# Patient Record
Sex: Female | Born: 1980 | Race: White | Hispanic: No | Marital: Married | Smoking: Current every day smoker
Health system: Southern US, Community
[De-identification: ages and names within clinical notes are randomized; demographics above are authoritative.]

## PROBLEM LIST (undated history)

## (undated) DIAGNOSIS — F329 Major depressive disorder, single episode, unspecified: Secondary | ICD-10-CM

## (undated) DIAGNOSIS — F32A Depression, unspecified: Secondary | ICD-10-CM

## (undated) DIAGNOSIS — E785 Hyperlipidemia, unspecified: Secondary | ICD-10-CM

## (undated) DIAGNOSIS — F4310 Post-traumatic stress disorder, unspecified: Secondary | ICD-10-CM

## (undated) DIAGNOSIS — F419 Anxiety disorder, unspecified: Secondary | ICD-10-CM

## (undated) DIAGNOSIS — Z9889 Other specified postprocedural states: Secondary | ICD-10-CM

## (undated) DIAGNOSIS — I420 Dilated cardiomyopathy: Secondary | ICD-10-CM

## (undated) DIAGNOSIS — E119 Type 2 diabetes mellitus without complications: Secondary | ICD-10-CM

## (undated) DIAGNOSIS — D071 Carcinoma in situ of vulva: Secondary | ICD-10-CM

## (undated) DIAGNOSIS — U071 COVID-19: Secondary | ICD-10-CM

## (undated) DIAGNOSIS — D649 Anemia, unspecified: Secondary | ICD-10-CM

## (undated) DIAGNOSIS — N809 Endometriosis, unspecified: Secondary | ICD-10-CM

## (undated) DIAGNOSIS — K219 Gastro-esophageal reflux disease without esophagitis: Secondary | ICD-10-CM

## (undated) DIAGNOSIS — Z8614 Personal history of Methicillin resistant Staphylococcus aureus infection: Secondary | ICD-10-CM

## (undated) DIAGNOSIS — I493 Ventricular premature depolarization: Secondary | ICD-10-CM

## (undated) DIAGNOSIS — T8859XA Other complications of anesthesia, initial encounter: Secondary | ICD-10-CM

## (undated) DIAGNOSIS — Q249 Congenital malformation of heart, unspecified: Secondary | ICD-10-CM

## (undated) DIAGNOSIS — Z8489 Family history of other specified conditions: Secondary | ICD-10-CM

## (undated) DIAGNOSIS — I10 Essential (primary) hypertension: Secondary | ICD-10-CM

## (undated) DIAGNOSIS — R112 Nausea with vomiting, unspecified: Secondary | ICD-10-CM

## (undated) DIAGNOSIS — J45909 Unspecified asthma, uncomplicated: Secondary | ICD-10-CM

## (undated) DIAGNOSIS — F319 Bipolar disorder, unspecified: Secondary | ICD-10-CM

## (undated) HISTORY — PX: TUBAL LIGATION: SHX77

## (undated) HISTORY — PX: APPENDECTOMY: SHX54

## (undated) HISTORY — DX: Congenital malformation of heart, unspecified: Q24.9

## (undated) HISTORY — PX: ABDOMINAL HYSTERECTOMY: SHX81

---

## 2004-04-25 ENCOUNTER — Observation Stay: Payer: Self-pay

## 2004-06-22 ENCOUNTER — Inpatient Hospital Stay: Payer: Self-pay | Admitting: Certified Nurse Midwife

## 2005-01-07 ENCOUNTER — Emergency Department: Payer: Self-pay | Admitting: Emergency Medicine

## 2005-04-14 ENCOUNTER — Emergency Department: Payer: Self-pay | Admitting: Emergency Medicine

## 2007-09-21 ENCOUNTER — Emergency Department: Payer: Self-pay | Admitting: Emergency Medicine

## 2007-09-21 ENCOUNTER — Other Ambulatory Visit: Payer: Self-pay

## 2008-02-10 ENCOUNTER — Emergency Department: Payer: Self-pay | Admitting: Emergency Medicine

## 2008-10-01 ENCOUNTER — Emergency Department: Payer: Self-pay | Admitting: Emergency Medicine

## 2008-12-01 ENCOUNTER — Emergency Department: Payer: Self-pay | Admitting: Emergency Medicine

## 2008-12-06 ENCOUNTER — Emergency Department: Payer: Self-pay | Admitting: Emergency Medicine

## 2008-12-08 ENCOUNTER — Inpatient Hospital Stay: Payer: Self-pay | Admitting: Unknown Physician Specialty

## 2009-01-04 ENCOUNTER — Ambulatory Visit: Payer: Self-pay | Admitting: Gastroenterology

## 2009-01-30 ENCOUNTER — Observation Stay: Payer: Self-pay | Admitting: Surgery

## 2009-03-06 ENCOUNTER — Ambulatory Visit: Payer: Self-pay | Admitting: Nurse Practitioner

## 2009-06-05 ENCOUNTER — Emergency Department: Payer: Self-pay | Admitting: Emergency Medicine

## 2009-07-11 ENCOUNTER — Emergency Department: Payer: Self-pay | Admitting: Emergency Medicine

## 2009-07-21 ENCOUNTER — Ambulatory Visit: Payer: Self-pay | Admitting: Nurse Practitioner

## 2009-08-28 ENCOUNTER — Emergency Department: Payer: Self-pay | Admitting: Emergency Medicine

## 2009-08-29 ENCOUNTER — Ambulatory Visit: Payer: Self-pay | Admitting: Obstetrics and Gynecology

## 2009-10-25 ENCOUNTER — Ambulatory Visit: Payer: Self-pay | Admitting: Unknown Physician Specialty

## 2009-10-26 ENCOUNTER — Ambulatory Visit: Payer: Self-pay | Admitting: Unknown Physician Specialty

## 2009-10-31 LAB — PATHOLOGY REPORT

## 2009-12-14 ENCOUNTER — Emergency Department: Payer: Self-pay | Admitting: Emergency Medicine

## 2010-01-17 ENCOUNTER — Ambulatory Visit: Payer: Self-pay | Admitting: Unknown Physician Specialty

## 2010-01-30 ENCOUNTER — Inpatient Hospital Stay: Payer: Self-pay

## 2010-02-02 LAB — PATHOLOGY REPORT

## 2010-05-02 ENCOUNTER — Emergency Department: Payer: Self-pay | Admitting: Emergency Medicine

## 2010-07-06 ENCOUNTER — Ambulatory Visit: Payer: Self-pay

## 2010-09-14 ENCOUNTER — Emergency Department: Payer: Self-pay | Admitting: Emergency Medicine

## 2010-10-29 ENCOUNTER — Emergency Department: Payer: Self-pay | Admitting: Emergency Medicine

## 2011-01-15 ENCOUNTER — Emergency Department: Payer: Self-pay | Admitting: Emergency Medicine

## 2011-02-13 ENCOUNTER — Ambulatory Visit: Payer: Self-pay | Admitting: General Practice

## 2011-02-14 ENCOUNTER — Other Ambulatory Visit: Payer: Self-pay | Admitting: Physician Assistant

## 2011-04-03 ENCOUNTER — Ambulatory Visit: Payer: Self-pay

## 2011-04-24 ENCOUNTER — Other Ambulatory Visit: Payer: Self-pay | Admitting: Physician Assistant

## 2011-05-15 ENCOUNTER — Ambulatory Visit: Payer: Self-pay | Admitting: Gastroenterology

## 2011-05-16 ENCOUNTER — Ambulatory Visit: Payer: Self-pay | Admitting: Gastroenterology

## 2011-06-07 ENCOUNTER — Emergency Department: Payer: Self-pay | Admitting: *Deleted

## 2011-06-07 LAB — CBC WITH DIFFERENTIAL/PLATELET
Basophil %: 1.1 %
Eosinophil #: 0.2 10*3/uL (ref 0.0–0.7)
MCHC: 34.2 g/dL (ref 32.0–36.0)
Monocyte #: 0.7 10*3/uL (ref 0.0–0.7)
Neutrophil %: 65.3 %
RBC: 4.34 10*6/uL (ref 3.80–5.20)
RDW: 13.2 % (ref 11.5–14.5)
WBC: 9.4 10*3/uL (ref 3.6–11.0)

## 2011-06-07 LAB — PREGNANCY, URINE: Pregnancy Test, Urine: NEGATIVE m[IU]/mL

## 2011-06-07 LAB — COMPREHENSIVE METABOLIC PANEL
Anion Gap: 7 (ref 7–16)
BUN: 12 mg/dL (ref 7–18)
Chloride: 105 mmol/L (ref 98–107)
Creatinine: 0.91 mg/dL (ref 0.60–1.30)
EGFR (African American): >60
Glucose: 102 mg/dL — ABNORMAL HIGH (ref 65–99)
Potassium: 3.9 mmol/L (ref 3.5–5.1)
Sodium: 139 mmol/L (ref 136–145)

## 2011-06-07 LAB — MONONUCLEOSIS SCREEN: Mono Test: NEGATIVE

## 2011-07-23 ENCOUNTER — Encounter: Payer: Self-pay | Admitting: Orthopedic Surgery

## 2011-08-21 ENCOUNTER — Encounter: Payer: Self-pay | Admitting: Orthopedic Surgery

## 2011-09-19 ENCOUNTER — Emergency Department: Payer: Self-pay | Admitting: Emergency Medicine

## 2011-09-19 LAB — URINALYSIS, COMPLETE
Bilirubin,UR: NEGATIVE
Blood: NEGATIVE
Glucose,UR: NEGATIVE mg/dL (ref 0–75)
Ph: 5 (ref 4.5–8.0)
RBC,UR: 1 /HPF (ref 0–5)
Squamous Epithelial: 7
WBC UR: 2 /HPF (ref 0–5)

## 2011-09-19 LAB — COMPREHENSIVE METABOLIC PANEL
Albumin: 3.8 g/dL (ref 3.4–5.0)
Alkaline Phosphatase: 77 U/L (ref 50–136)
BUN: 13 mg/dL (ref 7–18)
Chloride: 105 mmol/L (ref 98–107)
Creatinine: 0.69 mg/dL (ref 0.60–1.30)
EGFR (African American): >60
Glucose: 90 mg/dL (ref 65–99)
Osmolality: 279 (ref 275–301)
Potassium: 3.6 mmol/L (ref 3.5–5.1)
Total Protein: 7.7 g/dL (ref 6.4–8.2)

## 2011-09-19 LAB — HCG, QUANTITATIVE, PREGNANCY: Beta Hcg, Quant.: <1 m[IU]/mL — ABNORMAL LOW

## 2011-09-19 LAB — CBC
HGB: 12.8 g/dL (ref 12.0–16.0)
MCH: 28.7 pg (ref 26.0–34.0)
MCHC: 33 g/dL (ref 32.0–36.0)

## 2011-09-26 ENCOUNTER — Emergency Department: Payer: Self-pay | Admitting: *Deleted

## 2011-09-26 LAB — URINALYSIS, COMPLETE
Bacteria: NONE SEEN
Ketone: NEGATIVE
Nitrite: NEGATIVE
Ph: 5 (ref 4.5–8.0)
Protein: NEGATIVE
RBC,UR: 4 /HPF (ref 0–5)
Specific Gravity: 1.02 (ref 1.003–1.030)
Squamous Epithelial: 3

## 2011-09-26 LAB — CBC
HGB: 12.1 g/dL (ref 12.0–16.0)
MCHC: 33.5 g/dL (ref 32.0–36.0)
MCV: 87 fL (ref 80–100)
Platelet: 322 10*3/uL (ref 150–440)
RBC: 4.17 10*6/uL (ref 3.80–5.20)
RDW: 13.9 % (ref 11.5–14.5)
WBC: 10.9 10*3/uL (ref 3.6–11.0)

## 2011-09-26 LAB — COMPREHENSIVE METABOLIC PANEL
Albumin: 3.9 g/dL (ref 3.4–5.0)
Alkaline Phosphatase: 85 U/L (ref 50–136)
BUN: 11 mg/dL (ref 7–18)
Calcium, Total: 9.1 mg/dL (ref 8.5–10.1)
Chloride: 105 mmol/L (ref 98–107)
Co2: 27 mmol/L (ref 21–32)
Creatinine: 0.8 mg/dL (ref 0.60–1.30)
EGFR (Non-African Amer.): >60
Glucose: 84 mg/dL (ref 65–99)
Osmolality: 278 (ref 275–301)
Potassium: 3.8 mmol/L (ref 3.5–5.1)
SGOT(AST): 28 U/L (ref 15–37)

## 2011-09-26 LAB — MONONUCLEOSIS SCREEN: Mono Test: NEGATIVE

## 2011-11-28 ENCOUNTER — Other Ambulatory Visit: Payer: Self-pay

## 2011-11-28 LAB — CBC WITH DIFFERENTIAL/PLATELET
Basophil #: 0.1 10*3/uL (ref 0.0–0.1)
Eosinophil #: 0.1 10*3/uL (ref 0.0–0.7)
Lymphocyte %: 33.8 %
MCHC: 33.5 g/dL (ref 32.0–36.0)
Monocyte #: 0.5 x10 3/mm (ref 0.2–0.9)
Neutrophil %: 58.4 %
RDW: 13.6 % (ref 11.5–14.5)

## 2011-11-28 LAB — LIPID PANEL
HDL Cholesterol: 36 mg/dL — ABNORMAL LOW (ref 40–60)
Triglycerides: 149 mg/dL (ref 0–200)
VLDL Cholesterol, Calc: 30 mg/dL (ref 5–40)

## 2011-11-28 LAB — COMPREHENSIVE METABOLIC PANEL
Albumin: 3.8 g/dL (ref 3.4–5.0)
Alkaline Phosphatase: 92 U/L (ref 50–136)
BUN: 10 mg/dL (ref 7–18)
Calcium, Total: 9 mg/dL (ref 8.5–10.1)
Creatinine: 0.74 mg/dL (ref 0.60–1.30)
Glucose: 84 mg/dL (ref 65–99)
Potassium: 4.1 mmol/L (ref 3.5–5.1)
SGOT(AST): 21 U/L (ref 15–37)
SGPT (ALT): 31 U/L (ref 12–78)
Sodium: 141 mmol/L (ref 136–145)
Total Protein: 7.6 g/dL (ref 6.4–8.2)

## 2011-12-12 ENCOUNTER — Ambulatory Visit: Payer: Self-pay | Admitting: Orthopedic Surgery

## 2011-12-16 ENCOUNTER — Emergency Department: Payer: Self-pay | Admitting: Emergency Medicine

## 2012-03-10 ENCOUNTER — Emergency Department: Payer: Self-pay | Admitting: Emergency Medicine

## 2012-03-10 LAB — CBC
HGB: 12.5 g/dL (ref 12.0–16.0)
MCH: 30.3 pg (ref 26.0–34.0)
MCHC: 34.7 g/dL (ref 32.0–36.0)
Platelet: 262 10*3/uL (ref 150–440)
RDW: 14.1 % (ref 11.5–14.5)

## 2012-03-10 LAB — COMPREHENSIVE METABOLIC PANEL
BUN: 11 mg/dL (ref 7–18)
Bilirubin,Total: 0.3 mg/dL (ref 0.2–1.0)
Chloride: 105 mmol/L (ref 98–107)
Co2: 29 mmol/L (ref 21–32)
Creatinine: 0.66 mg/dL (ref 0.60–1.30)
EGFR (African American): >60
EGFR (Non-African Amer.): >60
Glucose: 101 mg/dL — ABNORMAL HIGH (ref 65–99)
Osmolality: 281 (ref 275–301)
Potassium: 3.8 mmol/L (ref 3.5–5.1)
Sodium: 141 mmol/L (ref 136–145)

## 2012-03-10 LAB — URINALYSIS, COMPLETE
Bilirubin,UR: NEGATIVE
Blood: NEGATIVE
Glucose,UR: NEGATIVE mg/dL (ref 0–75)
Specific Gravity: 1.027 (ref 1.003–1.030)
Squamous Epithelial: 1

## 2012-03-10 LAB — LIPASE, BLOOD: Lipase: 152 U/L (ref 73–393)

## 2012-06-02 ENCOUNTER — Other Ambulatory Visit: Payer: Self-pay | Admitting: Psychiatry

## 2012-06-02 LAB — COMPREHENSIVE METABOLIC PANEL
Anion Gap: 6 — ABNORMAL LOW (ref 7–16)
BUN: 12 mg/dL (ref 7–18)
Calcium, Total: 8.6 mg/dL (ref 8.5–10.1)
Co2: 26 mmol/L (ref 21–32)
EGFR (African American): >60
EGFR (Non-African Amer.): >60
SGOT(AST): 30 U/L (ref 15–37)
SGPT (ALT): 47 U/L (ref 12–78)

## 2012-06-02 LAB — CBC WITH DIFFERENTIAL/PLATELET
HCT: 34.8 % — ABNORMAL LOW (ref 35.0–47.0)
HGB: 11.9 g/dL — ABNORMAL LOW (ref 12.0–16.0)
Lymphocyte #: 2.6 10*3/uL (ref 1.0–3.6)
Lymphocyte %: 31.4 %
MCH: 29.3 pg (ref 26.0–34.0)
MCHC: 34.2 g/dL (ref 32.0–36.0)
Monocyte #: 0.8 x10 3/mm (ref 0.2–0.9)
Monocyte %: 9.4 %
Neutrophil #: 4.7 10*3/uL (ref 1.4–6.5)
Neutrophil %: 56.7 %
RDW: 13.9 % (ref 11.5–14.5)

## 2012-06-02 LAB — LIPID PANEL
HDL Cholesterol: 32 mg/dL — ABNORMAL LOW (ref 40–60)
Ldl Cholesterol, Calc: 94 mg/dL (ref 0–100)
Triglycerides: 291 mg/dL — ABNORMAL HIGH (ref 0–200)
VLDL Cholesterol, Calc: 58 mg/dL — ABNORMAL HIGH (ref 5–40)

## 2012-06-02 LAB — VALPROIC ACID LEVEL: Valproic Acid: 73 ug/mL

## 2012-08-12 ENCOUNTER — Emergency Department: Payer: Self-pay | Admitting: Emergency Medicine

## 2012-08-12 LAB — COMPREHENSIVE METABOLIC PANEL
Albumin: 3.3 g/dL — ABNORMAL LOW (ref 3.4–5.0)
Alkaline Phosphatase: 90 U/L (ref 50–136)
Bilirubin,Total: 0.4 mg/dL (ref 0.2–1.0)
Calcium, Total: 8.6 mg/dL (ref 8.5–10.1)
Chloride: 107 mmol/L (ref 98–107)
Co2: 22 mmol/L (ref 21–32)
EGFR (African American): >60
EGFR (Non-African Amer.): >60
Glucose: 99 mg/dL (ref 65–99)
Osmolality: 273 (ref 275–301)
Potassium: 4.3 mmol/L (ref 3.5–5.1)
SGOT(AST): 51 U/L — ABNORMAL HIGH (ref 15–37)
SGPT (ALT): 45 U/L (ref 12–78)
Total Protein: 7.2 g/dL (ref 6.4–8.2)

## 2012-08-12 LAB — URINALYSIS, COMPLETE
Bilirubin,UR: NEGATIVE
Blood: NEGATIVE
Ketone: NEGATIVE
Leukocyte Esterase: NEGATIVE
Nitrite: NEGATIVE
Protein: NEGATIVE
WBC UR: <1 /HPF (ref 0–5)

## 2012-08-12 LAB — CBC
MCH: 28.6 pg (ref 26.0–34.0)
MCHC: 33.6 g/dL (ref 32.0–36.0)
RBC: 4.2 10*6/uL (ref 3.80–5.20)
RDW: 14.4 % (ref 11.5–14.5)
WBC: 8.9 10*3/uL (ref 3.6–11.0)

## 2012-08-12 LAB — LIPASE, BLOOD: Lipase: 146 U/L (ref 73–393)

## 2012-08-13 ENCOUNTER — Encounter (HOSPITAL_COMMUNITY): Payer: Self-pay | Admitting: *Deleted

## 2012-08-13 ENCOUNTER — Emergency Department (HOSPITAL_COMMUNITY)
Admission: EM | Admit: 2012-08-13 | Discharge: 2012-08-14 | Disposition: A | Discharge status: Home / Self Care | Payer: PRIVATE HEALTH INSURANCE | Attending: Emergency Medicine | Admitting: Emergency Medicine

## 2012-08-13 DIAGNOSIS — Z9851 Tubal ligation status: Secondary | ICD-10-CM | POA: Insufficient documentation

## 2012-08-13 DIAGNOSIS — M79609 Pain in unspecified limb: Secondary | ICD-10-CM | POA: Insufficient documentation

## 2012-08-13 DIAGNOSIS — R609 Edema, unspecified: Secondary | ICD-10-CM | POA: Insufficient documentation

## 2012-08-13 DIAGNOSIS — R42 Dizziness and giddiness: Secondary | ICD-10-CM | POA: Insufficient documentation

## 2012-08-13 DIAGNOSIS — R109 Unspecified abdominal pain: Secondary | ICD-10-CM

## 2012-08-13 DIAGNOSIS — R51 Headache: Secondary | ICD-10-CM | POA: Insufficient documentation

## 2012-08-13 DIAGNOSIS — M542 Cervicalgia: Secondary | ICD-10-CM | POA: Insufficient documentation

## 2012-08-13 DIAGNOSIS — F319 Bipolar disorder, unspecified: Secondary | ICD-10-CM | POA: Insufficient documentation

## 2012-08-13 DIAGNOSIS — F411 Generalized anxiety disorder: Secondary | ICD-10-CM | POA: Insufficient documentation

## 2012-08-13 DIAGNOSIS — Z8619 Personal history of other infectious and parasitic diseases: Secondary | ICD-10-CM | POA: Insufficient documentation

## 2012-08-13 DIAGNOSIS — Z8742 Personal history of other diseases of the female genital tract: Secondary | ICD-10-CM | POA: Insufficient documentation

## 2012-08-13 DIAGNOSIS — R112 Nausea with vomiting, unspecified: Secondary | ICD-10-CM | POA: Insufficient documentation

## 2012-08-13 DIAGNOSIS — F3289 Other specified depressive episodes: Secondary | ICD-10-CM | POA: Insufficient documentation

## 2012-08-13 DIAGNOSIS — Z9089 Acquired absence of other organs: Secondary | ICD-10-CM | POA: Insufficient documentation

## 2012-08-13 DIAGNOSIS — J45909 Unspecified asthma, uncomplicated: Secondary | ICD-10-CM | POA: Insufficient documentation

## 2012-08-13 DIAGNOSIS — F329 Major depressive disorder, single episode, unspecified: Secondary | ICD-10-CM | POA: Insufficient documentation

## 2012-08-13 DIAGNOSIS — Z90710 Acquired absence of both cervix and uterus: Secondary | ICD-10-CM | POA: Insufficient documentation

## 2012-08-13 HISTORY — DX: Endometriosis, unspecified: N80.9

## 2012-08-13 HISTORY — DX: Major depressive disorder, single episode, unspecified: F32.9

## 2012-08-13 HISTORY — DX: Anxiety disorder, unspecified: F41.9

## 2012-08-13 HISTORY — DX: Depression, unspecified: F32.A

## 2012-08-13 HISTORY — DX: Unspecified asthma, uncomplicated: J45.909

## 2012-08-13 HISTORY — DX: Bipolar disorder, unspecified: F31.9

## 2012-08-13 LAB — URINALYSIS, ROUTINE W REFLEX MICROSCOPIC
Glucose, UA: NEGATIVE mg/dL
Leukocytes, UA: NEGATIVE
Nitrite: NEGATIVE
Specific Gravity, Urine: 1.016 (ref 1.005–1.030)
pH: 6.5 (ref 5.0–8.0)

## 2012-08-13 NOTE — ED Notes (Signed)
Pt c/o alternating side pain; mainly right flank; x 3 days; started with n/v/d; headache; dizziness; c/o left sided neck stiffness-- can touch chin to chest; minimal pain touching chin to left shoulder; denies dysuria or hematuria

## 2012-08-14 ENCOUNTER — Emergency Department (HOSPITAL_COMMUNITY): Payer: PRIVATE HEALTH INSURANCE

## 2012-08-14 LAB — COMPREHENSIVE METABOLIC PANEL
Albumin: 3.4 g/dL — ABNORMAL LOW (ref 3.5–5.2)
Alkaline Phosphatase: 92 U/L (ref 39–117)
BUN: 7 mg/dL (ref 6–23)
CO2: 25 mEq/L (ref 19–32)
Chloride: 103 mEq/L (ref 96–112)
Glucose, Bld: 121 mg/dL — ABNORMAL HIGH (ref 70–99)
Potassium: 3.9 mEq/L (ref 3.5–5.1)
Total Bilirubin: 0.2 mg/dL — ABNORMAL LOW (ref 0.3–1.2)

## 2012-08-14 LAB — CBC WITH DIFFERENTIAL/PLATELET
Basophils Relative: 0 % (ref 0–1)
Hemoglobin: 11.4 g/dL — ABNORMAL LOW (ref 12.0–15.0)
Lymphocytes Relative: 34 % (ref 12–46)
Lymphs Abs: 3.6 10*3/uL (ref 0.7–4.0)
Monocytes Relative: 7 % (ref 3–12)
Neutro Abs: 5.9 10*3/uL (ref 1.7–7.7)
Neutrophils Relative %: 56 % (ref 43–77)
RBC: 3.98 MIL/uL (ref 3.87–5.11)
WBC: 10.6 10*3/uL — ABNORMAL HIGH (ref 4.0–10.5)

## 2012-08-14 MED ORDER — MORPHINE SULFATE 4 MG/ML IJ SOLN
4.0000 mg | Freq: Once | INTRAMUSCULAR | Status: AC
  Administered 2012-08-14: 4 mg via INTRAVENOUS
  Filled 2012-08-14: qty 1

## 2012-08-14 MED ORDER — HYDROCODONE-ACETAMINOPHEN 5-325 MG PO TABS: 2.0000 | ORAL_TABLET | ORAL | Status: DC | PRN

## 2012-08-14 MED ORDER — OMEPRAZOLE 20 MG PO CPDR: 20.0000 mg | DELAYED_RELEASE_CAPSULE | Freq: Every day | ORAL | Status: DC

## 2012-08-14 MED ORDER — HYDROMORPHONE HCL PF 1 MG/ML IJ SOLN
1.0000 mg | Freq: Once | INTRAMUSCULAR | Status: AC
  Administered 2012-08-14: 1 mg via INTRAVENOUS
  Filled 2012-08-14: qty 1

## 2012-08-14 MED ORDER — PROMETHAZINE HCL 25 MG/ML IJ SOLN
25.0000 mg | Freq: Once | INTRAMUSCULAR | Status: AC
  Administered 2012-08-14: 25 mg via INTRAVENOUS
  Filled 2012-08-14: qty 1

## 2012-08-14 MED ORDER — ONDANSETRON HCL 4 MG/2ML IJ SOLN
4.0000 mg | Freq: Once | INTRAMUSCULAR | Status: AC
  Administered 2012-08-14: 4 mg via INTRAVENOUS
  Filled 2012-08-14: qty 2

## 2012-08-14 MED ORDER — ONDANSETRON HCL 8 MG PO TABS: 8.0000 mg | ORAL_TABLET | Freq: Three times a day (TID) | ORAL | Status: DC | PRN

## 2012-08-14 MED ORDER — SODIUM CHLORIDE 0.9 % IV BOLUS (SEPSIS)
1000.0000 mL | Freq: Once | INTRAVENOUS | Status: AC
  Administered 2012-08-14: 1000 mL via INTRAVENOUS

## 2012-08-14 MED ORDER — SUCRALFATE 1 G PO TABS: 1.0000 g | ORAL_TABLET | Freq: Four times a day (QID) | ORAL | Status: DC

## 2012-08-14 NOTE — ED Provider Notes (Signed)
History     CSN: [id number]  Arrival date & time 08/13/12  2240   First MD Initiated Contact with Patient 08/14/12 0201      Chief Complaint  Patient presents with  . Abdominal Pain    (Consider location/radiation/quality/duration/timing/severity/associated sxs/prior treatment) HPI History provided by pt.   Pt has had severe upper abdominal pain, that migraines from left to right side, for the past 2 days.  Associated w/ N/V that is aggravated by eating, anorexia and diarrhea that has resolved.  Denies fever and GU sx.  Was evaluated at [facility] 2 days ago and CT abd/pelvis showed an enlarged liver.  Was prescribed vicodin and referred to GI.  Went to [facility] last night for second opinion, no imaging was performed and was diagnosed w/ viral infection.  She is here today because she is tired of the pain and can't see GI until end of May.  Past abdominal surgeries include partial hysterectomy and appendectomy. Also c/o pain on top of head.  Severe upon waking this morning but currently minimal.  Associated w/ dizziness; no vision changes, extremity weakness/paresthesias.  Also c/o pain in posterior neck.  Denies trauma to head/neck.   Past Medical History  Diagnosis Date  . Bipolar 1 disorder   . Depression   . Anxiety   . Endometriosis   . Asthma     Past Surgical History  Procedure Laterality Date  . Appendectomy    . Abdominal hysterectomy    . Tubal ligation      No family history on file.  History  Substance Use Topics  . Smoking status: Never Smoker   . Smokeless tobacco: Not on file  . Alcohol Use: No    OB History   Grav Para Term Preterm Abortions TAB SAB Ect Mult Living                  Review of Systems  All other systems reviewed and are negative.    Allergies  Claritin-d 12 hour; Percocet; and Tape  Home Medications   Current Outpatient Rx  Name  Route  Sig  Dispense  Refill  . clonazePAM (KLONOPIN) 1 MG tablet   Oral   Take 1 mg  by mouth 3 (three) times daily.         Marland Kitchen escitalopram (LEXAPRO) 10 MG tablet   Oral   Take 20 mg by mouth daily.         Marland Kitchen HYDROcodone-acetaminophen (NORCO/VICODIN) 5-325 MG per tablet   Oral   Take 1 tablet by mouth every 6 (six) hours as needed for pain.         . promethazine (PHENERGAN) 25 MG tablet   Oral   Take 25 mg by mouth every 8 (eight) hours as needed for nausea.         Marland Kitchen topiramate (TOPAMAX) 25 MG tablet   Oral   Take 25 mg by mouth 3 (three) times daily.         . ziprasidone (GEODON) 40 MG capsule   Oral   Take 40 mg by mouth 2 (two) times daily with a meal.           BP 120/76  Pulse 84  Temp(Src) 98.6 F (37 C) (Oral)  Resp 18  SpO2 99%  Physical Exam  Nursing note and vitals reviewed. Constitutional: She is oriented to person, place, and time. She appears well-developed and well-nourished. No distress.  HENT:  Head: Normocephalic and atraumatic.  Eyes:  Normal appearance  Neck: Normal range of motion.  Cardiovascular: Normal rate and regular rhythm.   Pulmonary/Chest: Effort normal and breath sounds normal. No respiratory distress.  Abdominal: Soft. Bowel sounds are normal. She exhibits no distension and no mass. There is no tenderness. There is no rebound and no guarding.  Epigastric and especially RUQ ttp   Genitourinary:  No CVA tenderness  Musculoskeletal: Normal range of motion.  Neurological: She is alert and oriented to person, place, and time.  Skin: Skin is warm and dry. No rash noted.  Psychiatric: She has a normal mood and affect. Her behavior is normal.    ED Course  Procedures (including critical care time)  Labs Reviewed  CBC WITH DIFFERENTIAL - Abnormal; Notable for the following:    WBC 10.6 (*)    Hemoglobin 11.4 (*)    HCT 34.4 (*)    All other components within normal limits  COMPREHENSIVE METABOLIC PANEL - Abnormal; Notable for the following:    Glucose, Bld 121 (*)    Albumin 3.4 (*)    Total Bilirubin  0.2 (*)    All other components within normal limits  URINALYSIS, ROUTINE W REFLEX MICROSCOPIC  PREGNANCY, URINE   No results found.   1. Abdominal pain   2. Peripheral edema       MDM  32yo F presents w/ RUQ pain + N/V x 2 days.  Had diarrhea but resolved.  Also c/o headache, neck pain and edema all four extremities that started simultaneously.  On exam, afebrile, uncomfortable appearing, tearful, severe RUQ ttp, no focal neuro deficits or meningeal signs, trace non-pitting edema all extremities.  Labs unremarkable.  US abdomen negative for cholelithiasis.  All results discussed w/ patient.  This is her third ED evaluation in three days.  [place] obtained CT that showed hepatomegaly and she was referred to GI.  [facility] diagnosed clinically w/ viral infection.   Cause of edema unclear.  Will refer to [facility] for PCP f/u.  Pain, nausea and headache resolved w/ IVF, phenergan and dilaudid.   D/c'd home w/ more vicodin, zofran and carafate/prilosec to trial for possible gastritis.  Referred to [facility] to see if she can get f/u sooner.  Return precautions discussed.          Otilio Miu, PA-C 08/14/12 [phone]

## 2012-08-14 NOTE — ED Provider Notes (Signed)
Medical screening examination/treatment/procedure(s) were performed by non-physician practitioner and as supervising physician I was immediately available for consultation/collaboration.  Sunnie Nielsen, MD 08/14/12 [phone]

## 2012-08-15 ENCOUNTER — Telehealth (HOSPITAL_COMMUNITY): Payer: Self-pay | Admitting: Emergency Medicine

## 2012-08-16 ENCOUNTER — Emergency Department: Payer: Self-pay | Admitting: Emergency Medicine

## 2012-08-16 LAB — COMPREHENSIVE METABOLIC PANEL
Albumin: 3.3 g/dL — ABNORMAL LOW (ref 3.4–5.0)
Anion Gap: 8 (ref 7–16)
BUN: 10 mg/dL (ref 7–18)
Creatinine: 0.71 mg/dL (ref 0.60–1.30)
Potassium: 3.7 mmol/L (ref 3.5–5.1)
SGOT(AST): 30 U/L (ref 15–37)
SGPT (ALT): 42 U/L (ref 12–78)
Total Protein: 6.8 g/dL (ref 6.4–8.2)

## 2012-08-16 LAB — CBC
MCHC: 33.8 g/dL (ref 32.0–36.0)
MCV: 86 fL (ref 80–100)
RDW: 14.3 % (ref 11.5–14.5)

## 2012-08-27 ENCOUNTER — Emergency Department: Payer: Self-pay | Admitting: Emergency Medicine

## 2012-08-27 LAB — COMPREHENSIVE METABOLIC PANEL
Albumin: 3.8 g/dL (ref 3.4–5.0)
Anion Gap: 7 (ref 7–16)
BUN: 10 mg/dL (ref 7–18)
Bilirubin,Total: 0.2 mg/dL (ref 0.2–1.0)
Calcium, Total: 8.6 mg/dL (ref 8.5–10.1)
Chloride: 108 mmol/L — ABNORMAL HIGH (ref 98–107)
Creatinine: 0.86 mg/dL (ref 0.60–1.30)
EGFR (African American): >60
Osmolality: 279 (ref 275–301)
Sodium: 140 mmol/L (ref 136–145)
Total Protein: 7.8 g/dL (ref 6.4–8.2)

## 2012-08-27 LAB — CBC
HGB: 12.5 g/dL (ref 12.0–16.0)
MCHC: 33.4 g/dL (ref 32.0–36.0)
Platelet: 341 10*3/uL (ref 150–440)
RBC: 4.47 10*6/uL (ref 3.80–5.20)
RDW: 14.2 % (ref 11.5–14.5)
WBC: 12.6 10*3/uL — ABNORMAL HIGH (ref 3.6–11.0)

## 2012-08-27 LAB — URINALYSIS, COMPLETE
Bilirubin,UR: NEGATIVE
Glucose,UR: NEGATIVE mg/dL (ref 0–75)
Leukocyte Esterase: NEGATIVE
Nitrite: NEGATIVE
RBC,UR: 2 /HPF (ref 0–5)
Specific Gravity: 1.031 (ref 1.003–1.030)
WBC UR: 2 /HPF (ref 0–5)

## 2012-09-01 ENCOUNTER — Ambulatory Visit: Payer: Self-pay | Admitting: Emergency Medicine

## 2012-11-16 ENCOUNTER — Emergency Department: Payer: Self-pay | Admitting: Emergency Medicine

## 2012-11-16 LAB — URINALYSIS, COMPLETE
Leukocyte Esterase: NEGATIVE
Ph: 5 (ref 4.5–8.0)
Protein: NEGATIVE
Specific Gravity: 1.021 (ref 1.003–1.030)
Squamous Epithelial: <1

## 2012-12-22 ENCOUNTER — Emergency Department: Payer: Self-pay | Admitting: Emergency Medicine

## 2012-12-22 LAB — BASIC METABOLIC PANEL
Anion Gap: 6 — ABNORMAL LOW (ref 7–16)
BUN: 8 mg/dL (ref 7–18)
Calcium, Total: 8.8 mg/dL (ref 8.5–10.1)
Chloride: 110 mmol/L — ABNORMAL HIGH (ref 98–107)
Creatinine: 0.89 mg/dL (ref 0.60–1.30)
EGFR (African American): >60
Glucose: 97 mg/dL (ref 65–99)
Osmolality: 280 (ref 275–301)
Potassium: 3.5 mmol/L (ref 3.5–5.1)

## 2012-12-22 LAB — CBC
HCT: 37.5 % (ref 35.0–47.0)
HGB: 12.9 g/dL (ref 12.0–16.0)
MCH: 29.3 pg (ref 26.0–34.0)
MCHC: 34.4 g/dL (ref 32.0–36.0)
Platelet: 333 10*3/uL (ref 150–440)

## 2012-12-22 LAB — TROPONIN I: Troponin-I: <0.02 ng/mL

## 2013-02-08 ENCOUNTER — Inpatient Hospital Stay: Payer: Self-pay | Admitting: Psychiatry

## 2013-02-08 LAB — COMPREHENSIVE METABOLIC PANEL
Albumin: 3.5 g/dL (ref 3.4–5.0)
Alkaline Phosphatase: 101 U/L (ref 50–136)
BUN: 8 mg/dL (ref 7–18)
Calcium, Total: 8.9 mg/dL (ref 8.5–10.1)
Chloride: 109 mmol/L — ABNORMAL HIGH (ref 98–107)
Co2: 23 mmol/L (ref 21–32)
Creatinine: 0.8 mg/dL (ref 0.60–1.30)
EGFR (Non-African Amer.): >60
Glucose: 138 mg/dL — ABNORMAL HIGH (ref 65–99)
Osmolality: 278 (ref 275–301)
Total Protein: 7.7 g/dL (ref 6.4–8.2)

## 2013-02-08 LAB — DRUG SCREEN, URINE
Amphetamines, Ur Screen: NEGATIVE (ref ?–1000)
Barbiturates, Ur Screen: NEGATIVE (ref ?–200)
Benzodiazepine, Ur Scrn: POSITIVE (ref ?–200)
Cannabinoid 50 Ng, Ur ~~LOC~~: NEGATIVE (ref ?–50)
Cocaine Metabolite,Ur ~~LOC~~: NEGATIVE (ref ?–300)
MDMA (Ecstasy)Ur Screen: NEGATIVE (ref ?–500)
Methadone, Ur Screen: NEGATIVE (ref ?–300)
Opiate, Ur Screen: NEGATIVE (ref ?–300)

## 2013-02-08 LAB — URINALYSIS, COMPLETE
Bilirubin,UR: NEGATIVE
Glucose,UR: NEGATIVE mg/dL (ref 0–75)
Leukocyte Esterase: NEGATIVE
Ph: 9 (ref 4.5–8.0)
Squamous Epithelial: 1
WBC UR: <1 /HPF (ref 0–5)

## 2013-02-08 LAB — TROPONIN I: Troponin-I: <0.02 ng/mL

## 2013-02-08 LAB — CBC
MCH: 29.3 pg (ref 26.0–34.0)
MCHC: 34.3 g/dL (ref 32.0–36.0)
MCV: 85 fL (ref 80–100)
Platelet: 300 10*3/uL (ref 150–440)
RBC: 4.2 10*6/uL (ref 3.80–5.20)
RDW: 13.8 % (ref 11.5–14.5)

## 2013-02-08 LAB — ETHANOL: Ethanol: <3 mg/dL

## 2013-02-08 LAB — PREGNANCY, URINE: Pregnancy Test, Urine: NEGATIVE m[IU]/mL

## 2013-02-12 LAB — COMPREHENSIVE METABOLIC PANEL
Alkaline Phosphatase: 86 U/L (ref 50–136)
Anion Gap: 8 (ref 7–16)
BUN: 9 mg/dL (ref 7–18)
Bilirubin,Total: 0.2 mg/dL (ref 0.2–1.0)
Calcium, Total: 8.4 mg/dL — ABNORMAL LOW (ref 8.5–10.1)
Creatinine: 0.83 mg/dL (ref 0.60–1.30)
EGFR (African American): >60
EGFR (Non-African Amer.): >60
Glucose: 90 mg/dL (ref 65–99)
Osmolality: 276 (ref 275–301)
SGPT (ALT): 29 U/L (ref 12–78)
Sodium: 139 mmol/L (ref 136–145)
Total Protein: 6.9 g/dL (ref 6.4–8.2)

## 2013-03-06 ENCOUNTER — Emergency Department: Payer: Self-pay | Admitting: Emergency Medicine

## 2013-03-06 LAB — URINALYSIS, COMPLETE
Bilirubin,UR: NEGATIVE
Glucose,UR: NEGATIVE mg/dL (ref 0–75)
Hyaline Cast: 2
Leukocyte Esterase: NEGATIVE
Ph: 6 (ref 4.5–8.0)
Protein: NEGATIVE
RBC,UR: 1 /HPF (ref 0–5)
Specific Gravity: 1.014 (ref 1.003–1.030)
Squamous Epithelial: 1
WBC UR: 3 /HPF (ref 0–5)

## 2013-06-08 ENCOUNTER — Emergency Department: Payer: Self-pay | Admitting: Emergency Medicine

## 2013-06-08 LAB — URINALYSIS, COMPLETE
Bilirubin,UR: NEGATIVE
Blood: NEGATIVE
GLUCOSE, UR: NEGATIVE mg/dL (ref 0–75)
Ketone: NEGATIVE
LEUKOCYTE ESTERASE: NEGATIVE
NITRITE: NEGATIVE
PH: 5 (ref 4.5–8.0)
PROTEIN: NEGATIVE
RBC,UR: 1 /HPF (ref 0–5)
Specific Gravity: 1.019 (ref 1.003–1.030)
Squamous Epithelial: 1
WBC UR: 3 /HPF (ref 0–5)

## 2013-06-08 LAB — CBC
HCT: 39.6 % (ref 35.0–47.0)
HGB: 12.6 g/dL (ref 12.0–16.0)
MCH: 27.3 pg (ref 26.0–34.0)
MCHC: 31.9 g/dL — ABNORMAL LOW (ref 32.0–36.0)
MCV: 86 fL (ref 80–100)
PLATELETS: 339 10*3/uL (ref 150–440)
RBC: 4.63 10*6/uL (ref 3.80–5.20)
RDW: 14.2 % (ref 11.5–14.5)
WBC: 12.9 10*3/uL — ABNORMAL HIGH (ref 3.6–11.0)

## 2013-06-08 LAB — BASIC METABOLIC PANEL
ANION GAP: 6 — AB (ref 7–16)
BUN: 8 mg/dL (ref 7–18)
CALCIUM: 9.1 mg/dL (ref 8.5–10.1)
Chloride: 109 mmol/L — ABNORMAL HIGH (ref 98–107)
Co2: 24 mmol/L (ref 21–32)
Creatinine: 0.84 mg/dL (ref 0.60–1.30)
GLUCOSE: 92 mg/dL (ref 65–99)
OSMOLALITY: 276 (ref 275–301)
Potassium: 3.5 mmol/L (ref 3.5–5.1)
Sodium: 139 mmol/L (ref 136–145)

## 2013-06-08 LAB — TROPONIN I
Troponin-I: <0.02 ng/mL
Troponin-I: <0.02 ng/mL

## 2013-06-09 LAB — DRUG SCREEN, URINE
Amphetamines, Ur Screen: NEGATIVE (ref ?–1000)
BENZODIAZEPINE, UR SCRN: POSITIVE (ref ?–200)
Barbiturates, Ur Screen: NEGATIVE (ref ?–200)
COCAINE METABOLITE, UR ~~LOC~~: NEGATIVE (ref ?–300)
Cannabinoid 50 Ng, Ur ~~LOC~~: NEGATIVE (ref ?–50)
MDMA (ECSTASY) UR SCREEN: NEGATIVE (ref ?–500)
Methadone, Ur Screen: NEGATIVE (ref ?–300)
OPIATE, UR SCREEN: NEGATIVE (ref ?–300)
Phencyclidine (PCP) Ur S: NEGATIVE (ref ?–25)
Tricyclic, Ur Screen: NEGATIVE (ref ?–1000)

## 2013-07-05 ENCOUNTER — Emergency Department: Payer: Self-pay | Admitting: Emergency Medicine

## 2013-07-05 LAB — CBC WITH DIFFERENTIAL/PLATELET
BASOS ABS: 0.1 10*3/uL (ref 0.0–0.1)
Basophil %: 0.8 %
EOS ABS: 0.3 10*3/uL (ref 0.0–0.7)
Eosinophil %: 2.1 %
HCT: 39.1 % (ref 35.0–47.0)
HGB: 12 g/dL (ref 12.0–16.0)
LYMPHS ABS: 3.9 10*3/uL — AB (ref 1.0–3.6)
LYMPHS PCT: 27.8 %
MCH: 26.7 pg (ref 26.0–34.0)
MCHC: 30.9 g/dL — AB (ref 32.0–36.0)
MCV: 87 fL (ref 80–100)
MONOS PCT: 5.7 %
Monocyte #: 0.8 x10 3/mm (ref 0.2–0.9)
Neutrophil #: 9 10*3/uL — ABNORMAL HIGH (ref 1.4–6.5)
Neutrophil %: 63.6 %
Platelet: 353 10*3/uL (ref 150–440)
RBC: 4.51 10*6/uL (ref 3.80–5.20)
RDW: 14.1 % (ref 11.5–14.5)
WBC: 14.1 10*3/uL — AB (ref 3.6–11.0)

## 2013-07-05 LAB — COMPREHENSIVE METABOLIC PANEL
Albumin: 3.6 g/dL (ref 3.4–5.0)
Alkaline Phosphatase: 88 U/L
Anion Gap: 6 — ABNORMAL LOW (ref 7–16)
BUN: 7 mg/dL (ref 7–18)
Bilirubin,Total: 0.3 mg/dL (ref 0.2–1.0)
Calcium, Total: 8.8 mg/dL (ref 8.5–10.1)
Chloride: 109 mmol/L — ABNORMAL HIGH (ref 98–107)
Co2: 26 mmol/L (ref 21–32)
Creatinine: 0.89 mg/dL (ref 0.60–1.30)
EGFR (African American): >60
EGFR (Non-African Amer.): >60
Glucose: 82 mg/dL (ref 65–99)
Osmolality: 278 (ref 275–301)
Potassium: 3.8 mmol/L (ref 3.5–5.1)
SGOT(AST): 17 U/L (ref 15–37)
SGPT (ALT): 34 U/L (ref 12–78)
Sodium: 141 mmol/L (ref 136–145)
Total Protein: 7.8 g/dL (ref 6.4–8.2)

## 2013-07-05 LAB — URINALYSIS, COMPLETE
BLOOD: NEGATIVE
Bacteria: NONE SEEN
Bilirubin,UR: NEGATIVE
GLUCOSE, UR: NEGATIVE mg/dL (ref 0–75)
Ketone: NEGATIVE
Leukocyte Esterase: NEGATIVE
Nitrite: NEGATIVE
Ph: 6 (ref 4.5–8.0)
Protein: NEGATIVE
RBC,UR: <1 /HPF (ref 0–5)
SPECIFIC GRAVITY: 1.018 (ref 1.003–1.030)
WBC UR: 2 /HPF (ref 0–5)

## 2013-07-05 LAB — LIPASE, BLOOD: LIPASE: 99 U/L (ref 73–393)

## 2013-09-01 LAB — COMPREHENSIVE METABOLIC PANEL
ALBUMIN: 3.4 g/dL (ref 3.4–5.0)
ALK PHOS: 95 U/L
ALT: 33 U/L (ref 12–78)
Anion Gap: 6 — ABNORMAL LOW (ref 7–16)
BILIRUBIN TOTAL: 0.2 mg/dL (ref 0.2–1.0)
BUN: 7 mg/dL (ref 7–18)
CHLORIDE: 109 mmol/L — AB (ref 98–107)
CO2: 25 mmol/L (ref 21–32)
CREATININE: 0.84 mg/dL (ref 0.60–1.30)
Calcium, Total: 8.8 mg/dL (ref 8.5–10.1)
Glucose: 157 mg/dL — ABNORMAL HIGH (ref 65–99)
OSMOLALITY: 281 (ref 275–301)
Potassium: 3.6 mmol/L (ref 3.5–5.1)
SGOT(AST): 32 U/L (ref 15–37)
SODIUM: 140 mmol/L (ref 136–145)
TOTAL PROTEIN: 7.5 g/dL (ref 6.4–8.2)

## 2013-09-01 LAB — CBC
HCT: 36.8 % (ref 35.0–47.0)
HGB: 12 g/dL (ref 12.0–16.0)
MCH: 28.1 pg (ref 26.0–34.0)
MCHC: 32.6 g/dL (ref 32.0–36.0)
MCV: 86 fL (ref 80–100)
Platelet: 312 10*3/uL (ref 150–440)
RBC: 4.27 10*6/uL (ref 3.80–5.20)
RDW: 14.7 % — ABNORMAL HIGH (ref 11.5–14.5)
WBC: 11.7 10*3/uL — AB (ref 3.6–11.0)

## 2013-09-01 LAB — ETHANOL: Ethanol: <3 mg/dL

## 2013-09-01 LAB — URINALYSIS, COMPLETE
BLOOD: NEGATIVE
Bilirubin,UR: NEGATIVE
Glucose,UR: NEGATIVE mg/dL (ref 0–75)
Ketone: NEGATIVE
Leukocyte Esterase: NEGATIVE
NITRITE: NEGATIVE
PH: 6 (ref 4.5–8.0)
Protein: NEGATIVE
RBC,UR: <1 /HPF (ref 0–5)
Specific Gravity: 1.013 (ref 1.003–1.030)
WBC UR: 1 /HPF (ref 0–5)

## 2013-09-01 LAB — DRUG SCREEN, URINE
AMPHETAMINES, UR SCREEN: NEGATIVE (ref ?–1000)
Barbiturates, Ur Screen: NEGATIVE (ref ?–200)
Benzodiazepine, Ur Scrn: POSITIVE (ref ?–200)
COCAINE METABOLITE, UR ~~LOC~~: NEGATIVE (ref ?–300)
Cannabinoid 50 Ng, Ur ~~LOC~~: NEGATIVE (ref ?–50)
MDMA (Ecstasy)Ur Screen: NEGATIVE (ref ?–500)
Methadone, Ur Screen: NEGATIVE (ref ?–300)
OPIATE, UR SCREEN: POSITIVE (ref ?–300)
Phencyclidine (PCP) Ur S: NEGATIVE (ref ?–25)
TRICYCLIC, UR SCREEN: NEGATIVE (ref ?–1000)

## 2013-09-01 LAB — ACETAMINOPHEN LEVEL: Acetaminophen: <2 ug/mL

## 2013-09-01 LAB — SALICYLATE LEVEL: Salicylates, Serum: <1.7 mg/dL

## 2013-09-02 ENCOUNTER — Inpatient Hospital Stay: Payer: Self-pay | Admitting: Psychiatry

## 2013-09-15 ENCOUNTER — Emergency Department: Payer: Self-pay | Admitting: Emergency Medicine

## 2013-09-15 LAB — CBC
HCT: 35 % (ref 35.0–47.0)
HGB: 11.6 g/dL — ABNORMAL LOW (ref 12.0–16.0)
MCH: 28.4 pg (ref 26.0–34.0)
MCHC: 33.1 g/dL (ref 32.0–36.0)
MCV: 86 fL (ref 80–100)
PLATELETS: 334 10*3/uL (ref 150–440)
RBC: 4.09 10*6/uL (ref 3.80–5.20)
RDW: 14.7 % — ABNORMAL HIGH (ref 11.5–14.5)
WBC: 11.8 10*3/uL — ABNORMAL HIGH (ref 3.6–11.0)

## 2013-09-15 LAB — BASIC METABOLIC PANEL
ANION GAP: 9 (ref 7–16)
BUN: 7 mg/dL (ref 7–18)
CREATININE: 0.6 mg/dL (ref 0.60–1.30)
Calcium, Total: 8.8 mg/dL (ref 8.5–10.1)
Chloride: 106 mmol/L (ref 98–107)
Co2: 23 mmol/L (ref 21–32)
EGFR (Non-African Amer.): >60
Glucose: 133 mg/dL — ABNORMAL HIGH (ref 65–99)
OSMOLALITY: 276 (ref 275–301)
Potassium: 3.6 mmol/L (ref 3.5–5.1)
Sodium: 138 mmol/L (ref 136–145)

## 2013-09-15 LAB — TROPONIN I: Troponin-I: <0.02 ng/mL

## 2013-09-15 LAB — D-DIMER(ARMC): D-DIMER: 499 ng/mL

## 2013-10-22 ENCOUNTER — Emergency Department: Payer: Self-pay | Admitting: Emergency Medicine

## 2013-11-05 LAB — COMPREHENSIVE METABOLIC PANEL
ANION GAP: 8 (ref 7–16)
AST: 60 U/L — AB (ref 15–37)
Albumin: 3.6 g/dL (ref 3.4–5.0)
Alkaline Phosphatase: 100 U/L
BUN: 8 mg/dL (ref 7–18)
Bilirubin,Total: 0.2 mg/dL (ref 0.2–1.0)
CHLORIDE: 106 mmol/L (ref 98–107)
CO2: 23 mmol/L (ref 21–32)
Calcium, Total: 8.7 mg/dL (ref 8.5–10.1)
Creatinine: 0.9 mg/dL (ref 0.60–1.30)
EGFR (African American): >60
Glucose: 192 mg/dL — ABNORMAL HIGH (ref 65–99)
Osmolality: 277 (ref 275–301)
POTASSIUM: 3.5 mmol/L (ref 3.5–5.1)
SGPT (ALT): 53 U/L (ref 12–78)
SODIUM: 137 mmol/L (ref 136–145)
Total Protein: 7.9 g/dL (ref 6.4–8.2)

## 2013-11-05 LAB — URINALYSIS, COMPLETE
Bilirubin,UR: NEGATIVE
Blood: NEGATIVE
Glucose,UR: 50 mg/dL (ref 0–75)
KETONE: NEGATIVE
Leukocyte Esterase: NEGATIVE
Nitrite: NEGATIVE
Ph: 6 (ref 4.5–8.0)
Protein: NEGATIVE
RBC,UR: 1 /HPF (ref 0–5)
SPECIFIC GRAVITY: 1.014 (ref 1.003–1.030)
Squamous Epithelial: 1
WBC UR: 2 /HPF (ref 0–5)

## 2013-11-05 LAB — CBC
HCT: 36.5 % (ref 35.0–47.0)
HGB: 11.8 g/dL — AB (ref 12.0–16.0)
MCH: 27.4 pg (ref 26.0–34.0)
MCHC: 32.4 g/dL (ref 32.0–36.0)
MCV: 85 fL (ref 80–100)
Platelet: 298 10*3/uL (ref 150–440)
RBC: 4.32 10*6/uL (ref 3.80–5.20)
RDW: 14.8 % — ABNORMAL HIGH (ref 11.5–14.5)
WBC: 14.1 10*3/uL — ABNORMAL HIGH (ref 3.6–11.0)

## 2013-11-05 LAB — DRUG SCREEN, URINE
AMPHETAMINES, UR SCREEN: NEGATIVE (ref ?–1000)
BENZODIAZEPINE, UR SCRN: POSITIVE (ref ?–200)
Barbiturates, Ur Screen: NEGATIVE (ref ?–200)
COCAINE METABOLITE, UR ~~LOC~~: NEGATIVE (ref ?–300)
Cannabinoid 50 Ng, Ur ~~LOC~~: NEGATIVE (ref ?–50)
MDMA (Ecstasy)Ur Screen: NEGATIVE (ref ?–500)
METHADONE, UR SCREEN: NEGATIVE (ref ?–300)
OPIATE, UR SCREEN: NEGATIVE (ref ?–300)
Phencyclidine (PCP) Ur S: NEGATIVE (ref ?–25)
Tricyclic, Ur Screen: NEGATIVE (ref ?–1000)

## 2013-11-05 LAB — ETHANOL: Ethanol: <3 mg/dL

## 2013-11-05 LAB — ACETAMINOPHEN LEVEL

## 2013-11-05 LAB — SALICYLATE LEVEL

## 2013-11-06 ENCOUNTER — Inpatient Hospital Stay: Payer: Self-pay | Admitting: Psychiatry

## 2013-11-09 LAB — TSH: THYROID STIMULATING HORM: 1.01 u[IU]/mL

## 2013-11-09 LAB — LIPID PANEL
CHOLESTEROL: 155 mg/dL (ref 0–200)
HDL: 27 mg/dL — AB (ref 40–60)
LDL CHOLESTEROL, CALC: 76 mg/dL (ref 0–100)
Triglycerides: 258 mg/dL — ABNORMAL HIGH (ref 0–200)
VLDL CHOLESTEROL, CALC: 52 mg/dL — AB (ref 5–40)

## 2013-11-09 LAB — HEMOGLOBIN A1C: Hemoglobin A1C: 6.9 % — ABNORMAL HIGH (ref 4.2–6.3)

## 2013-11-09 LAB — SGOT (AST)(ARMC): SGOT(AST): 44 U/L — ABNORMAL HIGH (ref 15–37)

## 2013-11-15 ENCOUNTER — Ambulatory Visit: Payer: Self-pay | Admitting: Psychiatry

## 2013-11-15 LAB — ELECTROLYTE PANEL
ANION GAP: 5 — AB (ref 7–16)
CHLORIDE: 109 mmol/L — AB (ref 98–107)
CO2: 25 mmol/L (ref 21–32)
Potassium: 3.8 mmol/L (ref 3.5–5.1)
Sodium: 139 mmol/L (ref 136–145)

## 2013-11-15 LAB — CREATININE, SERUM
CREATININE: 0.88 mg/dL (ref 0.60–1.30)
EGFR (Non-African Amer.): >60

## 2013-11-15 LAB — LITHIUM LEVEL: Lithium: 0.69 mmol/L

## 2013-11-15 LAB — BUN: BUN: 13 mg/dL (ref 7–18)

## 2013-11-18 ENCOUNTER — Ambulatory Visit: Payer: Self-pay | Admitting: Physician Assistant

## 2013-11-25 ENCOUNTER — Emergency Department: Payer: Self-pay | Admitting: Emergency Medicine

## 2013-11-25 LAB — COMPREHENSIVE METABOLIC PANEL
ANION GAP: 10 (ref 7–16)
Albumin: 3.6 g/dL (ref 3.4–5.0)
Alkaline Phosphatase: 78 U/L
BILIRUBIN TOTAL: 0.2 mg/dL (ref 0.2–1.0)
BUN: 8 mg/dL (ref 7–18)
CHLORIDE: 107 mmol/L (ref 98–107)
CREATININE: 0.91 mg/dL (ref 0.60–1.30)
Calcium, Total: 9 mg/dL (ref 8.5–10.1)
Co2: 23 mmol/L (ref 21–32)
EGFR (African American): >60
EGFR (Non-African Amer.): >60
Glucose: 97 mg/dL (ref 65–99)
Osmolality: 278 (ref 275–301)
POTASSIUM: 3.7 mmol/L (ref 3.5–5.1)
SGOT(AST): 33 U/L (ref 15–37)
SGPT (ALT): 49 U/L
SODIUM: 140 mmol/L (ref 136–145)
TOTAL PROTEIN: 7.7 g/dL (ref 6.4–8.2)

## 2013-11-25 LAB — URINALYSIS, COMPLETE
BLOOD: NEGATIVE
Bilirubin,UR: NEGATIVE
GLUCOSE, UR: NEGATIVE mg/dL (ref 0–75)
Ketone: NEGATIVE
LEUKOCYTE ESTERASE: NEGATIVE
NITRITE: NEGATIVE
Ph: 5 (ref 4.5–8.0)
Protein: NEGATIVE
SPECIFIC GRAVITY: 1.013 (ref 1.003–1.030)
Squamous Epithelial: 2
WBC UR: 5 /HPF (ref 0–5)

## 2013-11-25 LAB — CBC
HCT: 36.5 % (ref 35.0–47.0)
HGB: 12.1 g/dL (ref 12.0–16.0)
MCH: 27.9 pg (ref 26.0–34.0)
MCHC: 33.3 g/dL (ref 32.0–36.0)
MCV: 84 fL (ref 80–100)
Platelet: 322 10*3/uL (ref 150–440)
RBC: 4.34 10*6/uL (ref 3.80–5.20)
RDW: 14.5 % (ref 11.5–14.5)
WBC: 12 10*3/uL — ABNORMAL HIGH (ref 3.6–11.0)

## 2013-11-25 LAB — ETHANOL: Ethanol %: <0.003 % (ref 0.000–0.080)

## 2013-11-25 LAB — SALICYLATE LEVEL: Salicylates, Serum: <1.7 mg/dL

## 2013-11-25 LAB — DRUG SCREEN, URINE

## 2013-11-25 LAB — ACETAMINOPHEN LEVEL

## 2013-11-30 ENCOUNTER — Ambulatory Visit: Payer: Self-pay | Admitting: Physician Assistant

## 2013-12-10 ENCOUNTER — Emergency Department: Payer: Self-pay | Admitting: Internal Medicine

## 2014-06-10 ENCOUNTER — Ambulatory Visit: Payer: Self-pay | Admitting: Psychiatry

## 2014-06-20 DIAGNOSIS — E119 Type 2 diabetes mellitus without complications: Secondary | ICD-10-CM | POA: Insufficient documentation

## 2014-06-20 DIAGNOSIS — E782 Mixed hyperlipidemia: Secondary | ICD-10-CM | POA: Insufficient documentation

## 2014-06-30 DIAGNOSIS — G4733 Obstructive sleep apnea (adult) (pediatric): Secondary | ICD-10-CM | POA: Insufficient documentation
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
## 2014-08-13 NOTE — Discharge Summary (Signed)
PATIENT NAME:  Susan Tanner, Susan Tanner MR#:  [id number] DATE OF BIRTH:  1980-09-17  DATE OF ADMISSION:  09/02/2013 DATE OF DISCHARGE:  09/07/2013   HOSPITAL COURSE: See dictated history and physical for details of admission. This 34 year old woman with a history of mood instability came to the hospital reporting that she was having intrusive thoughts about violence. No suicidal ideation, but mood was more unstable. In the hospital, she was initially on precautions but did not show any dangerous behavior in the hospital. She engaged appropriately in groups and individual therapy. There were some adjustments made to her medicine which she tolerated fine. Increased dose of sertraline and increased olanzapine. The patient had a little bit of fatigue from her medicine but otherwise tolerated it well. At the time of discharge, she reported that her hallucinations or intrusive thoughts were all gone. She was still feeling anxious but was agreeable to outpatient treatment and preferred to go home. She Susan Tanner follow up in the community with Dr. Annitta Jersey.   DISCHARGE MENTAL STATUS EXAMINATION:  Casually dressed woman, looks her stated age, cooperative with the interview. Good eye contact, normal psychomotor activity. Speech normal rate, tone and volume. Affect slightly blunted. Mood stated as being okay. Thoughts are lucid. No loosening of associations. No delusions. Denies auditory or visual hallucinations. Denies suicidal or homicidal ideation. Shows improved judgment and insight. Normal intelligence. Alert and oriented x 4, short and long-term memory intact.   DISCHARGE MEDICATIONS: Tramadol 50 mg every 4 hours as needed for pain, Topamax 100 mg twice a day, Xanax 1 mg 3 times a day as needed for anxiety, Ambien 10 mg at night for sleep, pantoprazole 40 mg a day, sertraline 200 mg a day and olanzapine 20 mg per day.   LABORATORY RESULTS: Admission labs included negative salicylates and acetaminophen. Alcohol level negative.  Chemistry panel: Elevated glucose 157 on a nonfasting draw. CBC:  Slightly elevated white count 11.7. Urinalysis unremarkable. Drug screen positive for opiates and benzodiazepines.   DISPOSITION: Discharged home with her family. Follow up with Dr. Annitta Jersey.   DIAGNOSIS, PRINCIPAL AND PRIMARY:  AXIS I: Major depression, recurrent, severe.   SECONDARY DIAGNOSES: AXIS I:   Posttraumatic stress disorder, obsessive-compulsive disorder.  AXIS II:  Borderline features.  AXIS III: Overweight, migraines.  AXIS IV: Moderate.  AXIS V:  Functioning at time of discharge 60.   ____________________________ Gonzella Lex, MD jtc:ce D: 09/07/2013 18:20:15 ET T: 09/07/2013 18:43:12 ET JOB#: [id number]  cc: Gonzella Lex, MD, <Dictator> Gonzella Lex MD ELECTRONICALLY SIGNED 09/23/2013 11:34

## 2014-08-13 NOTE — Discharge Summary (Signed)
PATIENT NAME:  Susan Tanner, Susan Tanner MR#:  [id number] DATE OF BIRTH:  24-Feb-1981  DATE OF ADMISSION:  11/06/2013 DATE OF DISCHARGE:    REASON FOR ADMISSION:  The patient was admitted on November 05, 2013, after she attempted to cut her arm with a knife.  DISCHARGE DIAGNOSES: AXIS I:   1.  Major depressive disorder. 2.  Panic disorder with agoraphobia.  3.  History of bipolar disorder. AXIS II:  Borderline personality disorder. AXIS III:   1.  Obesity.   2.  Vitamin D deficiency. 3.  Hyperlipidemia. 4.  Diabetes type II. AXIS IV:  Facing future changes in income if her disability is not approved. AXIS V:  Global assessment of function of 55.   DISCHARGE MEDICATIONS: 1.  Lithium CR 450 mg p.o. b.i.d. 2.  Sertraline 200 mg p.o. daily for depression. 3.  Zyprexa 10 mg p.o. at bedtime for depression and impulsivity. 4.  Clonazepam 1 mg p.o. t.i.d. for anxiety. 5.  Pantoprazole 40 mg p.o. daily for GERD. 6.  Metformin 500 mg p.o. b.i.d. for metabolic syndrome and diabetes type 2. 7.  Topamax 50 mg p.o. b.i.d. for weight loss.   MEDICATIONS THAT WHERE DISCONTINUED DURING THIS ADMISSION: Alprazolam.  HOSPITAL COURSE:  The patient was admitted on July 17 due to suicidal attempt, when the patient attempted to cut her arm with a knife.  The patient reports that her major stressor right now seems to be the fact that her long-term disability, provided by her last job, will be running out in November of this year.  The patient fears that her income will change, as her SSI disability has been denied a couple of times.  The patient states that she has a long history of mental illness that started in her early 46s.  She also reported having a strong family history of mental illness.  Patient described symptoms that met the criteria for agoraphobia and panic disorder.  The patient also mentioned symptoms consistent with a major depressive disorder, such as decreased mood, appetite, energy, concentration, and  suicidal ideation.  Per records, looks like the patient has been diagnosed with bipolar disorder.  It is not clear whether the patient, or not, has had a manic episode and was unable to elicits these symptoms during our assessment.  The patient does report history of hallucinations that appear to be on and off.  Patient also reported frequent thoughts of suicide and urges for cutting. Per collateral information by Dr. Annitta Jersey, the patient has been under the care of multiple psychiatrists.  Many of them have attempted to help the patient and adjust her medication regimen; however, the patient has never reported significant improvement with any other medication she has tried.  She frequently requests to have this medication changed and adjusted.  Patient also has a long list of adverse reactions to psychiatric agents.  Patient complains that Seroquel, Zyprexa, and Depakote have caused severe weight gain.  Abilify caused suicidality.  Geodon caused liver impairment.  Dr. Annitta Jersey also reported the patient had been on Latuda with poor response.  Dr. Annitta Jersey also described that the patient has reported multiple symptoms of different psychiatric disorders.    Based on the presentation, the patient does appear to be meet the criteria for borderline personality disorder.  As prior of the routine workout at behavioral health, the patient had a hemoglobin A1c, a TSH, and a lipid panel.  The lipid panel showed triglyceride level of 258, a decreased HDL of 27, total cholesterol  level of 155.  She has a hemoglobin A1c of 6.9, that meet the criteria for diabetes.  Her AST was elevated at admission at 20.  Rechecked them on July 21, and it had decreased to 44, but is still elevated.  Her TSH was found to be normal.    As a result of these findings, the patient was started on metformin 500 mg p.o. b.i.d. to address metabolic syndrome and diabetes.  Her diet was changed to diabetic diet to help decrease triglyceride levels.  The patient  was educated about this new finding and she was advised to follow up closely with her primary care provider.    As a result of the obesity and metabolic symptoms, it was in the patient's best interest to taper off the olanzapine.  For this reason, the medication was decreased from 20 mg to 10 mg p.o. at bedtime.  As the patient reported, symptoms consistent with borderline personality disorder, major depressive disorder, and frequent suicidality, she was started on lithium carbonate 300 mg p.o. b.i.d. and the dose was gradually increased to 450 mg p.o. b.i.d. as she tolerated it well.    For her anxiety, the patient was initially started on Ativan; however, the patient continued to have issues with anxiety.  For this reason, this medication was discontinued and the patient was started on a longer acting benzodiazepine, clonazepam 2 mg 3 times a day.  This medication was later on tapered to 1 mg p.o. t.i.d. as the patient reported excessive sedation with the 2 mg dose.    At the time of discharge, the patient reported improvement in mood and decrease in the levels of anxiety.  She was not experiencing thoughts of suicide or thoughts of self harm at the time of the discharge.  The patient felt comfortable with the discharge plan, did not have any concerns.    LABORATORY RESULTS:  WBC 14.1, hemoglobin 11.8, hematocrit 36.5, platelets 298,000.  UA was clear.  Acetaminophen level was below detection.  Salicylate level was below detection.  Urine toxicology screen was positive for benzodiazepines.  TSH was 1.01.  AST, at admission, was 60, at discharge on the 21st was 44.  ALT 53.  Alkaline phosphatase 100.  Total bilirubin 0.2.  Albumin 3.6.  Total protein 7.9.  Alcohol was below detection limit.  Hemoglobin A1c was 6.9.  HDL 27, triglycerides 258.  Cholesterol total 155.  Calcium is 8.7.  VLDL cholesterol 52.  GFR greater than 60.  BUN 8, creatinine 0.9.  Urine pregnancy was negative.  Patient reported  hysterectomy as well.    DISCHARGE FOLLOWUP:  The patient will continue to follow up with [facility] where she has an individual therapist and will continue psychiatric are with Dr. Annitta Jersey, her outpatient psychiatrist.  She has an upcoming appointment this Monday, July 27 at 10:00 a.m.  Order for a lithium level will be given to the patient to have the lithium drawn outpatient on Monday July 27 at 6 a.m. prior to taking her morning medications (day 5 of lithium).  RECOMMENDATIONS:  Consider continuing to taper off olanzapine as the patient has developed metabolic syndrome.   Consider using antipsychotics that could be neutral with her weight, such as the typical antipsychotics.  These medications can be used at a low dose.  The patient has tried multiple atypical antipsychotics with only severe adverse reaction.   ____________________________ Hildred Priest, MD ahg:ts D: 11/11/2013 13:51:18 ET T: 11/11/2013 14:21:46 ET JOB#: [id number]  cc: Seth Bake  Doran Clay, MD, <Dictator> Rhodia Albright MD ELECTRONICALLY SIGNED 11/12/2013 13:01
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
## 2014-08-13 NOTE — Consult Note (Signed)
PATIENT NAME:  Susan Tanner, HARDIMAN MR#:  [id number] DATE OF BIRTH:  [birth year]  DATE OF CONSULTATION:  11/26/2013  REFERRING PHYSICIAN:   Emergency Room CONSULTING PHYSICIAN:  Gonzella Lex, MD  IDENTIFYING INFORMATION AND REASON FOR CONSULT: A 34 year old woman with chronic depression who was brought to the Emergency Room after taking an excessive number of Xanax.   CHIEF COMPLAINT: "I wasn't trying to hurt myself." Consult for evaluation of patient under IVC.   HISTORY OF PRESENT ILLNESS: The patient tells me that she was not trying to hurt herself. Admits that she took about 5 Xanax tablets. She says that she was arguing with her mother and feeling stressed out and anxious and just wanted to calm down. She denies that she had taken anything else with it. She said that she was arguing with her mother over a disagreement about whether the patient and her family would move to [place]. Prior to that, the patient says that her mood has been pretty good in the last week or so. Feels like her depression has been under reasonable control. Sleeping adequately. Appetite about the same. No active suicidal ideation. No hallucinations. Says that she has been compliant with all of her medication and not abusing drugs or alcohol. The Xanax she took were left over from when she was previously prescribed them and are not current medicines she is taking.   PAST PSYCHIATRIC HISTORY: Long history of chronic depression. Has had hospitalizations before and was in our hospital last month. She was discharged on the medicine she is currently taking now and she follows up with Dr. Annitta Jersey and Otila Kluver, the therapist in our office. Says that she has been compliant with that. She does have a past history of overdoses.   SOCIAL HISTORY: Married, has 3 children, but still lives with her mother. Sounds like there is a chronically enmeshed relationship. She has told me that she thinks that her marriage is going okay. Does not have a lot of  other social complaints.   PAST MEDICAL HISTORY: Overweight, otherwise, no significant major medical problems ongoing.   FAMILY HISTORY: Positive for mood symptoms.   SUBSTANCE ABUSE HISTORY: Denies alcohol or drug abuse, current or in the past.   CURRENT MEDICATIONS: Topamax 50 mg twice a day, olanzapine 10 mg at night, lithium carbonate 450 mg twice a day, clonazepam 1 mg 3 times a day, sertraline 100 mg day.   ALLERGIES: PERCOCET, CLARITIN, AND LAMICTAL.   REVIEW OF SYSTEMS: Currently denies depression. Denies suicidal or homicidal ideation. Denies hallucinations. No other acute physical complaints. The rest of the review of systems negative.   MENTAL STATUS EXAMINATION: Neatly groomed woman, looks her stated age, cooperative with the interview. Good eye contact, normal psychomotor activity. Speech normal rate, tone and volume. Affect euthymic, reactive, appropriate. Mood stated as being okay. Thoughts are lucid without loosening of associations or delusions. Denies auditory or visual hallucinations. Denies suicidal or homicidal ideation. Judgment and insight good. Memory intact, 3 out of 3 objects immediately and at 3 minutes, long-term memory intact. Normal intelligence.   VITAL SIGNS: In the Emergency Room show a blood pressure of 118/58, respirations 18, pulse 91. The patient is overweight but does not appear to be in any acute physical distress.   LABORATORY RESULTS: Blood sugars have been normal. The chemistry panel was completely normal. The alcohol level was negative. Drug screen positive only for benzodiazepines, as would be expected. Urinalysis: 2+ bacteria but minimal white cells, questionable whether that is contaminated.  It does not look like a normal infection to me. Acetaminophen and salicylates negative.   ASSESSMENT: This is a 34 year old woman with chronic depression who took excessive Xanax yesterday after an argument with her mother. She has consistently denied suicidal  intent since being evaluated here. She is behaving calm and not in a dangerous manner. Does not appear to be overwrought or in an extreme mental condition. She has appropriate outpatient treatment in the community and positive plans for the future. At this point, she does not appear to be acutely dangerous to herself or others. She has been counseled about the importance of not misusing medication and counseled to get rid of any medicines that she is no longer being prescribed. She agrees that that will be done. She will follow up with her outpatient psychiatrist and is encouraged to call and get an appointment to see Miguel Dibble for therapy at an earlier date than currently scheduled. She says she will try to do that. I will take her off IVC and she can be discharged from the Emergency Room.   DIAGNOSIS, PRINCIPAL AND PRIMARY:  AXIS I: Major depression, severe, recurrent.   SECONDARY DIAGNOSES: AXIS I: Deferred.  AXIS II: Borderline features.  AXIS III: Overweight.  AXIS IV: Moderate chronic social stress.  AXIS V: Functioning at time of evaluation is a 77.   ____________________________ Gonzella Lex, MD jtc:dw D: 11/26/2013 12:22:18 ET T: 11/26/2013 13:00:06 ET JOB#: [id number]  cc: Gonzella Lex, MD, <Dictator> Gonzella Lex MD ELECTRONICALLY SIGNED 12/16/2013 22:43
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
## 2014-09-22 ENCOUNTER — Telehealth: Payer: Self-pay

## 2014-09-22 ENCOUNTER — Ambulatory Visit: Payer: Self-pay | Admitting: Psychiatry

## 2014-09-27 NOTE — Telephone Encounter (Signed)
Called twice and left messages with [facility] [phone] for caseworker / social worker to please call our office. Per dr. Gwyndolyn Saxon, he feels pt does not need to be discharged to his care but needs a act team, need to talk with social worker in regards.

## 2014-09-27 NOTE — Telephone Encounter (Signed)
States that pt is in the [facility]

## 2014-09-28 NOTE — Telephone Encounter (Signed)
Caseworker was told that I had called on the 09-22-14 @ 2:00 and pt had not been processed yet and that I had left a message then for a casework to call because Dr. Jimmye Norman wants patient to follow up with ACT Team.  I also let caseworker know that I had call again and spoke with a mike in the admission office and explained the same thing and still no one called [place] back.    I did ask Dr. Jimmye Norman again if he would follow up with patient and that they would also work on find a ACT Team for patient, per Dr. Jimmye Norman NO.  We have given them enough time to find a ACT Team if they would have returned our calls when patient was first admitted.  Caseworker was told what Dr. Jimmye Norman said and she was mad and said that we put her in a bad position and hung up.

## 2014-09-30 ENCOUNTER — Other Ambulatory Visit: Payer: Self-pay

## 2014-09-30 NOTE — Telephone Encounter (Signed)
Tc to [facility] and they do give patient rx when they leave the hospital. Pt was called to check her information that the hospital gave her when she was discharged from the hospital.  Pt was advised that if she did not get rx to contact the [facility]

## 2014-09-30 NOTE — Telephone Encounter (Signed)
pt states she needs refills on all her medications.  pt states she has appt with [facility] in [place] on 10-12-2014

## 2014-10-27 ENCOUNTER — Telehealth: Payer: Self-pay

## 2014-10-31 NOTE — Telephone Encounter (Signed)
Opened in error

## 2015-10-11 ENCOUNTER — Emergency Department: Payer: Medicare HMO

## 2015-10-11 ENCOUNTER — Emergency Department
Admission: EM | Admit: 2015-10-11 | Discharge: 2015-10-12 | Disposition: A | Discharge status: Home / Self Care | Payer: Medicare HMO | Attending: Emergency Medicine | Admitting: Emergency Medicine

## 2015-10-11 DIAGNOSIS — Z79899 Other long term (current) drug therapy: Secondary | ICD-10-CM | POA: Insufficient documentation

## 2015-10-11 DIAGNOSIS — E119 Type 2 diabetes mellitus without complications: Secondary | ICD-10-CM | POA: Insufficient documentation

## 2015-10-11 DIAGNOSIS — F329 Major depressive disorder, single episode, unspecified: Secondary | ICD-10-CM | POA: Insufficient documentation

## 2015-10-11 DIAGNOSIS — R1030 Lower abdominal pain, unspecified: Secondary | ICD-10-CM | POA: Insufficient documentation

## 2015-10-11 DIAGNOSIS — J45909 Unspecified asthma, uncomplicated: Secondary | ICD-10-CM | POA: Insufficient documentation

## 2015-10-11 DIAGNOSIS — F319 Bipolar disorder, unspecified: Secondary | ICD-10-CM | POA: Insufficient documentation

## 2015-10-11 HISTORY — DX: Type 2 diabetes mellitus without complications: E11.9

## 2015-10-11 LAB — COMPREHENSIVE METABOLIC PANEL
ALBUMIN: 4.4 g/dL (ref 3.5–5.0)
ALK PHOS: 81 U/L (ref 38–126)
ALT: 36 U/L (ref 14–54)
ANION GAP: 8 (ref 5–15)
AST: 27 U/L (ref 15–41)
BUN: 11 mg/dL (ref 6–20)
CHLORIDE: 109 mmol/L (ref 101–111)
CO2: 23 mmol/L (ref 22–32)
Calcium: 9.4 mg/dL (ref 8.9–10.3)
Creatinine, Ser: 0.74 mg/dL (ref 0.44–1.00)
GFR calc non Af Amer: >60 mL/min (ref >60)
GLUCOSE: 129 mg/dL — AB (ref 65–99)
POTASSIUM: 3.7 mmol/L (ref 3.5–5.1)
SODIUM: 140 mmol/L (ref 135–145)
Total Bilirubin: 0.6 mg/dL (ref 0.3–1.2)
Total Protein: 7.8 g/dL (ref 6.5–8.1)

## 2015-10-11 LAB — URINALYSIS COMPLETE WITH MICROSCOPIC (ARMC ONLY)
BACTERIA UA: NONE SEEN
Bilirubin Urine: NEGATIVE
GLUCOSE, UA: NEGATIVE mg/dL
Hgb urine dipstick: NEGATIVE
Ketones, ur: NEGATIVE mg/dL
Leukocytes, UA: NEGATIVE
Nitrite: NEGATIVE
PROTEIN: NEGATIVE mg/dL
SPECIFIC GRAVITY, URINE: 1.018 (ref 1.005–1.030)
pH: 6 (ref 5.0–8.0)

## 2015-10-11 LAB — CBC
HEMATOCRIT: 39.9 % (ref 35.0–47.0)
HEMOGLOBIN: 13.5 g/dL (ref 12.0–16.0)
MCH: 28.1 pg (ref 26.0–34.0)
MCHC: 33.8 g/dL (ref 32.0–36.0)
MCV: 83 fL (ref 80.0–100.0)
Platelets: 359 10*3/uL (ref 150–440)
RBC: 4.81 MIL/uL (ref 3.80–5.20)
RDW: 14 % (ref 11.5–14.5)
WBC: 14.8 10*3/uL — ABNORMAL HIGH (ref 3.6–11.0)

## 2015-10-11 NOTE — ED Provider Notes (Signed)
[facility] Emergency Department Provider Note   ____________________________________________  Time seen: I have reviewed the triage vital signs and the triage nursing note.  HISTORY  Chief Complaint Abdominal Pain   Historian Patient and mom  HPI Susan Tanner is a 35 y.o. female with a history of endometriosis and irritable bowel, is here for bilateral pelvic discomfort for about a week. Gradual onset, mild to moderate in intensity, currently 4 out of 10 pain. One loose bowel movement today which was nonbloody. She does not have a problem with constipation. She has been feeling bloated. Denies fever. Denies bloody stools. She has had a prior appendectomy. She has had a partial hysterectomy. She had partial gastrectomy due to endometriosis in 2011 and has not really had pains like this since then although it does remind her of endometriosis pain more than anything else. Nothing seems to make it worse or better. Not associated with food.   Past Medical History  Diagnosis Date  . Bipolar 1 disorder ([place])   . Depression   . Anxiety   . Endometriosis   . Asthma     There are no active problems to display for this patient.   Past Surgical History  Procedure Laterality Date  . Appendectomy    . Abdominal hysterectomy    . Tubal ligation      Current Outpatient Rx  Name  Route  Sig  Dispense  Refill  . clonazePAM (KLONOPIN) 1 MG tablet   Oral   Take 1 mg by mouth 3 (three) times daily.         Marland Kitchen escitalopram (LEXAPRO) 10 MG tablet   Oral   Take 20 mg by mouth daily.         Marland Kitchen HYDROcodone-acetaminophen (NORCO/VICODIN) 5-325 MG per tablet   Oral   Take 1 tablet by mouth every 6 (six) hours as needed for pain.         Marland Kitchen HYDROcodone-acetaminophen (NORCO/VICODIN) 5-325 MG per tablet   Oral   Take 2 tablets by mouth every 4 (four) hours as needed for pain.   20 tablet   0   . omeprazole (PRILOSEC) 20 MG capsule   Oral   Take 1 capsule (20  mg total) by mouth daily.   14 capsule   0   . ondansetron (ZOFRAN) 8 MG tablet   Oral   Take 1 tablet (8 mg total) by mouth every 8 (eight) hours as needed for nausea.   20 tablet   0   . promethazine (PHENERGAN) 25 MG tablet   Oral   Take 25 mg by mouth every 8 (eight) hours as needed for nausea.         . sucralfate (CARAFATE) 1 G tablet   Oral   Take 1 tablet (1 g total) by mouth 4 (four) times daily.   28 tablet   0   . topiramate (TOPAMAX) 25 MG tablet   Oral   Take 100 mg by mouth daily.         . ziprasidone (GEODON) 40 MG capsule   Oral   Take 40 mg by mouth 2 (two) times daily with a meal.           Allergies Claritin-d 12 hour; Geodon; Percocet; Lamictal; and Tape  No family history on file.  Social History Social History  Substance Use Topics  . Smoking status: Never Smoker   . Smokeless tobacco: None  . Alcohol Use: No  Review of Systems  Constitutional: Negative for fever. Eyes: Negative for visual changes. ENT: Negative for sore throat. Cardiovascular: Negative for chest pain. Respiratory: Negative for shortness of breath. Gastrointestinal: As per history of present illness. Genitourinary: Negative for dysuria. Musculoskeletal: Negative for back pain. Skin: Negative for rash. Neurological: Negative for headache. 10 point Review of Systems otherwise negative ____________________________________________   PHYSICAL EXAM:  VITAL SIGNS: ED Triage Vitals  Enc Vitals Group     BP 10/11/15 1939 116/60 mmHg     Pulse Rate 10/11/15 1939 94     Resp 10/11/15 1939 18     Temp 10/11/15 1939 98.6 F (37 C)     Temp Source 10/11/15 1939 Oral     SpO2 10/11/15 1939 97 %     Weight 10/11/15 1939 230 lb (104.327 kg)     Height 10/11/15 1939 5\' 4"  (1.626 m)     Head Cir --      Peak Flow --      Pain Score 10/11/15 1941 4     Pain Loc --      Pain Edu? --      Excl. in [place]? --      Constitutional: Alert and oriented. Well appearing  and in no distress. HEENT   Head: Normocephalic and atraumatic.      Eyes: Conjunctivae are normal. PERRL. Normal extraocular movements.      Ears:         Nose: No congestion/rhinnorhea.   Mouth/Throat: Mucous membranes are moist.   Neck: No stridor. Cardiovascular/Chest: Normal rate, regular rhythm.  No murmurs, rubs, or gallops. Respiratory: Normal respiratory effort without tachypnea nor retractions. Breath sounds are clear and equal bilaterally. No wheezes/rales/rhonchi. Gastrointestinal: Soft. No distention, no guarding, no rebound. Obese. Mild tenderness in the lower abdomen.  Genitourinary/rectal:Deferred Musculoskeletal: Nontender with normal range of motion in all extremities. No joint effusions.  No lower extremity tenderness.  No edema. Neurologic:  Normal speech and language. No gross or focal neurologic deficits are appreciated. Skin:  Skin is warm, dry and intact. No rash noted. Psychiatric: Mood and affect are normal. Speech and behavior are normal. Patient exhibits appropriate insight and judgment.  ____________________________________________   EKG I, Lisa Roca, MD, the attending physician have personally viewed and interpreted all ECGs.  None ____________________________________________  LABS (pertinent positives/negatives)  Labs Reviewed  COMPREHENSIVE METABOLIC PANEL - Abnormal; Notable for the following:    Glucose, Bld 129 (*)    All other components within normal limits  CBC - Abnormal; Notable for the following:    WBC 14.8 (*)    All other components within normal limits  URINALYSIS COMPLETEWITH MICROSCOPIC ([facility] ONLY) - Abnormal; Notable for the following:    Color, Urine YELLOW (*)    APPearance CLEAR (*)    Squamous Epithelial / LPF 0-5 (*)    All other components within normal limits    ____________________________________________  RADIOLOGY All Xrays were viewed by me. Imaging interpreted by Radiologist.  Transvaginal and  pelvic ultrasound: Pending __________________________________________  PROCEDURES  Procedure(s) performed: None  Critical Care performed: None  ____________________________________________   ED COURSE / ASSESSMENT AND PLAN  Pertinent labs & imaging results that were available during my care of the patient were reviewed by me and considered in my medical decision making (see chart for details).   Patient is here for evaluation of 1 week of lower abdominal pain. Her white blood cell count is a little bit elevated at 14.8, but in looking at  prior labs, her white blood cell count is often a little bit elevated in the 12-13 range. I'm unclear what to make of this specifically. She is denying urinary symptoms and her urinalysis is reassuring without evidence for urinary tract infection.  The location and description of her symptoms do sound like it may still be due to pelvic etiology such as ovarian cyst which she's had in the past, or even recurrent endometriosis. I discussed with her obtaining ultrasound, although I think that torsion is very unlikely given her description of the discomfort. We also discussed CT imaging to rule out other abdominal source of pain given that it's been going on now for a week. Not sure how much weight to give the mildly elevated white blood cell count. Her abdominal exam is without concerning signs of rebound or guarding or even really significant pain when I press. Diverticulosis would be a consideration, although a little less likely in her age group. I'm a little concerned about CT radiation risk given prior CTs. When I discussed this with the patient, we decided to obtain an ultrasound to evaluate the ovaries first and if ovarian cysts are found, likely discharge home and follow-up with OB/GYN. If no ovarian cysts, normal ultrasound, would likely proceed to abdominal CT given ongoing symptoms for a week now.   Patient care transferred to Dr. Dahlia Client at shift  change 11:30 PM. Ultrasound results are pending.     CONSULTATIONS:   None   Patient / Family / Caregiver informed of clinical course, medical decision-making process, and agree with plan.   I discussed return precautions, follow-up instructions, and discharged instructions with patient and/or family.   ___________________________________________   FINAL CLINICAL IMPRESSION(S) / ED DIAGNOSES   Final diagnoses:  Lower abdominal pain              Note: This dictation was prepared with Dragon dictation. Any transcriptional errors that result from this process are unintentional   Lisa Roca, MD 10/11/15 2332

## 2015-10-11 NOTE — ED Notes (Signed)
For about a week states she has been having "ovarian pain" and "feels bloated." Denies dysuria, urgency or frequency. Denies vaginal discharge or bleeding.

## 2015-10-11 NOTE — ED Notes (Signed)
Patient transported to Ultrasound 

## 2015-10-12 ENCOUNTER — Emergency Department: Payer: Medicare HMO

## 2015-10-12 ENCOUNTER — Encounter: Payer: Self-pay | Admitting: Radiology

## 2015-10-12 MED ORDER — DIATRIZOATE MEGLUMINE & SODIUM 66-10 % PO SOLN
15.0000 mL | Freq: Once | ORAL | Status: AC
  Administered 2015-10-12: 15 mL via ORAL

## 2015-10-12 MED ORDER — IOPAMIDOL (ISOVUE-300) INJECTION 61%
100.0000 mL | Freq: Once | INTRAVENOUS | Status: AC | PRN
  Administered 2015-10-12: 100 mL via INTRAVENOUS

## 2015-10-12 NOTE — Discharge Instructions (Signed)

## 2015-10-12 NOTE — ED Provider Notes (Signed)
-----------------------------------------   2:07 AM on 10/12/2015 -----------------------------------------   Blood pressure 118/79, pulse 78, temperature 98.6 F (37 C), temperature source Oral, resp. rate 18, height 5\' 4"  (1.626 m), weight 230 lb (104.327 kg), SpO2 97 %.  Assuming care from Dr. Reita Cliche.  In short, Susan Tanner is a 35 y.o. female with a chief complaint of Abdominal Pain .  Refer to the original H&P for additional details.  The current plan of care is to follow up the result of the [place] and to perform a CT if it is negative.  Ultrasound: Status post hysterectomy ovaries are unremarkable in appearance, no evidence for ovarian torsion.   CT abdomen and pelvis: Unremarkable contrast enhanced CT of the abdomen and pelvis.  Given that the ultrasound of the CT are unremarkable at this time I feel that we're able to discharge the patient to home. I do not have a good cause of her pain but I think she should follow back up with her OB/GYN. The patient has been relaxing and comfortable in the emergency department. She'll be discharged home.  Loney Hering, MD 10/12/15 [phone]

## 2015-10-12 NOTE — ED Notes (Signed)
Pt finished with contrast, CT called.

## 2015-11-06 ENCOUNTER — Encounter: Payer: Self-pay | Admitting: Pediatrics

## 2016-01-29 DIAGNOSIS — I420 Dilated cardiomyopathy: Secondary | ICD-10-CM | POA: Insufficient documentation

## 2016-02-17 ENCOUNTER — Ambulatory Visit (INDEPENDENT_AMBULATORY_CARE_PROVIDER_SITE_OTHER): Payer: Medicare HMO

## 2016-02-17 ENCOUNTER — Ambulatory Visit
Admission: EM | Admit: 2016-02-17 | Discharge: 2016-02-17 | Disposition: A | Discharge status: Home / Self Care | Payer: Medicare HMO | Attending: Family Medicine | Admitting: Family Medicine

## 2016-02-17 DIAGNOSIS — J219 Acute bronchiolitis, unspecified: Secondary | ICD-10-CM

## 2016-02-17 DIAGNOSIS — J019 Acute sinusitis, unspecified: Secondary | ICD-10-CM

## 2016-02-17 MED ORDER — ALBUTEROL SULFATE HFA 108 (90 BASE) MCG/ACT IN AERS: 2.0000 | INHALATION_SPRAY | Freq: Four times a day (QID) | RESPIRATORY_TRACT | 0 refills | Status: AC | PRN

## 2016-02-17 MED ORDER — CEFUROXIME AXETIL 500 MG PO TABS: 500.0000 mg | ORAL_TABLET | Freq: Two times a day (BID) | ORAL | 0 refills | Status: DC

## 2016-02-17 MED ORDER — BENZONATATE 100 MG PO CAPS: 100.0000 mg | ORAL_CAPSULE | Freq: Three times a day (TID) | ORAL | 0 refills | Status: DC

## 2016-02-17 MED ORDER — IPRATROPIUM-ALBUTEROL 0.5-2.5 (3) MG/3ML IN SOLN
3.0000 mL | Freq: Once | RESPIRATORY_TRACT | Status: AC
  Administered 2016-02-17: 3 mL via RESPIRATORY_TRACT

## 2016-02-17 MED ORDER — PREDNISONE 10 MG (21) PO TBPK: ORAL_TABLET | ORAL | 0 refills | Status: DC

## 2016-02-17 NOTE — ED Provider Notes (Signed)
[facility]    CSN: [id number] Arrival date & time: 02/17/16  1233     History   Chief Complaint Chief Complaint  Patient presents with  . Nasal Congestion    runny nose sore throat coughing SOB chest congestion onset 10 days OTC mucinex were taken feels nauseated    HPI Susan Tanner is a 35 y.o. female.   HPI  Past Medical History:  Diagnosis Date  . Anxiety   . Asthma   . Bipolar 1 disorder ([place])   . Depression   . Diabetes mellitus without complication ([place])   . Endometriosis     There are no active problems to display for this patient.   Past Surgical History:  Procedure Laterality Date  . ABDOMINAL HYSTERECTOMY    . APPENDECTOMY    . TUBAL LIGATION      OB History    No data available       Home Medications    Prior to Admission medications   Medication Sig Start Date End Date Taking? Authorizing Provider  ALPRAZolam Duanne Moron) 1 MG tablet Take 1 mg by mouth at bedtime as needed for anxiety.   Yes Historical Provider, MD  carvedilol (COREG) 3.125 MG tablet Take 3.125 mg by mouth 2 (two) times daily with a meal.   Yes Historical Provider, MD  hydrOXYzine (ATARAX/VISTARIL) 50 MG tablet Take 50 mg by mouth 2 (two) times daily.   Yes Historical Provider, MD  lithium carbonate (ESKALITH) 450 MG CR tablet Take by mouth 2 (two) times daily. pt takes 1 in the morning and 2 at night   Yes Historical Provider, MD  omega-3 acid ethyl esters (LOVAZA) 1 g capsule Take by mouth 2 (two) times daily.   Yes Historical Provider, MD  QUEtiapine (SEROQUEL) 400 MG tablet Take 400 mg by mouth at bedtime.   Yes Historical Provider, MD  topiramate (TOPAMAX) 25 MG tablet Take 100 mg by mouth daily.   Yes Historical Provider, MD  albuterol (PROVENTIL HFA;VENTOLIN HFA) 108 (90 Base) MCG/ACT inhaler Inhale 2 puffs into the lungs every 6 (six) hours as needed for wheezing or shortness of breath. Patient can only tolerate ventolin inhaler 02/17/16   Frederich Cha, MD    benzonatate (TESSALON) 100 MG capsule Take 1 capsule (100 mg total) by mouth every 8 (eight) hours. 02/17/16   Frederich Cha, MD  cefUROXime (CEFTIN) 500 MG tablet Take 1 tablet (500 mg total) by mouth 2 (two) times daily. 02/17/16   Frederich Cha, MD  clonazePAM (KLONOPIN) 1 MG tablet Take 1 mg by mouth 3 (three) times daily.    Historical Provider, MD  escitalopram (LEXAPRO) 10 MG tablet Take 20 mg by mouth daily.    Historical Provider, MD  HYDROcodone-acetaminophen (NORCO/VICODIN) 5-325 MG per tablet Take 1 tablet by mouth every 6 (six) hours as needed for pain.    Historical Provider, MD  HYDROcodone-acetaminophen (NORCO/VICODIN) 5-325 MG per tablet Take 2 tablets by mouth every 4 (four) hours as needed for pain. 08/14/12   Gertha Calkin, PA-C  omeprazole (PRILOSEC) 20 MG capsule Take 1 capsule (20 mg total) by mouth daily. 08/14/12   Catherine Schinlever, PA-C  ondansetron (ZOFRAN) 8 MG tablet Take 1 tablet (8 mg total) by mouth every 8 (eight) hours as needed for nausea. 08/14/12   Gertha Calkin, PA-C  predniSONE (STERAPRED UNI-PAK 21 TAB) 10 MG (21) TBPK tablet Sig 6 tablet day 1, 5 tablets day 2, 4 tablets day 3,,3tablets day 4, 2 tablets day  5, 1 tablet day 6 take all tablets orally 02/17/16   Frederich Cha, MD  promethazine (PHENERGAN) 25 MG tablet Take 25 mg by mouth every 8 (eight) hours as needed for nausea.    Historical Provider, MD  sucralfate (CARAFATE) 1 G tablet Take 1 tablet (1 g total) by mouth 4 (four) times daily. 08/14/12   Gertha Calkin, PA-C  ziprasidone (GEODON) 40 MG capsule Take 40 mg by mouth 2 (two) times daily with a meal.    Historical Provider, MD    Family History No family history on file.  Social History Social History  Substance Use Topics  . Smoking status: Never Smoker  . Smokeless tobacco: Never Used  . Alcohol use No     Allergies   Claritin-d 12 hour [loratadine-pseudoephedrine er]; Geodon [ziprasidone hcl]; Percocet  [oxycodone-acetaminophen]; Lamictal [lamotrigine]; and Tape   Review of Systems Review of Systems  All other systems reviewed and are negative.    Physical Exam Triage Vital Signs ED Triage Vitals  Enc Vitals Group     BP 02/17/16 1439 121/80     Pulse Rate 02/17/16 1439 91     Resp 02/17/16 1439 16     Temp 02/17/16 1439 98.8 F (37.1 C)     Temp Source 02/17/16 1439 Oral     SpO2 02/17/16 1439 99 %     Weight 02/17/16 1441 246 lb (111.6 kg)     Height 02/17/16 1441 5\' 4"  (1.626 m)     Head Circumference --      Peak Flow --      Pain Score 02/17/16 1450 0     Pain Loc --      Pain Edu? --      Excl. in [place]? --    No data found.   Updated Vital Signs BP 121/80 (BP Location: Left Arm)   Pulse 91   Temp 98.8 F (37.1 C) (Oral)   Resp 16   Ht 5\' 4"  (1.626 m)   Wt 246 lb (111.6 kg)   SpO2 99%   BMI 42.23 kg/m   Visual Acuity Right Eye Distance:   Left Eye Distance:   Bilateral Distance:    Right Eye Near:   Left Eye Near:    Bilateral Near:     Physical Exam  Constitutional: She is oriented to person, place, and time. She appears well-developed and well-nourished.  HENT:  Head: Normocephalic and atraumatic.  Right Ear: Hearing, tympanic membrane, external ear and ear canal normal.  Left Ear: Hearing, tympanic membrane, external ear and ear canal normal.  Nose: Mucosal edema and rhinorrhea present. Right sinus exhibits maxillary sinus tenderness. Left sinus exhibits maxillary sinus tenderness.  Mouth/Throat: No uvula swelling. Posterior oropharyngeal erythema present.  Eyes: Pupils are equal, round, and reactive to light.  Neck: Normal range of motion. Neck supple.  Cardiovascular: Normal rate.  Exam reveals distant heart sounds.   Pulmonary/Chest: Effort normal and breath sounds normal.  Musculoskeletal: Normal range of motion.  Lymphadenopathy:    She has cervical adenopathy.  Neurological: She is alert and oriented to person, place, and time.  Skin:  Skin is warm.  Psychiatric: She has a normal mood and affect.  Vitals reviewed.    UC Treatments / Results  Labs (all labs ordered are listed, but only abnormal results are displayed) Labs Reviewed - No data to display  EKG  EKG Interpretation None       Radiology Dg Chest 2 View  Result Date: 02/17/2016  CLINICAL DATA:  Cough EXAM: CHEST  2 VIEW COMPARISON:  09/15/2013 chest radiograph. FINDINGS: Stable cardiomediastinal silhouette with normal heart size. No pneumothorax. No pleural effusion. Lungs appear clear, with no acute consolidative airspace disease and no pulmonary edema. IMPRESSION: No active cardiopulmonary disease. Electronically Signed   By: Ilona Sorrel M.D.   On: 02/17/2016 15:49    Procedures Procedures (including critical care time)  Medications Ordered in UC Medications  ipratropium-albuterol (DUONEB) 0.5-2.5 (3) MG/3ML nebulizer solution 3 mL (3 mLs Nebulization Given 02/17/16 1540)     Initial Impression / Assessment and Plan / UC Course  I have reviewed the triage vital signs and the nursing notes.  Pertinent labs & imaging results that were available during my care of the patient were reviewed by me and considered in my medical decision making (see chart for details).  Clinical Course    With chest x-ray being negative will treat her with Ceftin 500 mg twice a day and Tessalon Perles for the cough. Will use Ventolin inhaler per patient's request and will place on 6 day course of prednisone as well. Recommend follow-up for PCP in 3-5 days if not better. He declines work note because for disability she was given a breathing treatment while here and seemed to have improvement with the breathing treatment  Final Clinical Impressions(s) / UC Diagnoses   Final diagnoses:  Acute bronchiolitis with bronchospasm  Acute sinusitis, recurrence not specified, unspecified location    New Prescriptions New Prescriptions   ALBUTEROL (PROVENTIL HFA;VENTOLIN  HFA) 108 (90 BASE) MCG/ACT INHALER    Inhale 2 puffs into the lungs every 6 (six) hours as needed for wheezing or shortness of breath. Patient can only tolerate ventolin inhaler   BENZONATATE (TESSALON) 100 MG CAPSULE    Take 1 capsule (100 mg total) by mouth every 8 (eight) hours.   CEFUROXIME (CEFTIN) 500 MG TABLET    Take 1 tablet (500 mg total) by mouth 2 (two) times daily.   PREDNISONE (STERAPRED UNI-PAK 21 TAB) 10 MG (21) TBPK TABLET    Sig 6 tablet day 1, 5 tablets day 2, 4 tablets day 3,,3tablets day 4, 2 tablets day 5, 1 tablet day 6 take all tablets orally     Frederich Cha, MD 02/17/16 [phone]

## 2016-03-10 ENCOUNTER — Ambulatory Visit: Payer: Medicare HMO | Attending: Neurology

## 2016-03-10 DIAGNOSIS — R413 Other amnesia: Secondary | ICD-10-CM | POA: Insufficient documentation

## 2016-03-10 DIAGNOSIS — Z7984 Long term (current) use of oral hypoglycemic drugs: Secondary | ICD-10-CM | POA: Diagnosis not present

## 2016-03-10 DIAGNOSIS — Z79899 Other long term (current) drug therapy: Secondary | ICD-10-CM | POA: Diagnosis not present

## 2016-03-10 DIAGNOSIS — G4733 Obstructive sleep apnea (adult) (pediatric): Secondary | ICD-10-CM | POA: Diagnosis not present

## 2016-03-10 DIAGNOSIS — Z888 Allergy status to other drugs, medicaments and biological substances status: Secondary | ICD-10-CM | POA: Diagnosis not present

## 2016-03-10 DIAGNOSIS — N289 Disorder of kidney and ureter, unspecified: Secondary | ICD-10-CM | POA: Diagnosis not present

## 2016-03-10 DIAGNOSIS — I10 Essential (primary) hypertension: Secondary | ICD-10-CM | POA: Diagnosis not present

## 2016-03-10 DIAGNOSIS — Z885 Allergy status to narcotic agent status: Secondary | ICD-10-CM | POA: Diagnosis not present

## 2016-03-10 DIAGNOSIS — G4730 Sleep apnea, unspecified: Secondary | ICD-10-CM | POA: Diagnosis present

## 2016-05-04 DIAGNOSIS — G4733 Obstructive sleep apnea (adult) (pediatric): Secondary | ICD-10-CM | POA: Diagnosis not present

## 2016-05-06 DIAGNOSIS — R635 Abnormal weight gain: Secondary | ICD-10-CM | POA: Diagnosis not present

## 2016-05-06 DIAGNOSIS — R0602 Shortness of breath: Secondary | ICD-10-CM | POA: Diagnosis not present

## 2016-05-06 DIAGNOSIS — I420 Dilated cardiomyopathy: Secondary | ICD-10-CM | POA: Diagnosis not present

## 2016-05-06 DIAGNOSIS — G4733 Obstructive sleep apnea (adult) (pediatric): Secondary | ICD-10-CM | POA: Diagnosis not present

## 2016-05-06 DIAGNOSIS — Z6841 Body Mass Index (BMI) 40.0 and over, adult: Secondary | ICD-10-CM | POA: Diagnosis not present

## 2016-05-07 DIAGNOSIS — E669 Obesity, unspecified: Secondary | ICD-10-CM | POA: Diagnosis not present

## 2016-05-24 ENCOUNTER — Emergency Department: Payer: PPO

## 2016-05-24 ENCOUNTER — Emergency Department
Admission: EM | Admit: 2016-05-24 | Discharge: 2016-05-24 | Disposition: A | Discharge status: Home / Self Care | Payer: PPO | Attending: Emergency Medicine | Admitting: Emergency Medicine

## 2016-05-24 ENCOUNTER — Encounter: Payer: Self-pay | Admitting: Medical Oncology

## 2016-05-24 DIAGNOSIS — R1011 Right upper quadrant pain: Secondary | ICD-10-CM | POA: Insufficient documentation

## 2016-05-24 DIAGNOSIS — J45909 Unspecified asthma, uncomplicated: Secondary | ICD-10-CM | POA: Diagnosis not present

## 2016-05-24 DIAGNOSIS — R109 Unspecified abdominal pain: Secondary | ICD-10-CM

## 2016-05-24 DIAGNOSIS — Z79899 Other long term (current) drug therapy: Secondary | ICD-10-CM | POA: Insufficient documentation

## 2016-05-24 DIAGNOSIS — E119 Type 2 diabetes mellitus without complications: Secondary | ICD-10-CM | POA: Diagnosis not present

## 2016-05-24 DIAGNOSIS — R110 Nausea: Secondary | ICD-10-CM | POA: Diagnosis not present

## 2016-05-24 LAB — COMPREHENSIVE METABOLIC PANEL
ALT: 35 U/L (ref 14–54)
ANION GAP: 7 (ref 5–15)
AST: 42 U/L — AB (ref 15–41)
Albumin: 3.9 g/dL (ref 3.5–5.0)
Alkaline Phosphatase: 58 U/L (ref 38–126)
BILIRUBIN TOTAL: 0.4 mg/dL (ref 0.3–1.2)
BUN: 5 mg/dL — AB (ref 6–20)
CHLORIDE: 111 mmol/L (ref 101–111)
CO2: 20 mmol/L — ABNORMAL LOW (ref 22–32)
Calcium: 8.8 mg/dL — ABNORMAL LOW (ref 8.9–10.3)
Creatinine, Ser: 0.79 mg/dL (ref 0.44–1.00)
GFR calc Af Amer: >60 mL/min (ref >60)
Glucose, Bld: 113 mg/dL — ABNORMAL HIGH (ref 65–99)
POTASSIUM: 3.5 mmol/L (ref 3.5–5.1)
Sodium: 138 mmol/L (ref 135–145)
TOTAL PROTEIN: 7.6 g/dL (ref 6.5–8.1)

## 2016-05-24 LAB — CBC
HEMATOCRIT: 38.7 % (ref 35.0–47.0)
HEMOGLOBIN: 13.4 g/dL (ref 12.0–16.0)
MCH: 28.9 pg (ref 26.0–34.0)
MCHC: 34.5 g/dL (ref 32.0–36.0)
MCV: 83.9 fL (ref 80.0–100.0)
Platelets: 324 10*3/uL (ref 150–440)
RBC: 4.62 MIL/uL (ref 3.80–5.20)
RDW: 13.1 % (ref 11.5–14.5)
WBC: 9.6 10*3/uL (ref 3.6–11.0)

## 2016-05-24 LAB — URINALYSIS, COMPLETE (UACMP) WITH MICROSCOPIC
BILIRUBIN URINE: NEGATIVE
Glucose, UA: NEGATIVE mg/dL
Hgb urine dipstick: NEGATIVE
KETONES UR: NEGATIVE mg/dL
LEUKOCYTES UA: NEGATIVE
Nitrite: NEGATIVE
PH: 6 (ref 5.0–8.0)
PROTEIN: NEGATIVE mg/dL
RBC / HPF: NONE SEEN RBC/hpf (ref 0–5)
Specific Gravity, Urine: 1.005 (ref 1.005–1.030)

## 2016-05-24 LAB — LIPASE, BLOOD: LIPASE: 26 U/L (ref 11–51)

## 2016-05-24 MED ORDER — ONDANSETRON HCL 4 MG PO TABS: 4.0000 mg | ORAL_TABLET | Freq: Three times a day (TID) | ORAL | 0 refills | Status: DC | PRN

## 2016-05-24 MED ORDER — LIDOCAINE VISCOUS 2 % MT SOLN
15.0000 mL | Freq: Once | OROMUCOSAL | Status: AC
  Administered 2016-05-24: 15 mL via OROMUCOSAL

## 2016-05-24 MED ORDER — DICYCLOMINE HCL 20 MG PO TABS: 20.0000 mg | ORAL_TABLET | Freq: Three times a day (TID) | ORAL | 0 refills | Status: DC | PRN

## 2016-05-24 MED ORDER — LIDOCAINE VISCOUS 2 % MT SOLN
OROMUCOSAL | Status: AC
  Administered 2016-05-24: 15 mL via OROMUCOSAL
  Filled 2016-05-24: qty 15

## 2016-05-24 NOTE — Discharge Instructions (Signed)
Please seek medical attention for any high fevers, chest pain, shortness of breath, change in behavior, persistent vomiting, bloody stool or any other new or concerning symptoms.  

## 2016-05-24 NOTE — ED Triage Notes (Signed)
Pt reports rt sided flank pain with radiation of pain into abd that began this am.

## 2016-05-24 NOTE — ED Notes (Signed)
Pt refused discharge vitals 

## 2016-05-24 NOTE — ED Provider Notes (Signed)
[facility] Emergency Department Provider Note   ____________________________________________   I have reviewed the triage vital signs and the nursing notes.   HISTORY  Chief Complaint Flank Pain   History limited by: Not Limited   HPI Susan Tanner is a 36 y.o. female who presents to the emergency department today because of concerns for right sided abdominal pain. Patient states pain starts in the right flank comes around her right upper abdomen. The pain started this morning. Initially started earlier today and then she tried to sleep off. She woke up it was still persistent. She has had some nausea but no vomiting. She has had some diarrhea recently although denies any blood in it. No fevers. Patient has not had pain like this past.    Past Medical History:  Diagnosis Date  . Anxiety   . Asthma   . Bipolar 1 disorder ([place])   . Depression   . Diabetes mellitus without complication ([place])   . Endometriosis     There are no active problems to display for this patient.   Past Surgical History:  Procedure Laterality Date  . ABDOMINAL HYSTERECTOMY    . APPENDECTOMY    . TUBAL LIGATION      Prior to Admission medications   Medication Sig Start Date End Date Taking? Authorizing Provider  albuterol (PROVENTIL HFA;VENTOLIN HFA) 108 (90 Base) MCG/ACT inhaler Inhale 2 puffs into the lungs every 6 (six) hours as needed for wheezing or shortness of breath. Patient can only tolerate ventolin inhaler 02/17/16   Frederich Cha, MD  ALPRAZolam Duanne Moron) 1 MG tablet Take 1 mg by mouth at bedtime as needed for anxiety.    Historical Provider, MD  benzonatate (TESSALON) 100 MG capsule Take 1 capsule (100 mg total) by mouth every 8 (eight) hours. 02/17/16   Frederich Cha, MD  carvedilol (COREG) 3.125 MG tablet Take 3.125 mg by mouth 2 (two) times daily with a meal.    Historical Provider, MD  cefUROXime (CEFTIN) 500 MG tablet Take 1 tablet (500 mg total) by mouth 2 (two)  times daily. 02/17/16   Frederich Cha, MD  clonazePAM (KLONOPIN) 1 MG tablet Take 1 mg by mouth 3 (three) times daily.    Historical Provider, MD  escitalopram (LEXAPRO) 10 MG tablet Take 20 mg by mouth daily.    Historical Provider, MD  HYDROcodone-acetaminophen (NORCO/VICODIN) 5-325 MG per tablet Take 1 tablet by mouth every 6 (six) hours as needed for pain.    Historical Provider, MD  HYDROcodone-acetaminophen (NORCO/VICODIN) 5-325 MG per tablet Take 2 tablets by mouth every 4 (four) hours as needed for pain. 08/14/12   Gertha Calkin, PA-C  hydrOXYzine (ATARAX/VISTARIL) 50 MG tablet Take 50 mg by mouth 2 (two) times daily.    Historical Provider, MD  lithium carbonate (ESKALITH) 450 MG CR tablet Take by mouth 2 (two) times daily. pt takes 1 in the morning and 2 at night    Historical Provider, MD  omega-3 acid ethyl esters (LOVAZA) 1 g capsule Take by mouth 2 (two) times daily.    Historical Provider, MD  omeprazole (PRILOSEC) 20 MG capsule Take 1 capsule (20 mg total) by mouth daily. 08/14/12   Catherine Schinlever, PA-C  ondansetron (ZOFRAN) 8 MG tablet Take 1 tablet (8 mg total) by mouth every 8 (eight) hours as needed for nausea. 08/14/12   Gertha Calkin, PA-C  predniSONE (STERAPRED UNI-PAK 21 TAB) 10 MG (21) TBPK tablet Sig 6 tablet day 1, 5 tablets day 2, 4  tablets day 3,,3tablets day 4, 2 tablets day 5, 1 tablet day 6 take all tablets orally 02/17/16   Frederich Cha, MD  promethazine (PHENERGAN) 25 MG tablet Take 25 mg by mouth every 8 (eight) hours as needed for nausea.    Historical Provider, MD  QUEtiapine (SEROQUEL) 400 MG tablet Take 400 mg by mouth at bedtime.    Historical Provider, MD  sucralfate (CARAFATE) 1 G tablet Take 1 tablet (1 g total) by mouth 4 (four) times daily. 08/14/12   Gertha Calkin, PA-C  topiramate (TOPAMAX) 25 MG tablet Take 100 mg by mouth daily.    Historical Provider, MD  ziprasidone (GEODON) 40 MG capsule Take 40 mg by mouth 2 (two) times daily  with a meal.    Historical Provider, MD    Allergies Ceftin [cefuroxime axetil]; Claritin-d 12 hour [loratadine-pseudoephedrine er]; Geodon [ziprasidone hcl]; Percocet [oxycodone-acetaminophen]; Lamictal [lamotrigine]; and Tape  No family history on file.  Social History Social History  Substance Use Topics  . Smoking status: Never Smoker  . Smokeless tobacco: Never Used  . Alcohol use No    Review of Systems  Constitutional: Negative for fever. Cardiovascular: Negative for chest pain. Respiratory: Negative for shortness of breath. Gastrointestinal: Positive for right flank pain. Positive for nausea.  Genitourinary: Negative for dysuria. Neurological: Negative for headaches, focal weakness or numbness.  10-point ROS otherwise negative.  ____________________________________________   PHYSICAL EXAM:  VITAL SIGNS: ED Triage Vitals  Enc Vitals Group     BP 05/24/16 1259 129/71     Pulse Rate 05/24/16 1259 99     Resp 05/24/16 1259 20     Temp 05/24/16 1259 98.7 F (37.1 C)     Temp Source 05/24/16 1259 Oral     SpO2 05/24/16 1259 98 %     Weight 05/24/16 1252 250 lb (113.4 kg)     Height 05/24/16 1252 5\' 4"  (1.626 m)     Head Circumference --      Peak Flow --      Pain Score 05/24/16 1252 7    Constitutional: Alert and oriented. Well appearing and in no distress. Eyes: Conjunctivae are normal. Normal extraocular movements. ENT   Head: Normocephalic and atraumatic.   Nose: No congestion/rhinnorhea.   Mouth/Throat: Mucous membranes are moist.   Neck: No stridor. Hematological/Lymphatic/Immunilogical: No cervical lymphadenopathy. Cardiovascular: Normal rate, regular rhythm.  No murmurs, rubs, or gallops.  Respiratory: Normal respiratory effort without tachypnea nor retractions. Breath sounds are clear and equal bilaterally. No wheezes/rales/rhonchi. Gastrointestinal: Soft and slightly tender to palpation in the right upper quadrant.  Genitourinary:  Deferred Musculoskeletal: Normal range of motion in all extremities. No lower extremity edema. Neurologic:  Normal speech and language. No gross focal neurologic deficits are appreciated.  Skin:  Skin is warm, dry and intact. No rash noted. Psychiatric: Mood and affect are normal. Speech and behavior are normal. Patient exhibits appropriate insight and judgment.  ____________________________________________    LABS (pertinent positives/negatives)  Labs Reviewed  COMPREHENSIVE METABOLIC PANEL - Abnormal; Notable for the following:       Result Value   CO2 20 (*)    Glucose, Bld 113 (*)    BUN 5 (*)    Calcium 8.8 (*)    AST 42 (*)    All other components within normal limits  URINALYSIS, COMPLETE (UACMP) WITH MICROSCOPIC - Abnormal; Notable for the following:    Color, Urine YELLOW (*)    APPearance CLEAR (*)    Bacteria, UA RARE (*)  Squamous Epithelial / LPF 0-5 (*)    All other components within normal limits  LIPASE, BLOOD  CBC     ____________________________________________   EKG  None  ____________________________________________    RADIOLOGY  US  IMPRESSION: 1. No acute abnormality at the right upper quadrant. 2. Diffuse fatty infiltration within the liver.  ____________________________________________   PROCEDURES  Procedures  ____________________________________________   INITIAL IMPRESSION / ASSESSMENT AND PLAN / ED COURSE  Pertinent labs & imaging results that were available during my care of the patient were reviewed by me and considered in my medical decision making (see chart for details).  Patient presented to the emergency department today because of concerns for right flank and right upper quadrant abdominal pain. On exam patient is minimally tender in the right upper quadrant. Ultrasound was performed which did not show any concerning findings. Patient blood work without any leukocytosis. Urine without signs of infection or blood. At  this point unclear etiology. However do feel that patient is safe for discharge home. Did discuss return precautions. While patient follow-up with primary care.  ____________________________________________   FINAL CLINICAL IMPRESSION(S) / ED DIAGNOSES  Final diagnoses:  RUQ pain  Right flank pain     Note: This dictation was prepared with Dragon dictation. Any transcriptional errors that result from this process are unintentional     Nance Pear, MD 05/24/16 1810

## 2016-06-03 DIAGNOSIS — F315 Bipolar disorder, current episode depressed, severe, with psychotic features: Secondary | ICD-10-CM | POA: Diagnosis not present

## 2016-06-03 DIAGNOSIS — F4312 Post-traumatic stress disorder, chronic: Secondary | ICD-10-CM | POA: Diagnosis not present

## 2016-06-03 DIAGNOSIS — F259 Schizoaffective disorder, unspecified: Secondary | ICD-10-CM | POA: Diagnosis not present

## 2016-06-04 DIAGNOSIS — G4733 Obstructive sleep apnea (adult) (pediatric): Secondary | ICD-10-CM | POA: Diagnosis not present

## 2016-06-11 DIAGNOSIS — F411 Generalized anxiety disorder: Secondary | ICD-10-CM | POA: Diagnosis not present

## 2016-06-11 DIAGNOSIS — E669 Obesity, unspecified: Secondary | ICD-10-CM | POA: Diagnosis not present

## 2016-06-11 DIAGNOSIS — E781 Pure hyperglyceridemia: Secondary | ICD-10-CM | POA: Diagnosis not present

## 2016-06-12 DIAGNOSIS — D485 Neoplasm of uncertain behavior of skin: Secondary | ICD-10-CM | POA: Diagnosis not present

## 2016-06-12 DIAGNOSIS — R209 Unspecified disturbances of skin sensation: Secondary | ICD-10-CM | POA: Diagnosis not present

## 2016-06-12 DIAGNOSIS — L918 Other hypertrophic disorders of the skin: Secondary | ICD-10-CM | POA: Diagnosis not present

## 2016-06-12 DIAGNOSIS — D225 Melanocytic nevi of trunk: Secondary | ICD-10-CM | POA: Diagnosis not present

## 2016-06-12 DIAGNOSIS — D229 Melanocytic nevi, unspecified: Secondary | ICD-10-CM | POA: Diagnosis not present

## 2016-07-02 DIAGNOSIS — G4733 Obstructive sleep apnea (adult) (pediatric): Secondary | ICD-10-CM | POA: Diagnosis not present

## 2016-07-09 DIAGNOSIS — G4733 Obstructive sleep apnea (adult) (pediatric): Secondary | ICD-10-CM | POA: Diagnosis not present

## 2016-08-02 DIAGNOSIS — G4733 Obstructive sleep apnea (adult) (pediatric): Secondary | ICD-10-CM | POA: Diagnosis not present

## 2016-08-08 DIAGNOSIS — F259 Schizoaffective disorder, unspecified: Secondary | ICD-10-CM | POA: Diagnosis not present

## 2016-08-08 DIAGNOSIS — Z5181 Encounter for therapeutic drug level monitoring: Secondary | ICD-10-CM | POA: Diagnosis not present

## 2016-08-08 DIAGNOSIS — F4312 Post-traumatic stress disorder, chronic: Secondary | ICD-10-CM | POA: Diagnosis not present

## 2016-08-20 DIAGNOSIS — K644 Residual hemorrhoidal skin tags: Secondary | ICD-10-CM | POA: Diagnosis not present

## 2016-08-20 DIAGNOSIS — E669 Obesity, unspecified: Secondary | ICD-10-CM | POA: Diagnosis not present

## 2016-08-20 DIAGNOSIS — F439 Reaction to severe stress, unspecified: Secondary | ICD-10-CM | POA: Diagnosis not present

## 2016-08-20 DIAGNOSIS — E781 Pure hyperglyceridemia: Secondary | ICD-10-CM | POA: Diagnosis not present

## 2016-09-01 DIAGNOSIS — G4733 Obstructive sleep apnea (adult) (pediatric): Secondary | ICD-10-CM | POA: Diagnosis not present

## 2016-09-02 ENCOUNTER — Encounter: Payer: Self-pay | Admitting: Emergency Medicine

## 2016-09-02 DIAGNOSIS — Z79899 Other long term (current) drug therapy: Secondary | ICD-10-CM | POA: Insufficient documentation

## 2016-09-02 DIAGNOSIS — R0602 Shortness of breath: Secondary | ICD-10-CM | POA: Insufficient documentation

## 2016-09-02 DIAGNOSIS — J45909 Unspecified asthma, uncomplicated: Secondary | ICD-10-CM | POA: Insufficient documentation

## 2016-09-02 DIAGNOSIS — R079 Chest pain, unspecified: Secondary | ICD-10-CM | POA: Diagnosis not present

## 2016-09-02 DIAGNOSIS — E119 Type 2 diabetes mellitus without complications: Secondary | ICD-10-CM | POA: Diagnosis not present

## 2016-09-02 DIAGNOSIS — M79602 Pain in left arm: Secondary | ICD-10-CM | POA: Diagnosis not present

## 2016-09-02 NOTE — ED Triage Notes (Signed)
Pt ambulatory to triage with steady gait. Pt c/o right sided chest pain and LEFT arm pain since 9 pm tonight accompanied by shortness of breath. Pt a&o x 4, respirations even and unlabored, skin warm and dry.

## 2016-09-03 ENCOUNTER — Emergency Department
Admission: EM | Admit: 2016-09-03 | Discharge: 2016-09-03 | Disposition: A | Discharge status: Home / Self Care | Payer: PPO | Attending: Emergency Medicine | Admitting: Emergency Medicine

## 2016-09-03 ENCOUNTER — Emergency Department: Payer: PPO

## 2016-09-03 DIAGNOSIS — M79602 Pain in left arm: Secondary | ICD-10-CM | POA: Diagnosis not present

## 2016-09-03 DIAGNOSIS — R079 Chest pain, unspecified: Secondary | ICD-10-CM

## 2016-09-03 DIAGNOSIS — R0602 Shortness of breath: Secondary | ICD-10-CM | POA: Diagnosis not present

## 2016-09-03 LAB — BASIC METABOLIC PANEL
Anion gap: 8 (ref 5–15)
BUN: 6 mg/dL (ref 6–20)
CHLORIDE: 106 mmol/L (ref 101–111)
CO2: 24 mmol/L (ref 22–32)
Calcium: 9.2 mg/dL (ref 8.9–10.3)
Creatinine, Ser: 0.84 mg/dL (ref 0.44–1.00)
GFR calc non Af Amer: >60 mL/min (ref >60)
Glucose, Bld: 154 mg/dL — ABNORMAL HIGH (ref 65–99)
POTASSIUM: 3.3 mmol/L — AB (ref 3.5–5.1)
SODIUM: 138 mmol/L (ref 135–145)

## 2016-09-03 LAB — CBC
HEMATOCRIT: 36.6 % (ref 35.0–47.0)
Hemoglobin: 12.2 g/dL (ref 12.0–16.0)
MCH: 27.8 pg (ref 26.0–34.0)
MCHC: 33.5 g/dL (ref 32.0–36.0)
MCV: 82.9 fL (ref 80.0–100.0)
Platelets: 324 10*3/uL (ref 150–440)
RBC: 4.41 MIL/uL (ref 3.80–5.20)
RDW: 14 % (ref 11.5–14.5)
WBC: 14.4 10*3/uL — AB (ref 3.6–11.0)

## 2016-09-03 LAB — BRAIN NATRIURETIC PEPTIDE: B NATRIURETIC PEPTIDE 5: 27 pg/mL (ref 0.0–100.0)

## 2016-09-03 LAB — TROPONIN I
Troponin I: <0.03 ng/mL (ref <0.03)
Troponin I: <0.03 ng/mL (ref <0.03)

## 2016-09-03 MED ORDER — ACETAMINOPHEN 500 MG PO TABS
1000.0000 mg | ORAL_TABLET | Freq: Once | ORAL | Status: AC
  Administered 2016-09-03: 1000 mg via ORAL
  Filled 2016-09-03: qty 2

## 2016-09-03 MED ORDER — IPRATROPIUM-ALBUTEROL 0.5-2.5 (3) MG/3ML IN SOLN
3.0000 mL | Freq: Once | RESPIRATORY_TRACT | Status: AC
  Administered 2016-09-03: 3 mL via RESPIRATORY_TRACT
  Filled 2016-09-03: qty 3

## 2016-09-03 MED ORDER — IBUPROFEN 600 MG PO TABS
600.0000 mg | ORAL_TABLET | Freq: Once | ORAL | Status: DC
  Filled 2016-09-03: qty 1

## 2016-09-03 NOTE — Discharge Instructions (Signed)
Please follow up with cardiology for further evaluation

## 2016-09-03 NOTE — ED Provider Notes (Signed)
[facility] Emergency Department Provider Note   ____________________________________________   First MD Initiated Contact with Patient 09/03/16 [id number]     (approximate)  I have reviewed the triage vital signs and the nursing notes.   HISTORY  Chief Complaint Chest Pain and Shortness of Breath  HPI Susan Tanner is a 36 y.o. female who comes into the hospital today with some left arm pain and chest pain. She reports that the pain started in her left wrist about 6 PM. It moved up her arm. She decided to lay down and put on her CPAP machine and his sugar having some chest pain and shortness of breath. She reports that she used her asthma inhaler and it still did not help. The patient's son decided to call 911. The patient was found to have some elevated blood pressure in the 017 systolic. She reports that she does have an enlarged heart and noticed some swelling in her feet earlier this week. She is unsure she's having heart attacks or she decided to come into the hospital to get checked out. She reports that the chest pain is gone but she still felt a little tight in her chest and she still has some arm discomfort. The patient reports that the pain is in her entire arm and she rates it a 4 out of 10 in intensity. The patient's shortness of breath is better but she was nauseous and dizzy. The patient denies sweats. She is here today for evaluation of the symptoms.   Past Medical History:  Diagnosis Date  . Anxiety   . Asthma   . Bipolar 1 disorder ([place])   . Depression   . Diabetes mellitus without complication ([place])   . Endometriosis     There are no active problems to display for this patient.   Past Surgical History:  Procedure Laterality Date  . ABDOMINAL HYSTERECTOMY    . APPENDECTOMY    . TUBAL LIGATION      Prior to Admission medications   Medication Sig Start Date End Date Taking? Authorizing Provider  albuterol (PROVENTIL HFA;VENTOLIN HFA) 108  (90 Base) MCG/ACT inhaler Inhale 2 puffs into the lungs every 6 (six) hours as needed for wheezing or shortness of breath. Patient can only tolerate ventolin inhaler 02/17/16   Frederich Cha, MD  ALPRAZolam Duanne Moron) 1 MG tablet Take 1 mg by mouth at bedtime as needed for anxiety.    [provider]  benzonatate (TESSALON) 100 MG capsule Take 1 capsule (100 mg total) by mouth every 8 (eight) hours. 02/17/16   Frederich Cha, MD  carvedilol (COREG) 3.125 MG tablet Take 3.125 mg by mouth 2 (two) times daily with a meal.    [provider]  cefUROXime (CEFTIN) 500 MG tablet Take 1 tablet (500 mg total) by mouth 2 (two) times daily. 02/17/16   Frederich Cha, MD  clonazePAM (KLONOPIN) 1 MG tablet Take 1 mg by mouth 3 (three) times daily.    [provider]  dicyclomine (BENTYL) 20 MG tablet Take 1 tablet (20 mg total) by mouth 3 (three) times daily as needed (abdominal pain). 05/24/16   Nance Pear, MD  escitalopram (LEXAPRO) 10 MG tablet Take 20 mg by mouth daily.    [provider]  HYDROcodone-acetaminophen (NORCO/VICODIN) 5-325 MG per tablet Take 1 tablet by mouth every 6 (six) hours as needed for pain.    [provider]  HYDROcodone-acetaminophen (NORCO/VICODIN) 5-325 MG per tablet Take 2 tablets by mouth every 4 (  four) hours as needed for pain. 08/14/12   Schinlever, Barnetta [place], PA-C  hydrOXYzine (ATARAX/VISTARIL) 50 MG tablet Take 50 mg by mouth 2 (two) times daily.    [provider]  lithium carbonate (ESKALITH) 450 MG CR tablet Take by mouth 2 (two) times daily. pt takes 1 in the morning and 2 at night    [provider]  omega-3 acid ethyl esters (LOVAZA) 1 g capsule Take by mouth 2 (two) times daily.    [provider]  omeprazole (PRILOSEC) 20 MG capsule Take 1 capsule (20 mg total) by mouth daily. 08/14/12   Schinlever, Barnetta Chapel, PA-C  ondansetron (ZOFRAN) 4 MG tablet Take 1 tablet (4 mg total) by mouth every 8 (eight) hours  as needed for nausea or vomiting. 05/24/16   Nance Pear, MD  ondansetron (ZOFRAN) 8 MG tablet Take 1 tablet (8 mg total) by mouth every 8 (eight) hours as needed for nausea. 08/14/12   Schinlever, Barnetta Chapel, PA-C  predniSONE (STERAPRED UNI-PAK 21 TAB) 10 MG (21) TBPK tablet Sig 6 tablet day 1, 5 tablets day 2, 4 tablets day 3,,3tablets day 4, 2 tablets day 5, 1 tablet day 6 take all tablets orally 02/17/16   Frederich Cha, MD  promethazine (PHENERGAN) 25 MG tablet Take 25 mg by mouth every 8 (eight) hours as needed for nausea.    [provider]  QUEtiapine (SEROQUEL) 400 MG tablet Take 400 mg by mouth at bedtime.    [provider]  sucralfate (CARAFATE) 1 G tablet Take 1 tablet (1 g total) by mouth 4 (four) times daily. 08/14/12   Schinlever, Barnetta Chapel, PA-C  topiramate (TOPAMAX) 25 MG tablet Take 100 mg by mouth daily.    [provider]  ziprasidone (GEODON) 40 MG capsule Take 40 mg by mouth 2 (two) times daily with a meal.    [provider]    Allergies Ceftin [cefuroxime axetil]; Claritin-d 12 hour [loratadine-pseudoephedrine er]; Geodon [ziprasidone hcl]; Percocet [oxycodone-acetaminophen]; Lamictal [lamotrigine]; and Tape  No family history on file.  Social History Social History  Substance Use Topics  . Smoking status: Never Smoker  . Smokeless tobacco: Never Used  . Alcohol use No    Review of Systems  Constitutional: No fever/chills Eyes: No visual changes. ENT: No sore throat. Cardiovascular: chest pain. Respiratory:  shortness of breath. Gastrointestinal: No abdominal pain.  No nausea, no vomiting.  No diarrhea.  No constipation. Genitourinary: Negative for dysuria. Musculoskeletal: Left arm pain Skin: Negative for rash. Neurological: Negative for headaches, focal weakness or numbness.   ____________________________________________   PHYSICAL EXAM:  VITAL SIGNS: ED Triage Vitals  Enc Vitals Group     BP 09/03/16 0401 (!)  143/90     Pulse Rate 09/03/16 0401 87     Resp 09/03/16 0401 14     Temp --      Temp src --      SpO2 09/03/16 0401 99 %     Weight 09/02/16 2357 264 lb (119.7 kg)     Height 09/02/16 2357 5\' 4"  (1.626 m)     Head Circumference --      Peak Flow --      Pain Score 09/02/16 2356 6     Pain Loc --      Pain Edu? --      Excl. in [place]? --     Constitutional: Alert and oriented. Well appearing and in mild distress. Eyes: Conjunctivae are normal. PERRL. EOMI. Head: Atraumatic. Nose: No congestion/rhinnorhea. Mouth/Throat: Mucous  membranes are moist.  Oropharynx non-erythematous. Cardiovascular: Normal rate, regular rhythm. Grossly normal heart sounds.  Good peripheral circulation. Respiratory: Normal respiratory effort.  No retractions. Lungs CTAB. Gastrointestinal: Soft and nontender. No distention. Positive bowel sounds Musculoskeletal: No lower extremity tenderness nor edema.   Neurologic:  Normal speech and language.  Skin:  Skin is warm, dry and intact.  Psychiatric: Mood and affect are normal.   ____________________________________________   LABS (all labs ordered are listed, but only abnormal results are displayed)  Labs Reviewed  BASIC METABOLIC PANEL - Abnormal; Notable for the following:       Result Value   Potassium 3.3 (*)    Glucose, Bld 154 (*)    All other components within normal limits  CBC - Abnormal; Notable for the following:    WBC 14.4 (*)    All other components within normal limits  TROPONIN I  TROPONIN I  BRAIN NATRIURETIC PEPTIDE   ____________________________________________  EKG  ED ECG REPORT I, Loney Hering, the attending physician, personally viewed and interpreted this ECG.   Date: 09/02/2016  EKG Time: 2355  Rate: 86  Rhythm: normal sinus rhythm  Axis: normal  Intervals:none  ST&T Change: none  ____________________________________________  RADIOLOGY  CXR US venous  RUE ____________________________________________   PROCEDURES  Procedure(s) performed: None  Procedures  Critical Care performed: No  ____________________________________________   INITIAL IMPRESSION / ASSESSMENT AND PLAN / ED COURSE  Pertinent labs & imaging results that were available during my care of the patient were reviewed by me and considered in my medical decision making (see chart for details).  This is a 36 year old female who comes into the hospital today with some left arm pain and chest pain. The patient reports her pain is improved by the time I saw her. I did send her for an ultrasound to upper arm for further evaluation. I did give the patient some Tylenol for her pain as well. She states that she is unable to take ibuprofen because she is on lithium. The patient's ultrasound is negative and her repeat troponin is negative. The patient did have a BNP because she had some swelling but that is also unremarkable. I feel that the patient should follow-up with cardiology. I discussed this with the patient and she understands plan as stated. She'll be discharged home to follow-up.  Clinical Course as of Sep 03 817  Tue Sep 03, 2016  0417 No active cardiopulmonary disease. DG Chest 2 View [AW]  [phone] No evidence of DVT within the left upper extremity. US Venous Img Upper Uni Left [AW]    Clinical Course User Index [AW] Loney Hering, MD     ____________________________________________   FINAL CLINICAL IMPRESSION(S) / ED DIAGNOSES  Final diagnoses:  Chest pain, unspecified type  Shortness of breath  Pain of left upper extremity      NEW MEDICATIONS STARTED DURING THIS VISIT:  New Prescriptions   No medications on file     Note:  This document was prepared using Dragon voice recognition software and may include unintentional dictation errors.    Loney Hering, MD 09/03/16 [phone]

## 2016-09-09 DIAGNOSIS — G4733 Obstructive sleep apnea (adult) (pediatric): Secondary | ICD-10-CM | POA: Diagnosis not present

## 2016-09-09 DIAGNOSIS — R0602 Shortness of breath: Secondary | ICD-10-CM | POA: Diagnosis not present

## 2016-09-09 DIAGNOSIS — R635 Abnormal weight gain: Secondary | ICD-10-CM | POA: Diagnosis not present

## 2016-09-09 DIAGNOSIS — E782 Mixed hyperlipidemia: Secondary | ICD-10-CM | POA: Diagnosis not present

## 2016-09-09 DIAGNOSIS — I420 Dilated cardiomyopathy: Secondary | ICD-10-CM | POA: Diagnosis not present

## 2016-09-09 DIAGNOSIS — Z6841 Body Mass Index (BMI) 40.0 and over, adult: Secondary | ICD-10-CM | POA: Diagnosis not present

## 2016-09-09 DIAGNOSIS — E119 Type 2 diabetes mellitus without complications: Secondary | ICD-10-CM | POA: Diagnosis not present

## 2016-09-19 DIAGNOSIS — F259 Schizoaffective disorder, unspecified: Secondary | ICD-10-CM | POA: Diagnosis not present

## 2016-10-02 DIAGNOSIS — M79661 Pain in right lower leg: Secondary | ICD-10-CM | POA: Diagnosis not present

## 2016-10-02 DIAGNOSIS — G4733 Obstructive sleep apnea (adult) (pediatric): Secondary | ICD-10-CM | POA: Diagnosis not present

## 2016-10-02 DIAGNOSIS — S8011XA Contusion of right lower leg, initial encounter: Secondary | ICD-10-CM | POA: Diagnosis not present

## 2016-10-02 DIAGNOSIS — M79604 Pain in right leg: Secondary | ICD-10-CM | POA: Diagnosis not present

## 2016-10-10 ENCOUNTER — Ambulatory Visit: Payer: Self-pay | Admitting: Obstetrics and Gynecology

## 2016-10-14 DIAGNOSIS — E782 Mixed hyperlipidemia: Secondary | ICD-10-CM | POA: Diagnosis not present

## 2016-10-14 DIAGNOSIS — I493 Ventricular premature depolarization: Secondary | ICD-10-CM | POA: Diagnosis not present

## 2016-10-14 DIAGNOSIS — G4733 Obstructive sleep apnea (adult) (pediatric): Secondary | ICD-10-CM | POA: Diagnosis not present

## 2016-10-14 DIAGNOSIS — R002 Palpitations: Secondary | ICD-10-CM | POA: Diagnosis not present

## 2016-10-14 DIAGNOSIS — I420 Dilated cardiomyopathy: Secondary | ICD-10-CM | POA: Diagnosis not present

## 2016-10-16 DIAGNOSIS — I493 Ventricular premature depolarization: Secondary | ICD-10-CM | POA: Insufficient documentation

## 2016-10-17 ENCOUNTER — Other Ambulatory Visit (INDEPENDENT_AMBULATORY_CARE_PROVIDER_SITE_OTHER): Payer: PPO

## 2016-10-17 ENCOUNTER — Ambulatory Visit (INDEPENDENT_AMBULATORY_CARE_PROVIDER_SITE_OTHER): Payer: PPO | Admitting: Obstetrics and Gynecology

## 2016-10-17 ENCOUNTER — Encounter: Payer: Self-pay | Admitting: Obstetrics and Gynecology

## 2016-10-17 VITALS — BP 120/80 | HR 93 | Ht 64.0 in | Wt 262.0 lb

## 2016-10-17 DIAGNOSIS — R102 Pelvic and perineal pain: Secondary | ICD-10-CM | POA: Diagnosis not present

## 2016-10-17 NOTE — Addendum Note (Signed)
Addended by: Quintella Baton D on: 10/17/2016 11:23 AM   Modules accepted: Orders

## 2016-10-17 NOTE — Progress Notes (Addendum)
Chief Complaint  Patient presents with  . Pelvic Pain    HPI:      [name] is a 36 y.o. [phone] who LMP was No LMP recorded. Patient has had a hysterectomy., presents today for pelvic pain, BLQ, for the past 2-3 months. Sx are random and sharp when they first start. Pt takes tylenol with sx relief. Sx triggered sometimes the day after sex, but no other aggrav factors. Pt is s/p TAH several yrs ago for endometriosis and pelvic pain. Sx resolved until recently. She also has a hx of ovar cysts in the past. She denies any urin sx, vag sx with pain. She does have diarrhea with pain but has that regularly anyway. She does not notice a pattern related to her cycle.  She did OCPs years ago and didn't tolerate them well. She never tried depo or lupron for endometriosis.    There are no active problems to display for this patient.  Past Medical History:  Diagnosis Date  . Anxiety   . Asthma   . Bipolar 1 disorder ([place])   . Depression   . Diabetes mellitus without complication ([place])   . Endometriosis   . Heart abnormality    Past Surgical History:  Procedure Laterality Date  . ABDOMINAL HYSTERECTOMY    . APPENDECTOMY    . TUBAL LIGATION       Family History  Problem Relation Age of Onset  . Diabetes Maternal Grandfather   . Diabetes Paternal Grandfather   . Cancer Cousin   . Brain cancer Cousin     Social History   Social History  . Marital status: Married    Spouse name: N/A  . Number of children: N/A  . Years of education: N/A   Occupational History  . Not on file.   Social History Main Topics  . Smoking status: Never Smoker  . Smokeless tobacco: Never Used  . Alcohol use No  . Drug use: No  . Sexual activity: Yes   Other Topics Concern  . Not on file   Social History Narrative  . No narrative on file     Current Outpatient Prescriptions:  .  albuterol (PROVENTIL HFA;VENTOLIN HFA) 108 (90 Base) MCG/ACT inhaler, Inhale 2 puffs into the lungs every  6 (six) hours as needed for wheezing or shortness of breath. Patient can only tolerate ventolin inhaler, Disp: 1 Inhaler, Rfl: 0 .  ALPRAZolam (XANAX) 1 MG tablet, Take 1 mg by mouth at bedtime as needed for anxiety., Disp: , Rfl:  .  carvedilol (COREG) 3.125 MG tablet, Take 3.125 mg by mouth 2 (two) times daily with a meal., Disp: , Rfl:  .  hydrOXYzine (ATARAX/VISTARIL) 50 MG tablet, Take 50 mg by mouth Nightly. , Disp: , Rfl:  .  lithium carbonate (ESKALITH) 450 MG CR tablet, Take by mouth 2 (two) times daily. pt takes 1 in the morning and 2 at night, Disp: , Rfl:  .  QUEtiapine (SEROQUEL) 400 MG tablet, Take 400 mg by mouth at bedtime., Disp: , Rfl:  .  topiramate (TOPAMAX) 25 MG tablet, Take 50 mg by mouth 2 (two) times daily. , Disp: , Rfl:   Review of Systems  Constitutional: Negative for fever.  Gastrointestinal: Positive for diarrhea. Negative for blood in stool, constipation, nausea and vomiting.  Genitourinary: Positive for dyspareunia. Negative for dysuria, flank pain, frequency, hematuria, urgency, vaginal bleeding, vaginal discharge and vaginal pain.  Musculoskeletal: Negative for back pain.  Skin: Negative for  rash.     OBJECTIVE:   Vitals:  BP 120/80   Pulse 93   Ht 5\' 4"  (1.626 m)   Wt 262 lb (118.8 kg)   BMI 44.97 kg/m   Physical Exam  Constitutional: She is oriented to person, place, and time and well-developed, well-nourished, and in no distress. Vital signs are normal.  Abdominal: Soft. Normal appearance. There is tenderness. There is no rigidity, no rebound and no guarding.  Genitourinary: Vagina normal and vulva normal. Right adnexum displays tenderness. Right adnexum displays no mass. Left adnexum displays tenderness. Left adnexum displays no mass. Vulva exhibits no erythema, no exudate, no lesion, no rash and no tenderness. Vagina exhibits no lesion.  Genitourinary Comments: UTERUS/CX SURG ABSENT  Neurological: She is oriented to person, place, and time.    Vitals reviewed.   Results: GYN U/S-->UTERUS ABSENT; FOLLICLES BILAT OVAR; NO FF IN CDS  Assessment/Plan: Pelvic pain - For 2-3 mos. Neg GYN u/s. Most likely endometriosis based on sx/hx.  Discussed tylenol prn pain, lupron, depo, BSO. Pt wants to watch and wait. F/u with MD prn - Plan: US Transvaginal Non-OB    Return if symptoms worsen or fail to improve.  Alicia B. Copland, PA-C 10/17/2016 11:47 AM

## 2016-10-22 DIAGNOSIS — G4733 Obstructive sleep apnea (adult) (pediatric): Secondary | ICD-10-CM | POA: Diagnosis not present

## 2016-11-01 DIAGNOSIS — G4733 Obstructive sleep apnea (adult) (pediatric): Secondary | ICD-10-CM | POA: Diagnosis not present

## 2016-11-22 DIAGNOSIS — F259 Schizoaffective disorder, unspecified: Secondary | ICD-10-CM | POA: Diagnosis not present

## 2016-11-22 DIAGNOSIS — Z5181 Encounter for therapeutic drug level monitoring: Secondary | ICD-10-CM | POA: Diagnosis not present

## 2016-11-22 DIAGNOSIS — Z7689 Persons encountering health services in other specified circumstances: Secondary | ICD-10-CM | POA: Diagnosis not present

## 2016-11-28 ENCOUNTER — Encounter: Payer: Self-pay | Admitting: Obstetrics and Gynecology

## 2016-11-28 ENCOUNTER — Telehealth: Payer: Self-pay | Admitting: Obstetrics and Gynecology

## 2016-11-28 NOTE — Telephone Encounter (Signed)
Pt is calling due to her symptoms from her previous appointment for ovarian pain are back. Pt is requesting which medication that was advise at that appointment . Pt would like a call back.

## 2016-11-28 NOTE — Telephone Encounter (Signed)
Pt responded to via MyChart.

## 2016-12-02 DIAGNOSIS — G4733 Obstructive sleep apnea (adult) (pediatric): Secondary | ICD-10-CM | POA: Diagnosis not present

## 2016-12-12 DIAGNOSIS — R072 Precordial pain: Secondary | ICD-10-CM | POA: Diagnosis not present

## 2016-12-12 DIAGNOSIS — G4733 Obstructive sleep apnea (adult) (pediatric): Secondary | ICD-10-CM | POA: Diagnosis not present

## 2016-12-12 DIAGNOSIS — I420 Dilated cardiomyopathy: Secondary | ICD-10-CM | POA: Diagnosis not present

## 2016-12-12 DIAGNOSIS — Z6841 Body Mass Index (BMI) 40.0 and over, adult: Secondary | ICD-10-CM | POA: Diagnosis not present

## 2016-12-19 ENCOUNTER — Ambulatory Visit: Payer: PPO | Admitting: Certified Nurse Midwife

## 2016-12-27 DIAGNOSIS — R197 Diarrhea, unspecified: Secondary | ICD-10-CM | POA: Diagnosis not present

## 2016-12-27 DIAGNOSIS — L853 Xerosis cutis: Secondary | ICD-10-CM | POA: Diagnosis not present

## 2016-12-27 DIAGNOSIS — R739 Hyperglycemia, unspecified: Secondary | ICD-10-CM | POA: Diagnosis not present

## 2016-12-27 DIAGNOSIS — F439 Reaction to severe stress, unspecified: Secondary | ICD-10-CM | POA: Diagnosis not present

## 2017-01-01 ENCOUNTER — Ambulatory Visit (INDEPENDENT_AMBULATORY_CARE_PROVIDER_SITE_OTHER): Payer: PPO | Admitting: Obstetrics and Gynecology

## 2017-01-01 ENCOUNTER — Encounter: Payer: Self-pay | Admitting: Obstetrics and Gynecology

## 2017-01-01 ENCOUNTER — Ambulatory Visit: Payer: PPO | Admitting: Certified Nurse Midwife

## 2017-01-01 VITALS — BP 116/80 | HR 95 | Ht 64.0 in | Wt 270.0 lb

## 2017-01-01 DIAGNOSIS — Z1339 Encounter for screening examination for other mental health and behavioral disorders: Secondary | ICD-10-CM

## 2017-01-01 DIAGNOSIS — Z124 Encounter for screening for malignant neoplasm of cervix: Secondary | ICD-10-CM

## 2017-01-01 DIAGNOSIS — Z1331 Encounter for screening for depression: Secondary | ICD-10-CM

## 2017-01-01 DIAGNOSIS — Z01419 Encounter for gynecological examination (general) (routine) without abnormal findings: Secondary | ICD-10-CM

## 2017-01-01 DIAGNOSIS — N900 Mild vulvar dysplasia: Secondary | ICD-10-CM

## 2017-01-01 DIAGNOSIS — Z113 Encounter for screening for infections with a predominantly sexual mode of transmission: Secondary | ICD-10-CM

## 2017-01-01 DIAGNOSIS — N809 Endometriosis, unspecified: Secondary | ICD-10-CM

## 2017-01-01 NOTE — Progress Notes (Signed)
Gynecology Annual Exam  PCP: [facility] [facility]  Chief Complaint  Patient presents with  . Gynecologic Exam    endometriosis pain    History of Present Illness:  [name] is a 36 y.o. [phone] who LMP was No LMP recorded. Patient has had a hysterectomy., presents today for her annual examination.  Menses absent.   She is single partner, contraception - hysterectomy.  Last Pap: 1 year  Results were: ASCUS with NEGATIVE high risk HPV (cervix is absent due to TAH in 2011) Hx of STDs: none  There is no FH of breast cancer. There is no FH of ovarian cancer. The patient does not do self-breast exams.  Tobacco use: The patient denies current or previous tobacco use. Alcohol use: social drinker Exercise: not active  The patient has a very significant history of endometriosis. She has undergone a total abdominal hysterectomy in 2011.  She has had good pain control since that time until about 6 months ago. She has been having generalized suprapubic pain that does not radiate.  The pain is severe.  Denies inciting factors, apart from perhaps movement. Nothing seems to make the pain better. When present it lasts for hours at a time with no clear cyclic pattern.  Also, notes diarrhea, urinary frequency at times.  Also notes bloating.   Negative pelvic ultrasound three months ago.   The patient wears seatbelts: yes.   The patient reports that domestic violence in her life is absent.   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   Past Medical History:  Diagnosis Date  . Anxiety   . Asthma   . Bipolar 1 disorder ([place])   . Depression   . Diabetes mellitus without complication ([place])   . Endometriosis   . Heart abnormality     Past Surgical History:  Procedure Laterality Date  . ABDOMINAL HYSTERECTOMY    . APPENDECTOMY    . TUBAL LIGATION      Prior to Admission medications   Medication Sig Start Date End Date Taking? Authorizing Provider  albuterol  (PROVENTIL HFA;VENTOLIN HFA) 108 (90 Base) MCG/ACT inhaler Inhale 2 puffs into the lungs every 6 (six) hours as needed for wheezing or shortness of breath. Patient can only tolerate ventolin inhaler 02/17/16  Yes Frederich Cha, MD  ALPRAZolam Duanne Moron) 1 MG tablet Take 1 mg by mouth at bedtime as needed for anxiety.   Yes [provider]  carvedilol (COREG) 3.125 MG tablet Take 3.125 mg by mouth 2 (two) times daily with a meal.   Yes [provider]  dicyclomine (BENTYL) 20 MG tablet Take 20 mg by mouth every 6 (six) hours.   Yes [provider]  hydrOXYzine (ATARAX/VISTARIL) 50 MG tablet Take 50 mg by mouth Nightly.    Yes [provider]  lithium carbonate (ESKALITH) 450 MG CR tablet Take by mouth 2 (two) times daily. pt takes 1 in the morning and 2 at night   Yes [provider]  pantoprazole (PROTONIX) 40 MG tablet Take 40 mg by mouth daily.   Yes [provider]  QUEtiapine (SEROQUEL) 400 MG tablet Take 400 mg by mouth at bedtime.   Yes [provider]  topiramate (TOPAMAX) 25 MG tablet Take 50 mg by mouth 2 (two) times daily.    Yes [provider]    Allergies  Allergen Reactions  . Ceftin [Cefuroxime Axetil]   . Claritin-D 12 Hour [Loratadine-Pseudoephedrine Er]  Muscle pain.  [place] [Ziprasidone Hcl] Other (See Comments)    Inflames her liver, elevated LFTs  . Percocet [Oxycodone-Acetaminophen] Itching  . Lamictal [Lamotrigine] Rash  . Tape Rash   Obstetric History: [id number]  Social History   Social History  . Marital status: Married    Spouse name: N/A  . Number of children: N/A  . Years of education: N/A   Occupational History  . Not on file.   Social History Main Topics  . Smoking status: Never Smoker  . Smokeless tobacco: Never Used  . Alcohol use No  . Drug use: No  . Sexual activity: Yes    Birth control/ protection: None     Comment: Hysterectomy   Other Topics Concern  . Not on file     Social History Narrative  . No narrative on file    Family History  Problem Relation Age of Onset  . Diabetes Maternal Grandfather   . Diabetes Paternal Grandfather   . Cancer Cousin   . Brain cancer Cousin     Review of Systems  Constitutional: Negative.   HENT: Negative.   Eyes: Negative.   Respiratory: Negative.   Cardiovascular: Negative.   Gastrointestinal: Positive for abdominal pain and diarrhea. Negative for blood in stool, constipation, heartburn, melena, nausea and vomiting.  Genitourinary: Positive for frequency. Negative for dysuria, flank pain, hematuria and urgency.  Musculoskeletal: Negative.   Skin: Positive for rash (vulvar). Negative for itching.  Neurological: Negative.   Endo/Heme/Allergies: Negative.   Psychiatric/Behavioral: Negative for suicidal ideas.    Physical Exam BP 116/80 (BP Location: Left Arm, Patient Position: Sitting, Cuff Size: Large)   Pulse 95   Ht 5\' 4"  (1.626 m)   Wt 270 lb (122.5 kg)   BMI 46.35 kg/m    Physical Exam  Constitutional: She is oriented to person, place, and time. She appears well-developed and well-nourished. No distress.  Genitourinary: Vagina normal. Pelvic exam was performed with patient supine.  There is lesion on the right labia. There is no rash or tenderness on the right labia. There is no rash or tenderness on the left labia.    Vagina exhibits no lesion. No tenderness or bleeding in the vagina. No signs of injury around the vagina.  Genitourinary Comments: Bimanual limited by body habitus Cervix and uterus surgically absent  HENT:  Head: Normocephalic and atraumatic.  Eyes: EOM are normal. No scleral icterus.  Neck: Normal range of motion. Neck supple. No thyromegaly present.  Cardiovascular: Normal rate and regular rhythm.  Exam reveals no gallop and no friction rub.   No murmur heard. Pulmonary/Chest: Effort normal and breath sounds normal. No respiratory distress. She has no wheezes. She has no  rales.  Abdominal: Soft. Bowel sounds are normal. She exhibits no distension and no mass. There is no tenderness. There is no rebound and no guarding.  Musculoskeletal: Normal range of motion. She exhibits no edema.  Lymphadenopathy:    She has no cervical adenopathy.  Neurological: She is alert and oriented to person, place, and time. No cranial nerve deficit.  Skin: Skin is warm and dry. No erythema.  Psychiatric: She has a normal mood and affect. Her behavior is normal. Judgment normal.    Female chaperone present for pelvic and breast  portions of the physical exam  Results: AUDIT Questionnaire (screen for alcoholism): 1 PHQ-9: n/a due to psych history   Assessment: 36 y.o. [phone] female here for routine annual gynecologic examination  Plan: Problem List Items Addressed This  Visit    Endometriosis determined by laparoscopy (Chronic)   Relevant Orders   Ambulatory referral to Obstetrics / Gynecology    Other Visit Diagnoses    Women's annual routine gynecological examination    -  Primary   Pap smear for cervical cancer screening       Relevant Orders   IGP,CtNg,AptimaHPV,rfx16/18,45   Screen for STD (sexually transmitted disease)       Screening for depression       Screening for alcohol problem       VIN I (vulvar intraepithelial neoplasia I)       Relevant Orders   IGP,CtNg,AptimaHPV,rfx16/18,45     Screening: -- Blood pressure screen normal -- Weight screening: obese: discussed management options, including lifestyle, dietary, and exercise. -- Depression screening negative (PHQ-9) -- Nutrition: normal -- cholesterol screening: per PCP -- osteoporosis screening: not due -- tobacco screening: not using -- alcohol screening: AUDIT questionnaire indicates low-risk usage. -- family history of breast cancer screening: done. not at high risk. -- no evidence of domestic violence or intimate partner violence. -- STD screening: gonorrhea/chlamydia NAAT collected -- pap  smear collected per ASCCP guidelines -- HPV vaccination series: not eligilbe   Endometriosis: refer to [facility] for endometriosis ([place] [facility]).   VIN: repeat pap and HPV.  If HPV positive, consider excision of vulvar lesion.  Prentice Docker, MD 01/01/2017 11:00 AM

## 2017-01-02 DIAGNOSIS — G4733 Obstructive sleep apnea (adult) (pediatric): Secondary | ICD-10-CM | POA: Diagnosis not present

## 2017-01-07 ENCOUNTER — Encounter: Payer: Self-pay | Admitting: Obstetrics and Gynecology

## 2017-01-07 LAB — IGP,CTNG,APTIMAHPV,RFX16/18,45
CHLAMYDIA, NUC. ACID AMP: NEGATIVE
Gonococcus by Nucleic Acid Amp: NEGATIVE
HPV APTIMA: NEGATIVE
PAP Smear Comment: 0

## 2017-01-08 ENCOUNTER — Emergency Department
Admission: EM | Admit: 2017-01-08 | Discharge: 2017-01-08 | Disposition: A | Discharge status: Home / Self Care | Payer: PPO | Attending: Emergency Medicine | Admitting: Emergency Medicine

## 2017-01-08 ENCOUNTER — Encounter: Payer: Self-pay | Admitting: *Deleted

## 2017-01-08 DIAGNOSIS — A09 Infectious gastroenteritis and colitis, unspecified: Secondary | ICD-10-CM | POA: Insufficient documentation

## 2017-01-08 DIAGNOSIS — K529 Noninfective gastroenteritis and colitis, unspecified: Secondary | ICD-10-CM | POA: Diagnosis not present

## 2017-01-08 DIAGNOSIS — J45909 Unspecified asthma, uncomplicated: Secondary | ICD-10-CM | POA: Diagnosis not present

## 2017-01-08 DIAGNOSIS — Z79899 Other long term (current) drug therapy: Secondary | ICD-10-CM | POA: Insufficient documentation

## 2017-01-08 DIAGNOSIS — E119 Type 2 diabetes mellitus without complications: Secondary | ICD-10-CM | POA: Diagnosis not present

## 2017-01-08 DIAGNOSIS — R109 Unspecified abdominal pain: Secondary | ICD-10-CM | POA: Diagnosis present

## 2017-01-08 LAB — CBC
HEMATOCRIT: 37.3 % (ref 35.0–47.0)
HEMOGLOBIN: 12.7 g/dL (ref 12.0–16.0)
MCH: 28.9 pg (ref 26.0–34.0)
MCHC: 34.1 g/dL (ref 32.0–36.0)
MCV: 84.9 fL (ref 80.0–100.0)
Platelets: 352 10*3/uL (ref 150–440)
RBC: 4.4 MIL/uL (ref 3.80–5.20)
RDW: 14 % (ref 11.5–14.5)
WBC: 14 10*3/uL — ABNORMAL HIGH (ref 3.6–11.0)

## 2017-01-08 LAB — URINALYSIS, COMPLETE (UACMP) WITH MICROSCOPIC
BILIRUBIN URINE: NEGATIVE
Bacteria, UA: NONE SEEN
Glucose, UA: NEGATIVE mg/dL
HGB URINE DIPSTICK: NEGATIVE
Ketones, ur: NEGATIVE mg/dL
Leukocytes, UA: NEGATIVE
NITRITE: NEGATIVE
PROTEIN: NEGATIVE mg/dL
SPECIFIC GRAVITY, URINE: 1.014 (ref 1.005–1.030)
pH: 5 (ref 5.0–8.0)

## 2017-01-08 LAB — LIPASE, BLOOD: LIPASE: 21 U/L (ref 11–51)

## 2017-01-08 LAB — COMPREHENSIVE METABOLIC PANEL
ALBUMIN: 4 g/dL (ref 3.5–5.0)
ALT: 29 U/L (ref 14–54)
ANION GAP: 7 (ref 5–15)
AST: 31 U/L (ref 15–41)
Alkaline Phosphatase: 68 U/L (ref 38–126)
BILIRUBIN TOTAL: 0.4 mg/dL (ref 0.3–1.2)
BUN: 6 mg/dL (ref 6–20)
CHLORIDE: 110 mmol/L (ref 101–111)
CO2: 23 mmol/L (ref 22–32)
Calcium: 9.2 mg/dL (ref 8.9–10.3)
Creatinine, Ser: 0.79 mg/dL (ref 0.44–1.00)
GFR calc Af Amer: >60 mL/min (ref >60)
GFR calc non Af Amer: >60 mL/min (ref >60)
GLUCOSE: 117 mg/dL — AB (ref 65–99)
POTASSIUM: 3.6 mmol/L (ref 3.5–5.1)
SODIUM: 140 mmol/L (ref 135–145)
TOTAL PROTEIN: 7.4 g/dL (ref 6.5–8.1)

## 2017-01-08 MED ORDER — CIPROFLOXACIN HCL 500 MG PO TABS
750.0000 mg | ORAL_TABLET | Freq: Once | ORAL | Status: AC
  Administered 2017-01-08: 750 mg via ORAL
  Filled 2017-01-08: qty 2

## 2017-01-08 NOTE — Discharge Instructions (Signed)
Please make an appointment to follow-up with a gastroenterologist in the next week for reevaluation. Return to the emergency department sooner for any concerns.  It was a pleasure to take care of you today, and thank you for coming to our emergency department.  If you have any questions or concerns before leaving please ask the nurse to grab me and I'm more than happy to go through your aftercare instructions again.  If you were prescribed any opioid pain medication today such as Norco, Vicodin, Percocet, morphine, hydrocodone, or oxycodone please make sure you do not drive when you are taking this medication as it can alter your ability to drive safely.  If you have any concerns once you are home that you are not improving or are in fact getting worse before you can make it to your follow-up appointment, please do not hesitate to call 911 and come back for further evaluation.  Darel Hong, MD  Results for orders placed or performed during the hospital encounter of 01/08/17  Lipase, blood  Result Value Ref Range   Lipase 21 11 - 51 U/L  Comprehensive metabolic panel  Result Value Ref Range   Sodium 140 135 - 145 mmol/L   Potassium 3.6 3.5 - 5.1 mmol/L   Chloride 110 101 - 111 mmol/L   CO2 23 22 - 32 mmol/L   Glucose, Bld 117 (H) 65 - 99 mg/dL   BUN 6 6 - 20 mg/dL   Creatinine, Ser 0.79 0.44 - 1.00 mg/dL   Calcium 9.2 8.9 - 10.3 mg/dL   Total Protein 7.4 6.5 - 8.1 g/dL   Albumin 4.0 3.5 - 5.0 g/dL   AST 31 15 - 41 U/L   ALT 29 14 - 54 U/L   Alkaline Phosphatase 68 38 - 126 U/L   Total Bilirubin 0.4 0.3 - 1.2 mg/dL   GFR calc non Af Amer >60 >60 mL/min   GFR calc Af Amer >60 >60 mL/min   Anion gap 7 5 - 15  CBC  Result Value Ref Range   WBC 14.0 (H) 3.6 - 11.0 K/uL   RBC 4.40 3.80 - 5.20 MIL/uL   Hemoglobin 12.7 12.0 - 16.0 g/dL   HCT 37.3 35.0 - 47.0 %   MCV 84.9 80.0 - 100.0 fL   MCH 28.9 26.0 - 34.0 pg   MCHC 34.1 32.0 - 36.0 g/dL   RDW 14.0 11.5 - 14.5 %   Platelets  352 150 - 440 K/uL  Urinalysis, Complete w Microscopic  Result Value Ref Range   Color, Urine YELLOW (A) YELLOW   APPearance CLEAR (A) CLEAR   Specific Gravity, Urine 1.014 1.005 - 1.030   pH 5.0 5.0 - 8.0   Glucose, UA NEGATIVE NEGATIVE mg/dL   Hgb urine dipstick NEGATIVE NEGATIVE   Bilirubin Urine NEGATIVE NEGATIVE   Ketones, ur NEGATIVE NEGATIVE mg/dL   Protein, ur NEGATIVE NEGATIVE mg/dL   Nitrite NEGATIVE NEGATIVE   Leukocytes, UA NEGATIVE NEGATIVE   RBC / HPF 0-5 0 - 5 RBC/hpf   WBC, UA 0-5 0 - 5 WBC/hpf   Bacteria, UA NONE SEEN NONE SEEN   Squamous Epithelial / LPF 0-5 (A) NONE SEEN   Mucus PRESENT

## 2017-01-08 NOTE — ED Triage Notes (Signed)
Pt to ED reporting diarrhea for the past 2 months that worsened yesterday. Pt reports she was seen by PCP and was given bentyl and zofran. Pt reports they did not help with diarrhea and abd cramping. Pt reports feeling nauseous and weak. PT reports having slept all day yesterday. Normal PO intake reported and no vomiting or fevers reported . Pt has hx of endometriosis.

## 2017-01-08 NOTE — ED Notes (Signed)
Pt reports diarrhea for several days after taking bentyl.  Pt states not feeling well for past 2 days.  No vomiting.  Pt reports nausea and decreased appetite.  Pt alert.  Speech clear.  Pt in hallway bed.

## 2017-01-08 NOTE — ED Provider Notes (Signed)
[facility] Emergency Department Provider Note  ____________________________________________   First MD Initiated Contact with Patient 01/08/17 1814     (approximate)  I have reviewed the triage vital signs and the nursing notes.   HISTORY  Chief Complaint Abdominal Pain    HPI Susan Tanner is a 36 y.o. female who comes to the emergency department with diarrhea. She says that for the past 3 months or so she's had 3-4 loose watery stools a day. It acutely became worse last night and today and she had 12 loose watery stools this morning. She has had none in the past several hours. She's had no fevers or chills.she's had no recent antibiotics. She has not left the country recently. She does not drink stream water. She has moderate severity cramping lower abdominal pain worse when defecating improved and not defecating. She has a remote colonoscopy showing inflammatory bowel disease, however she has not followed up with her gastroenterologist in some time. Her gastroenterologist reportedly has retired.   Past Medical History:  Diagnosis Date  . Anxiety   . Asthma   . Bipolar 1 disorder ([place])   . Depression   . Diabetes mellitus without complication ([place])   . Endometriosis   . Heart abnormality     Patient Active Problem List   Diagnosis Date Noted  . Endometriosis determined by laparoscopy 01/01/2017    Past Surgical History:  Procedure Laterality Date  . ABDOMINAL HYSTERECTOMY    . APPENDECTOMY    . TUBAL LIGATION      Prior to Admission medications   Medication Sig Start Date End Date Taking? Authorizing Provider  albuterol (PROVENTIL HFA;VENTOLIN HFA) 108 (90 Base) MCG/ACT inhaler Inhale 2 puffs into the lungs every 6 (six) hours as needed for wheezing or shortness of breath. Patient can only tolerate ventolin inhaler 02/17/16   Frederich Cha, MD  ALPRAZolam Duanne Moron) 1 MG tablet Take 1 mg by mouth at bedtime as needed for anxiety.    [provider]  carvedilol (COREG) 3.125 MG tablet Take 3.125 mg by mouth 2 (two) times daily with a meal.    [provider]  dicyclomine (BENTYL) 20 MG tablet Take 20 mg by mouth every 6 (six) hours.    [provider]  hydrOXYzine (ATARAX/VISTARIL) 50 MG tablet Take 50 mg by mouth Nightly.     [provider]  lithium carbonate (ESKALITH) 450 MG CR tablet Take by mouth 2 (two) times daily. pt takes 1 in the morning and 2 at night    [provider]  pantoprazole (PROTONIX) 40 MG tablet Take 40 mg by mouth daily.    [provider]  QUEtiapine (SEROQUEL) 400 MG tablet Take 400 mg by mouth at bedtime.    [provider]  topiramate (TOPAMAX) 25 MG tablet Take 50 mg by mouth 2 (two) times daily.     [provider]    Allergies Ceftin [cefuroxime axetil]; Claritin-d 12 hour [loratadine-pseudoephedrine er]; Geodon [ziprasidone hcl]; Percocet [oxycodone-acetaminophen]; Lamictal [lamotrigine]; and Tape  Family History  Problem Relation Age of Onset  . Diabetes Maternal Grandfather   . Diabetes Paternal Grandfather   . Cancer Cousin   . Brain cancer Cousin     Social History Social History  Substance Use Topics  . Smoking status: Never Smoker  . Smokeless tobacco: Never Used  . Alcohol use No    Review of Systems Constitutional: No fever/chills Eyes: No visual changes. ENT: No sore throat. Cardiovascular: Denies  chest pain. Respiratory: Denies shortness of breath. Gastrointestinal: positive abdominal pain.  No nausea, no vomiting.  Positive diarrhea.  No constipation. Genitourinary: Negative for dysuria. Musculoskeletal: Negative for back pain. Skin: Negative for rash. Neurological: Negative for headaches, focal weakness or numbness.   ____________________________________________   PHYSICAL EXAM:  VITAL SIGNS: ED Triage Vitals  Enc Vitals Group     BP 01/08/17 1421 116/76     Pulse Rate 01/08/17 1421 83      Resp 01/08/17 1421 16     Temp 01/08/17 1421 99.1 F (37.3 C)     Temp Source 01/08/17 1421 Oral     SpO2 01/08/17 1421 95 %     Weight 01/08/17 1421 270 lb (122.5 kg)     Height 01/08/17 1421 5\' 4"  (1.626 m)     Head Circumference --      Peak Flow --      Pain Score 01/08/17 1420 2     Pain Loc --      Pain Edu? --      Excl. in [place]? --     Constitutional: alert and oriented 4 well appearing nontoxic no diaphoresis speaks in full clear sentences Eyes: PERRL EOMI. Head: Atraumatic. Nose: No congestion/rhinnorhea. Mouth/Throat: No trismus Neck: No stridor.   Cardiovascular: Normal rate, regular rhythm. Grossly normal heart sounds.  Good peripheral circulation. Respiratory: Normal respiratory effort.  No retractions. Lungs CTAB and moving good air Gastrointestinal: soft mild diffuse tenderness with no focality no rebound or guarding no peritonitis no McBurney's tenderness negative Rovsing's Musculoskeletal: No lower extremity edema   Neurologic:  Normal speech and language. No gross focal neurologic deficits are appreciated. Skin:  Skin is warm, dry and intact. No rash noted. Psychiatric: Mood and affect are normal. Speech and behavior are normal.    ____________________________________________   DIFFERENTIAL includes but not limited to  infectious diarrhea, inflammatory diarrhea ____________________________________________   LABS (all labs ordered are listed, but only abnormal results are displayed)  Labs Reviewed  COMPREHENSIVE METABOLIC PANEL - Abnormal; Notable for the following:       Result Value   Glucose, Bld 117 (*)    All other components within normal limits  CBC - Abnormal; Notable for the following:    WBC 14.0 (*)    All other components within normal limits  URINALYSIS, COMPLETE (UACMP) WITH MICROSCOPIC - Abnormal; Notable for the following:    Color, Urine YELLOW (*)    APPearance CLEAR (*)    Squamous Epithelial / LPF 0-5 (*)    All other  components within normal limits  LIPASE, BLOOD     blood work interpreted by me shows slightly elevated white count which is nonspecific and could be secondary to stress. No evidence of urinary tract infection __________________________________________  EKG   ____________________________________________  RADIOLOGY   ____________________________________________   PROCEDURES  Procedure(s) performed: no  Procedures  Critical Care performed: no  Observation: no ____________________________________________   INITIAL IMPRESSION / ASSESSMENT AND PLAN / ED COURSE  Pertinent labs & imaging results that were available during my care of the patient were reviewed by me and considered in my medical decision making (see chart for details).  the patient arrives hemodynamically stable and very well-appearing. Her abdominal exam is benign. She seems to have 2 processes ongoing on his chronic diarrhea but she clearly has an acute flare now. I tried to send stool studies however the patient felt no urge to defecate. I think it is reasonable to give a  single dose of 750 mg of ciprofloxacin in case there is some infectious component however she will require follow-up with gastroenterology. Strict return precautions have been given, and I had a lengthy discussion with the patient regarding her workup and she agrees the plan. She is discharged home in improved condition.      ____________________________________________   FINAL CLINICAL IMPRESSION(S) / ED DIAGNOSES  Final diagnoses:  Chronic diarrhea  Infectious diarrhea      NEW MEDICATIONS STARTED DURING THIS VISIT:  Discharge Medication List as of 01/08/2017  6:32 PM       Note:  This document was prepared using Dragon voice recognition software and may include unintentional dictation errors.     Darel Hong, MD 01/08/17 [phone]

## 2017-01-09 DIAGNOSIS — F259 Schizoaffective disorder, unspecified: Secondary | ICD-10-CM | POA: Diagnosis not present

## 2017-01-15 DIAGNOSIS — N809 Endometriosis, unspecified: Secondary | ICD-10-CM | POA: Diagnosis not present

## 2017-02-01 DIAGNOSIS — G4733 Obstructive sleep apnea (adult) (pediatric): Secondary | ICD-10-CM | POA: Diagnosis not present

## 2017-02-13 DIAGNOSIS — F259 Schizoaffective disorder, unspecified: Secondary | ICD-10-CM | POA: Diagnosis not present

## 2017-02-14 DIAGNOSIS — Z5181 Encounter for therapeutic drug level monitoring: Secondary | ICD-10-CM | POA: Diagnosis not present

## 2017-02-14 DIAGNOSIS — F4312 Post-traumatic stress disorder, chronic: Secondary | ICD-10-CM | POA: Diagnosis not present

## 2017-02-14 DIAGNOSIS — F259 Schizoaffective disorder, unspecified: Secondary | ICD-10-CM | POA: Diagnosis not present

## 2017-02-26 DIAGNOSIS — G4733 Obstructive sleep apnea (adult) (pediatric): Secondary | ICD-10-CM | POA: Diagnosis not present

## 2017-02-27 DIAGNOSIS — F5105 Insomnia due to other mental disorder: Secondary | ICD-10-CM | POA: Diagnosis not present

## 2017-02-27 DIAGNOSIS — F250 Schizoaffective disorder, bipolar type: Secondary | ICD-10-CM | POA: Diagnosis not present

## 2017-02-27 DIAGNOSIS — Z79899 Other long term (current) drug therapy: Secondary | ICD-10-CM | POA: Diagnosis not present

## 2017-02-27 DIAGNOSIS — F410 Panic disorder [episodic paroxysmal anxiety] without agoraphobia: Secondary | ICD-10-CM | POA: Diagnosis not present

## 2017-03-04 DIAGNOSIS — G4733 Obstructive sleep apnea (adult) (pediatric): Secondary | ICD-10-CM | POA: Diagnosis not present

## 2017-03-07 ENCOUNTER — Other Ambulatory Visit
Admission: RE | Admit: 2017-03-07 | Discharge: 2017-03-07 | Disposition: A | Discharge status: Home / Self Care | Payer: PPO | Source: Ambulatory Visit | Attending: Psychiatry | Admitting: Psychiatry

## 2017-03-07 DIAGNOSIS — Z79899 Other long term (current) drug therapy: Secondary | ICD-10-CM | POA: Diagnosis not present

## 2017-03-07 DIAGNOSIS — F410 Panic disorder [episodic paroxysmal anxiety] without agoraphobia: Secondary | ICD-10-CM | POA: Diagnosis not present

## 2017-03-07 DIAGNOSIS — F250 Schizoaffective disorder, bipolar type: Secondary | ICD-10-CM | POA: Diagnosis not present

## 2017-03-07 LAB — COMPREHENSIVE METABOLIC PANEL
ALK PHOS: 72 U/L (ref 38–126)
ALT: 31 U/L (ref 14–54)
AST: 43 U/L — AB (ref 15–41)
Albumin: 3.4 g/dL — ABNORMAL LOW (ref 3.5–5.0)
Anion gap: 10 (ref 5–15)
BUN: 10 mg/dL (ref 6–20)
CALCIUM: 8.6 mg/dL — AB (ref 8.9–10.3)
CHLORIDE: 106 mmol/L (ref 101–111)
CO2: 22 mmol/L (ref 22–32)
CREATININE: 0.74 mg/dL (ref 0.44–1.00)
Glucose, Bld: 156 mg/dL — ABNORMAL HIGH (ref 65–99)
Potassium: 3.5 mmol/L (ref 3.5–5.1)
Sodium: 138 mmol/L (ref 135–145)
Total Bilirubin: 0.6 mg/dL (ref 0.3–1.2)
Total Protein: 7 g/dL (ref 6.5–8.1)

## 2017-03-07 LAB — HEMOGLOBIN A1C
HEMOGLOBIN A1C: 6.4 % — AB (ref 4.8–5.6)
MEAN PLASMA GLUCOSE: 136.98 mg/dL

## 2017-03-07 LAB — CBC WITH DIFFERENTIAL/PLATELET
BASOS ABS: 0.1 10*3/uL (ref 0–0.1)
Basophils Relative: 1 %
Eosinophils Absolute: 0.2 10*3/uL (ref 0–0.7)
Eosinophils Relative: 3 %
HCT: 38.3 % (ref 35.0–47.0)
HEMOGLOBIN: 12.6 g/dL (ref 12.0–16.0)
LYMPHS ABS: 1.9 10*3/uL (ref 1.0–3.6)
LYMPHS PCT: 26 %
MCH: 28 pg (ref 26.0–34.0)
MCHC: 32.9 g/dL (ref 32.0–36.0)
MCV: 85 fL (ref 80.0–100.0)
Monocytes Absolute: 0.5 10*3/uL (ref 0.2–0.9)
Monocytes Relative: 7 %
NEUTROS ABS: 4.6 10*3/uL (ref 1.4–6.5)
NEUTROS PCT: 63 %
PLATELETS: 270 10*3/uL (ref 150–440)
RBC: 4.51 MIL/uL (ref 3.80–5.20)
RDW: 13.6 % (ref 11.5–14.5)
WBC: 7.3 10*3/uL (ref 3.6–11.0)

## 2017-03-07 LAB — VITAMIN B12: Vitamin B-12: 318 pg/mL (ref 180–914)

## 2017-03-07 LAB — LIPID PANEL
CHOL/HDL RATIO: 6.1 ratio
Cholesterol: 202 mg/dL — ABNORMAL HIGH (ref 0–200)
HDL: 33 mg/dL — AB (ref >40)
LDL CALC: UNDETERMINED mg/dL (ref 0–99)
Triglycerides: 480 mg/dL — ABNORMAL HIGH (ref <150)
VLDL: UNDETERMINED mg/dL (ref 0–40)

## 2017-03-07 LAB — TSH: TSH: 0.847 u[IU]/mL (ref 0.350–4.500)

## 2017-03-07 LAB — VALPROIC ACID LEVEL: VALPROIC ACID LVL: 66 ug/mL (ref 50.0–100.0)

## 2017-03-09 DIAGNOSIS — F250 Schizoaffective disorder, bipolar type: Secondary | ICD-10-CM | POA: Diagnosis not present

## 2017-03-09 DIAGNOSIS — F410 Panic disorder [episodic paroxysmal anxiety] without agoraphobia: Secondary | ICD-10-CM | POA: Diagnosis not present

## 2017-03-09 DIAGNOSIS — F5105 Insomnia due to other mental disorder: Secondary | ICD-10-CM | POA: Diagnosis not present

## 2017-03-09 DIAGNOSIS — Z79899 Other long term (current) drug therapy: Secondary | ICD-10-CM | POA: Diagnosis not present

## 2017-03-10 LAB — VALPROIC ACID LEVEL, FREE: Valproic Acid, Free: 18.4 ug/mL (ref 6.0–22.0)

## 2017-03-20 DIAGNOSIS — F5105 Insomnia due to other mental disorder: Secondary | ICD-10-CM | POA: Diagnosis not present

## 2017-03-20 DIAGNOSIS — F410 Panic disorder [episodic paroxysmal anxiety] without agoraphobia: Secondary | ICD-10-CM | POA: Diagnosis not present

## 2017-03-20 DIAGNOSIS — F250 Schizoaffective disorder, bipolar type: Secondary | ICD-10-CM | POA: Diagnosis not present

## 2017-04-03 DIAGNOSIS — G4733 Obstructive sleep apnea (adult) (pediatric): Secondary | ICD-10-CM | POA: Diagnosis not present

## 2017-04-11 ENCOUNTER — Emergency Department
Admission: EM | Admit: 2017-04-11 | Discharge: 2017-04-11 | Disposition: A | Discharge status: Home / Self Care | Payer: PPO | Attending: Emergency Medicine | Admitting: Emergency Medicine

## 2017-04-11 DIAGNOSIS — X19XXXA Contact with other heat and hot substances, initial encounter: Secondary | ICD-10-CM | POA: Diagnosis not present

## 2017-04-11 DIAGNOSIS — T2122XA Burn of second degree of abdominal wall, initial encounter: Secondary | ICD-10-CM | POA: Insufficient documentation

## 2017-04-11 DIAGNOSIS — R7303 Prediabetes: Secondary | ICD-10-CM | POA: Diagnosis not present

## 2017-04-11 DIAGNOSIS — F319 Bipolar disorder, unspecified: Secondary | ICD-10-CM | POA: Diagnosis not present

## 2017-04-11 DIAGNOSIS — Y9389 Activity, other specified: Secondary | ICD-10-CM | POA: Insufficient documentation

## 2017-04-11 DIAGNOSIS — T310 Burns involving less than 10% of body surface: Secondary | ICD-10-CM | POA: Diagnosis not present

## 2017-04-11 DIAGNOSIS — Z79899 Other long term (current) drug therapy: Secondary | ICD-10-CM | POA: Diagnosis not present

## 2017-04-11 DIAGNOSIS — E119 Type 2 diabetes mellitus without complications: Secondary | ICD-10-CM | POA: Diagnosis not present

## 2017-04-11 DIAGNOSIS — F419 Anxiety disorder, unspecified: Secondary | ICD-10-CM | POA: Diagnosis not present

## 2017-04-11 DIAGNOSIS — Y998 Other external cause status: Secondary | ICD-10-CM | POA: Diagnosis not present

## 2017-04-11 DIAGNOSIS — F250 Schizoaffective disorder, bipolar type: Secondary | ICD-10-CM | POA: Diagnosis not present

## 2017-04-11 DIAGNOSIS — Y929 Unspecified place or not applicable: Secondary | ICD-10-CM | POA: Insufficient documentation

## 2017-04-11 DIAGNOSIS — Z888 Allergy status to other drugs, medicaments and biological substances status: Secondary | ICD-10-CM | POA: Diagnosis not present

## 2017-04-11 DIAGNOSIS — Z885 Allergy status to narcotic agent status: Secondary | ICD-10-CM | POA: Diagnosis not present

## 2017-04-11 DIAGNOSIS — Z79891 Long term (current) use of opiate analgesic: Secondary | ICD-10-CM | POA: Diagnosis not present

## 2017-04-11 DIAGNOSIS — L03311 Cellulitis of abdominal wall: Secondary | ICD-10-CM | POA: Diagnosis not present

## 2017-04-11 DIAGNOSIS — T2102XA Burn of unspecified degree of abdominal wall, initial encounter: Secondary | ICD-10-CM | POA: Diagnosis not present

## 2017-04-11 DIAGNOSIS — F4310 Post-traumatic stress disorder, unspecified: Secondary | ICD-10-CM | POA: Diagnosis not present

## 2017-04-11 DIAGNOSIS — J45909 Unspecified asthma, uncomplicated: Secondary | ICD-10-CM | POA: Insufficient documentation

## 2017-04-11 DIAGNOSIS — G4730 Sleep apnea, unspecified: Secondary | ICD-10-CM | POA: Diagnosis not present

## 2017-04-11 DIAGNOSIS — S3991XA Unspecified injury of abdomen, initial encounter: Secondary | ICD-10-CM | POA: Diagnosis present

## 2017-04-11 DIAGNOSIS — L03319 Cellulitis of trunk, unspecified: Secondary | ICD-10-CM

## 2017-04-11 LAB — CBC WITH DIFFERENTIAL/PLATELET
BASOS ABS: 0.1 10*3/uL (ref 0–0.1)
BASOS PCT: 1 %
EOS ABS: 0.3 10*3/uL (ref 0–0.7)
Eosinophils Relative: 4 %
HEMATOCRIT: 37.9 % (ref 35.0–47.0)
HEMOGLOBIN: 12.6 g/dL (ref 12.0–16.0)
Lymphocytes Relative: 43 %
Lymphs Abs: 3.3 10*3/uL (ref 1.0–3.6)
MCH: 28.2 pg (ref 26.0–34.0)
MCHC: 33.3 g/dL (ref 32.0–36.0)
MCV: 84.6 fL (ref 80.0–100.0)
Monocytes Absolute: 0.6 10*3/uL (ref 0.2–0.9)
Monocytes Relative: 8 %
NEUTROS ABS: 3.4 10*3/uL (ref 1.4–6.5)
NEUTROS PCT: 44 %
Platelets: 304 10*3/uL (ref 150–440)
RBC: 4.49 MIL/uL (ref 3.80–5.20)
RDW: 13.5 % (ref 11.5–14.5)
WBC: 7.7 10*3/uL (ref 3.6–11.0)

## 2017-04-11 LAB — COMPREHENSIVE METABOLIC PANEL
ALBUMIN: 3.5 g/dL (ref 3.5–5.0)
ALK PHOS: 77 U/L (ref 38–126)
ALT: 34 U/L (ref 14–54)
ANION GAP: 10 (ref 5–15)
AST: 51 U/L — AB (ref 15–41)
BILIRUBIN TOTAL: 0.5 mg/dL (ref 0.3–1.2)
BUN: 12 mg/dL (ref 6–20)
CALCIUM: 8.5 mg/dL — AB (ref 8.9–10.3)
CO2: 22 mmol/L (ref 22–32)
Chloride: 106 mmol/L (ref 101–111)
Creatinine, Ser: 0.82 mg/dL (ref 0.44–1.00)
GFR calc Af Amer: >60 mL/min (ref >60)
GFR calc non Af Amer: >60 mL/min (ref >60)
GLUCOSE: 169 mg/dL — AB (ref 65–99)
Potassium: 3.8 mmol/L (ref 3.5–5.1)
SODIUM: 138 mmol/L (ref 135–145)
TOTAL PROTEIN: 7.1 g/dL (ref 6.5–8.1)

## 2017-04-11 MED ORDER — CEPHALEXIN 500 MG PO CAPS: 500.0000 mg | ORAL_CAPSULE | Freq: Four times a day (QID) | ORAL | 0 refills | Status: AC

## 2017-04-11 MED ORDER — SILVER SULFADIAZINE 1 % EX CREA
TOPICAL_CREAM | CUTANEOUS | Status: AC
  Filled 2017-04-11: qty 85

## 2017-04-11 MED ORDER — SILVER SULFADIAZINE 1 % EX CREA
TOPICAL_CREAM | Freq: Once | CUTANEOUS | Status: AC
  Administered 2017-04-11: 04:00:00 via TOPICAL

## 2017-04-11 MED ORDER — SILVER SULFADIAZINE 1 % EX CREA: TOPICAL_CREAM | CUTANEOUS | 1 refills | Status: AC

## 2017-04-11 NOTE — ED Notes (Signed)

## 2017-04-11 NOTE — ED Triage Notes (Signed)
Patient reports boil to lower medial abdomen X 1 week. Patient reports she applied heating pad to boil 3 days ago. Patient reports boil burst and drained yellow drainage 2 days ago. Patient reports continued pain/drainage from site. Area is reddened, raw, and warm to touch.

## 2017-04-11 NOTE — ED Notes (Signed)
Pt stated "I had a boil that I was trying to get to come to a head so I could pop it-and it did pop and drain for 2 days-but I had the heating pad on and lost track of time so these blisters came up the next day on my stretch marks..this one popped and has drained some"
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
## 2017-04-11 NOTE — Discharge Instructions (Signed)
Please apply the provided (and prescribed) ointment twice daily.  Keep the wound(s) clean and dry.  Follow up at the [facility] at the next available opportunity.  Use over-the-counter pain medication as needed and according to label instructions.  Take the prescribed oral antibiotics to make sure you are not developing a mild skin infection (cellulitis) around the site of the burn.  Return to the emergency department if you develop new or worsening symptoms that concern you.

## 2017-04-17 DIAGNOSIS — Z79899 Other long term (current) drug therapy: Secondary | ICD-10-CM | POA: Diagnosis not present

## 2017-04-17 DIAGNOSIS — F250 Schizoaffective disorder, bipolar type: Secondary | ICD-10-CM | POA: Diagnosis not present

## 2017-04-17 DIAGNOSIS — F5105 Insomnia due to other mental disorder: Secondary | ICD-10-CM | POA: Diagnosis not present

## 2017-04-17 DIAGNOSIS — F410 Panic disorder [episodic paroxysmal anxiety] without agoraphobia: Secondary | ICD-10-CM | POA: Diagnosis not present

## 2017-04-22 DIAGNOSIS — G4733 Obstructive sleep apnea (adult) (pediatric): Secondary | ICD-10-CM | POA: Diagnosis not present

## 2017-04-23 DIAGNOSIS — T310 Burns involving less than 10% of body surface: Secondary | ICD-10-CM | POA: Diagnosis not present

## 2017-04-23 DIAGNOSIS — T2122XA Burn of second degree of abdominal wall, initial encounter: Secondary | ICD-10-CM | POA: Diagnosis not present

## 2017-04-23 DIAGNOSIS — T2122XD Burn of second degree of abdominal wall, subsequent encounter: Secondary | ICD-10-CM | POA: Diagnosis not present

## 2017-04-30 DIAGNOSIS — T2122XA Burn of second degree of abdominal wall, initial encounter: Secondary | ICD-10-CM | POA: Diagnosis not present

## 2017-04-30 DIAGNOSIS — T2132XA Burn of third degree of abdominal wall, initial encounter: Secondary | ICD-10-CM | POA: Diagnosis not present

## 2017-04-30 DIAGNOSIS — T310 Burns involving less than 10% of body surface: Secondary | ICD-10-CM | POA: Diagnosis not present

## 2017-05-04 DIAGNOSIS — G4733 Obstructive sleep apnea (adult) (pediatric): Secondary | ICD-10-CM | POA: Diagnosis not present

## 2017-05-07 DIAGNOSIS — L851 Acquired keratosis [keratoderma] palmaris et plantaris: Secondary | ICD-10-CM | POA: Diagnosis not present

## 2017-05-07 DIAGNOSIS — D229 Melanocytic nevi, unspecified: Secondary | ICD-10-CM | POA: Diagnosis not present

## 2017-05-16 DIAGNOSIS — Z6841 Body Mass Index (BMI) 40.0 and over, adult: Secondary | ICD-10-CM | POA: Diagnosis not present

## 2017-05-16 DIAGNOSIS — T2132XD Burn of third degree of abdominal wall, subsequent encounter: Secondary | ICD-10-CM | POA: Diagnosis not present

## 2017-05-16 DIAGNOSIS — T310 Burns involving less than 10% of body surface: Secondary | ICD-10-CM | POA: Diagnosis not present

## 2017-05-16 DIAGNOSIS — T2122XA Burn of second degree of abdominal wall, initial encounter: Secondary | ICD-10-CM | POA: Diagnosis not present

## 2017-05-16 DIAGNOSIS — T2132XA Burn of third degree of abdominal wall, initial encounter: Secondary | ICD-10-CM | POA: Diagnosis not present

## 2017-06-02 DIAGNOSIS — F5105 Insomnia due to other mental disorder: Secondary | ICD-10-CM | POA: Diagnosis not present

## 2017-06-02 DIAGNOSIS — F250 Schizoaffective disorder, bipolar type: Secondary | ICD-10-CM | POA: Diagnosis not present

## 2017-06-02 DIAGNOSIS — F410 Panic disorder [episodic paroxysmal anxiety] without agoraphobia: Secondary | ICD-10-CM | POA: Diagnosis not present

## 2017-06-18 DIAGNOSIS — T2132XA Burn of third degree of abdominal wall, initial encounter: Secondary | ICD-10-CM | POA: Diagnosis not present

## 2017-06-18 DIAGNOSIS — T310 Burns involving less than 10% of body surface: Secondary | ICD-10-CM | POA: Diagnosis not present

## 2017-06-18 DIAGNOSIS — Z6841 Body Mass Index (BMI) 40.0 and over, adult: Secondary | ICD-10-CM | POA: Diagnosis not present

## 2017-06-18 DIAGNOSIS — T2132XD Burn of third degree of abdominal wall, subsequent encounter: Secondary | ICD-10-CM | POA: Diagnosis not present

## 2017-06-22 DIAGNOSIS — J45909 Unspecified asthma, uncomplicated: Secondary | ICD-10-CM | POA: Diagnosis not present

## 2017-06-22 DIAGNOSIS — F319 Bipolar disorder, unspecified: Secondary | ICD-10-CM | POA: Diagnosis not present

## 2017-06-22 DIAGNOSIS — Z7951 Long term (current) use of inhaled steroids: Secondary | ICD-10-CM | POA: Diagnosis not present

## 2017-06-22 DIAGNOSIS — Z888 Allergy status to other drugs, medicaments and biological substances status: Secondary | ICD-10-CM | POA: Diagnosis not present

## 2017-06-22 DIAGNOSIS — F250 Schizoaffective disorder, bipolar type: Secondary | ICD-10-CM | POA: Diagnosis not present

## 2017-06-22 DIAGNOSIS — Z8249 Family history of ischemic heart disease and other diseases of the circulatory system: Secondary | ICD-10-CM | POA: Diagnosis not present

## 2017-06-22 DIAGNOSIS — Z818 Family history of other mental and behavioral disorders: Secondary | ICD-10-CM | POA: Diagnosis not present

## 2017-06-22 DIAGNOSIS — Z801 Family history of malignant neoplasm of trachea, bronchus and lung: Secondary | ICD-10-CM | POA: Diagnosis not present

## 2017-06-22 DIAGNOSIS — Z800 Family history of malignant neoplasm of digestive organs: Secondary | ICD-10-CM | POA: Diagnosis not present

## 2017-06-22 DIAGNOSIS — Z833 Family history of diabetes mellitus: Secondary | ICD-10-CM | POA: Diagnosis not present

## 2017-06-22 DIAGNOSIS — F419 Anxiety disorder, unspecified: Secondary | ICD-10-CM | POA: Diagnosis not present

## 2017-06-22 DIAGNOSIS — Z823 Family history of stroke: Secondary | ICD-10-CM | POA: Diagnosis not present

## 2017-06-22 DIAGNOSIS — Z90710 Acquired absence of both cervix and uterus: Secondary | ICD-10-CM | POA: Diagnosis not present

## 2017-06-22 DIAGNOSIS — E785 Hyperlipidemia, unspecified: Secondary | ICD-10-CM | POA: Diagnosis not present

## 2017-06-22 DIAGNOSIS — K610 Anal abscess: Secondary | ICD-10-CM | POA: Diagnosis not present

## 2017-06-22 DIAGNOSIS — Z79899 Other long term (current) drug therapy: Secondary | ICD-10-CM | POA: Diagnosis not present

## 2017-06-22 DIAGNOSIS — Z8349 Family history of other endocrine, nutritional and metabolic diseases: Secondary | ICD-10-CM | POA: Diagnosis not present

## 2017-06-22 DIAGNOSIS — E119 Type 2 diabetes mellitus without complications: Secondary | ICD-10-CM | POA: Diagnosis not present

## 2017-06-22 DIAGNOSIS — Z885 Allergy status to narcotic agent status: Secondary | ICD-10-CM | POA: Diagnosis not present

## 2017-06-28 DIAGNOSIS — R946 Abnormal results of thyroid function studies: Secondary | ICD-10-CM | POA: Diagnosis not present

## 2017-06-28 DIAGNOSIS — R739 Hyperglycemia, unspecified: Secondary | ICD-10-CM | POA: Diagnosis not present

## 2017-06-28 DIAGNOSIS — R5383 Other fatigue: Secondary | ICD-10-CM | POA: Diagnosis not present

## 2017-06-28 DIAGNOSIS — L0231 Cutaneous abscess of buttock: Secondary | ICD-10-CM | POA: Diagnosis not present

## 2017-06-29 IMAGING — CT CT ABD-PELV W/ CM
2 of 4 series · 17 of 46 positions shown, 19 images · IV contrast (iopamidol)
Comparison: Pelvic ultrasound performed 10/11/2015, and CT of the
abdomen and pelvis performed 08/16/2012

CLINICAL DATA: Acute onset of bilateral pelvic discomfort and
bloating. Initial encounter.

EXAM:
CT ABDOMEN AND PELVIS WITH CONTRAST
TECHNIQUE: Multidetector CT imaging of the abdomen and pelvis was performed
using the standard protocol following bolus administration of
intravenous contrast.
CONTRAST:  100mL [facility]-YZZ IOPAMIDOL ([facility]-YZZ) INJECTION 61%

[Series 2: routine abd pel with · axial · 0.90mm/px · z∈[-797,-367]mm · 14 of 94 slices shown, 16 images]
[im 4/94  soft-tissue]
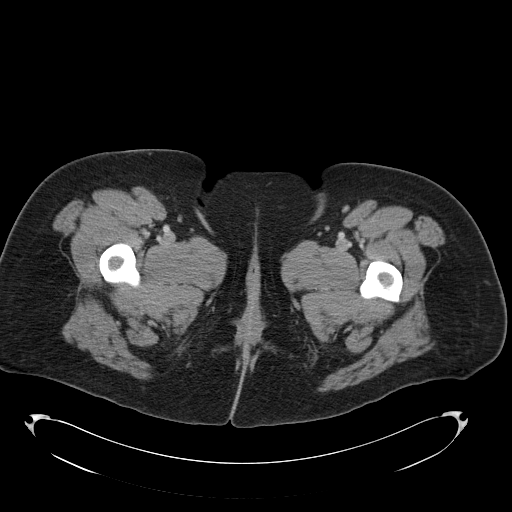
[im 4/94  bone]
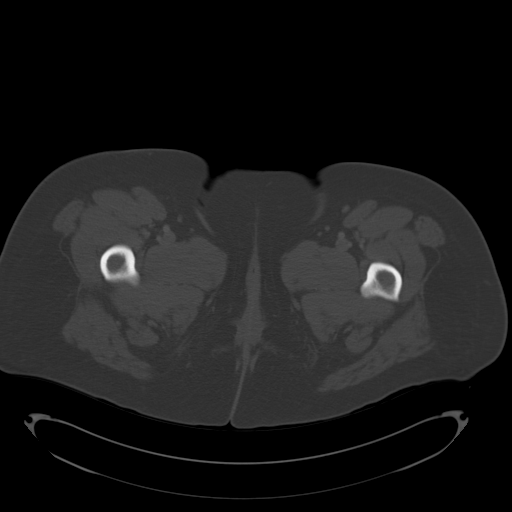
[im 12/94  soft-tissue]
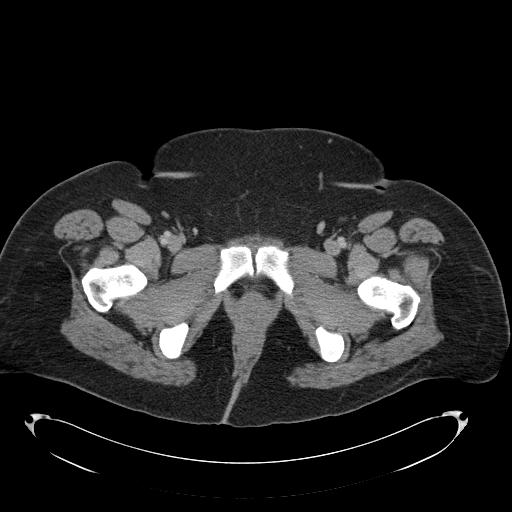
[im 20/94  soft-tissue]
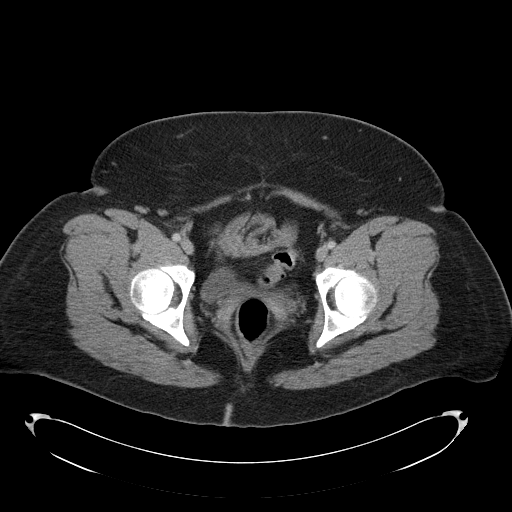
[im 24/94  soft-tissue]
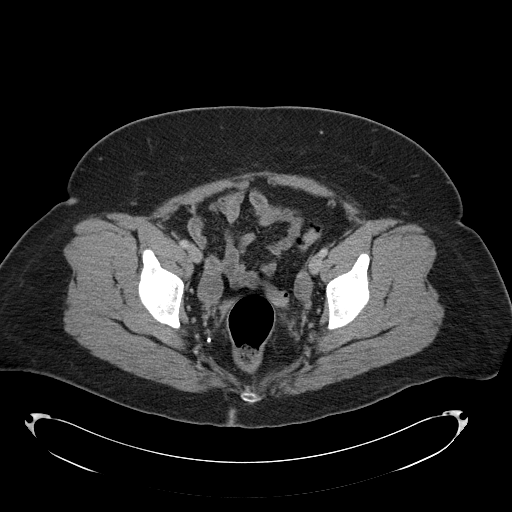
[im 32/94  soft-tissue]
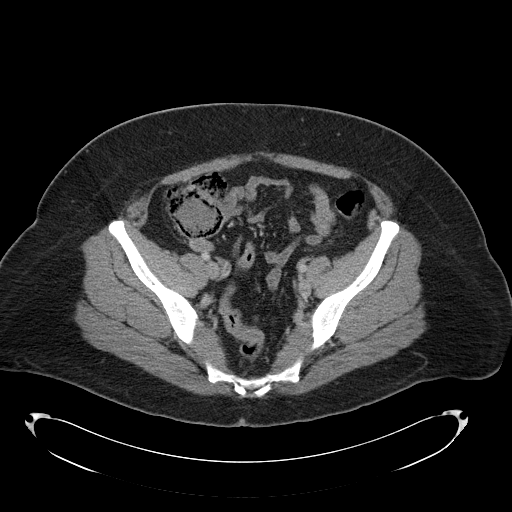
[im 39/94  soft-tissue]
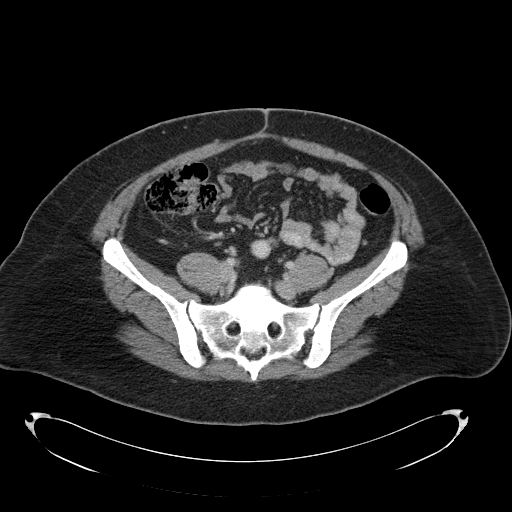
[im 43/94  soft-tissue]
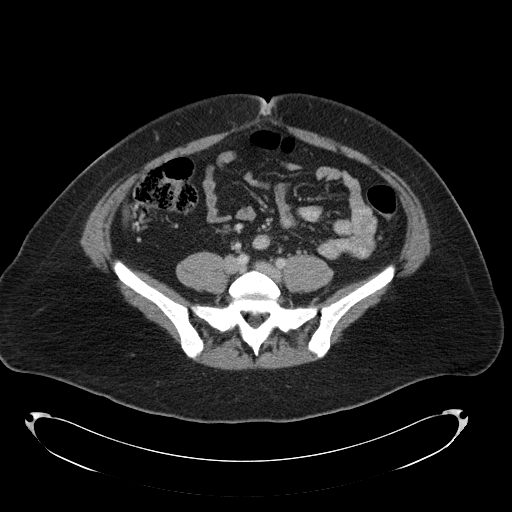
[im 51/94  soft-tissue]
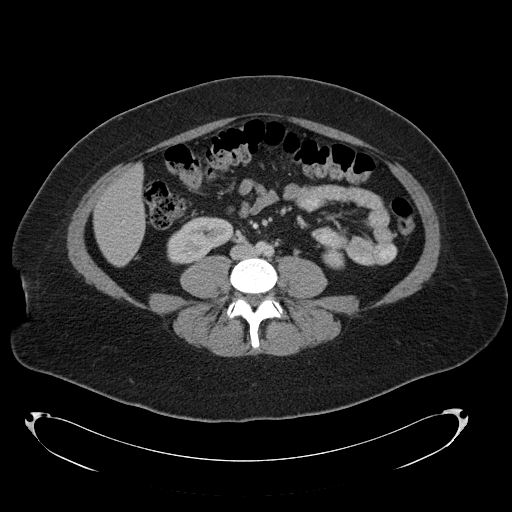
[im 55/94  soft-tissue]
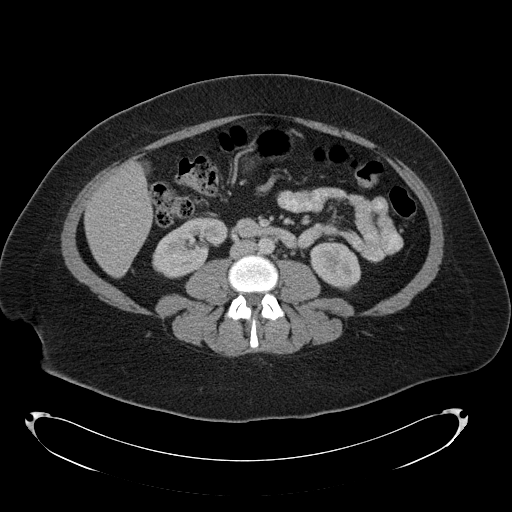
[im 55/94  bone]
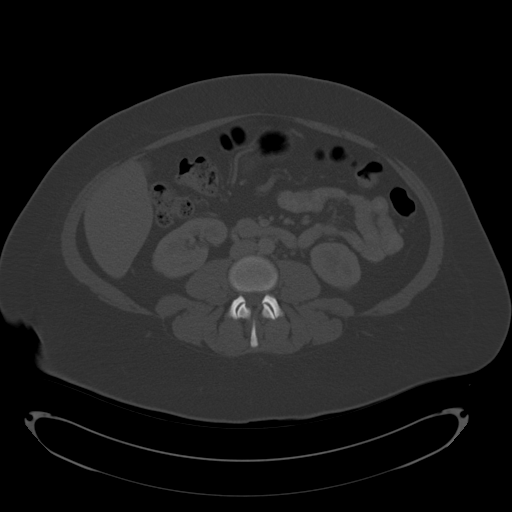
[im 63/94  soft-tissue]
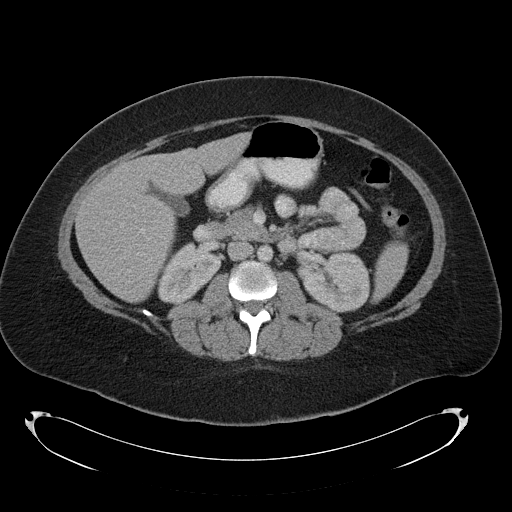
[im 70/94  soft-tissue]
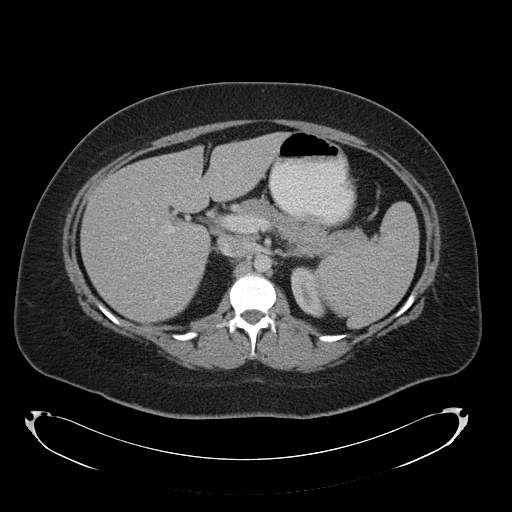
[im 74/94  soft-tissue]
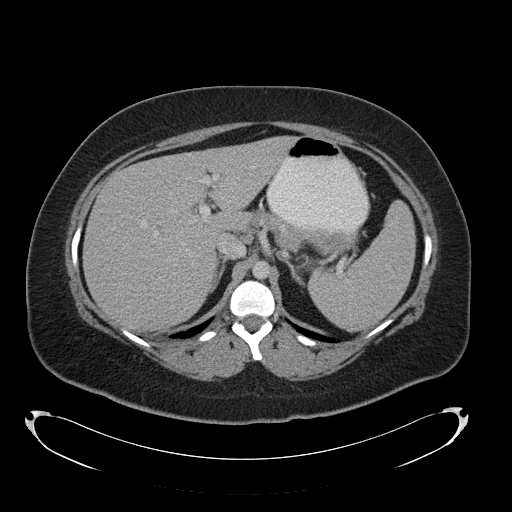
[im 82/94  soft-tissue]
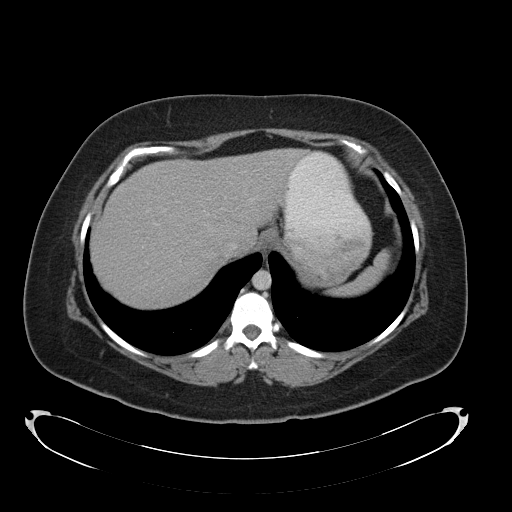
[im 90/94  soft-tissue]
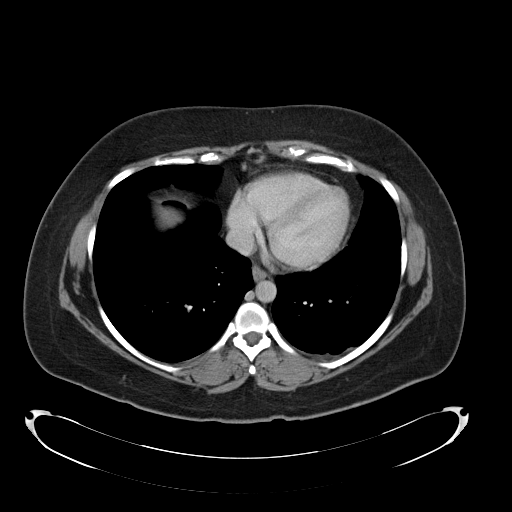

[Series 5: cor routine abd pel with · coronal · 0.96mm/px · 3 of 156 slices shown]
[im 52/156  soft-tissue]
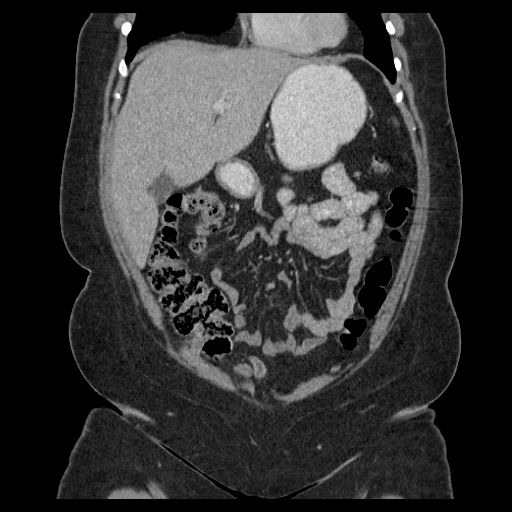
[im 69/156  soft-tissue]
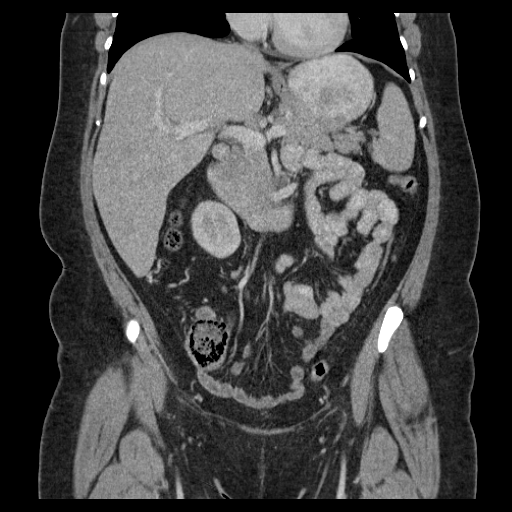
[im 87/156  soft-tissue]
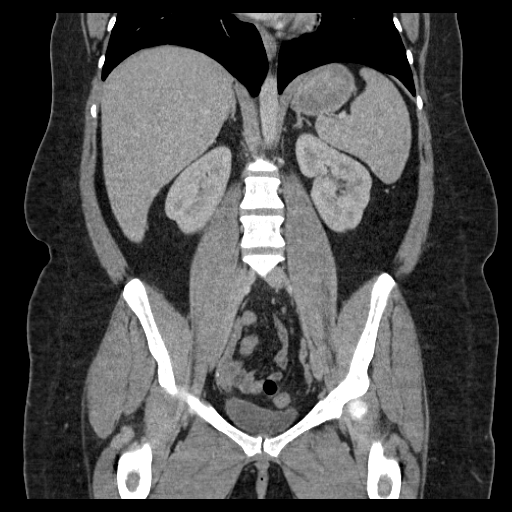

[17 of 46 positions shown; findings below may reference images not displayed]

FINDINGS: The visualized lung bases are clear.

The liver and spleen are unremarkable in appearance. The gallbladder
is within normal limits. The pancreas and adrenal glands are
unremarkable.

The kidneys are unremarkable in appearance. There is no evidence of
hydronephrosis. No renal or ureteral stones are seen. No perinephric
stranding is appreciated.

No free fluid is identified. The small bowel is unremarkable in
appearance. The stomach is within normal limits. No acute vascular
abnormalities are seen.

The patient is status post appendectomy. The colon is unremarkable
in appearance.

The bladder is mildly distended and grossly unremarkable. The
patient is status post hysterectomy. The ovaries are relatively
symmetric. No suspicious adnexal masses are seen. No inguinal
lymphadenopathy is seen.

No acute osseous abnormalities are identified.
IMPRESSION: Unremarkable contrast-enhanced CT of the abdomen and pelvis.

## 2017-07-21 DIAGNOSIS — R072 Precordial pain: Secondary | ICD-10-CM | POA: Diagnosis not present

## 2017-07-21 DIAGNOSIS — R002 Palpitations: Secondary | ICD-10-CM | POA: Diagnosis not present

## 2017-07-21 DIAGNOSIS — I499 Cardiac arrhythmia, unspecified: Secondary | ICD-10-CM | POA: Diagnosis not present

## 2017-07-21 DIAGNOSIS — G4733 Obstructive sleep apnea (adult) (pediatric): Secondary | ICD-10-CM | POA: Diagnosis not present

## 2017-07-21 DIAGNOSIS — I420 Dilated cardiomyopathy: Secondary | ICD-10-CM | POA: Diagnosis not present

## 2017-07-28 DIAGNOSIS — R002 Palpitations: Secondary | ICD-10-CM | POA: Diagnosis not present

## 2017-07-28 DIAGNOSIS — E782 Mixed hyperlipidemia: Secondary | ICD-10-CM | POA: Diagnosis not present

## 2017-07-28 DIAGNOSIS — I493 Ventricular premature depolarization: Secondary | ICD-10-CM | POA: Diagnosis not present

## 2017-07-28 DIAGNOSIS — G4733 Obstructive sleep apnea (adult) (pediatric): Secondary | ICD-10-CM | POA: Diagnosis not present

## 2017-07-28 DIAGNOSIS — I420 Dilated cardiomyopathy: Secondary | ICD-10-CM | POA: Diagnosis not present

## 2017-07-29 DIAGNOSIS — F250 Schizoaffective disorder, bipolar type: Secondary | ICD-10-CM | POA: Diagnosis not present

## 2017-07-29 DIAGNOSIS — F410 Panic disorder [episodic paroxysmal anxiety] without agoraphobia: Secondary | ICD-10-CM | POA: Diagnosis not present

## 2017-07-29 DIAGNOSIS — Z79899 Other long term (current) drug therapy: Secondary | ICD-10-CM | POA: Diagnosis not present

## 2017-07-29 DIAGNOSIS — R002 Palpitations: Secondary | ICD-10-CM | POA: Diagnosis not present

## 2017-07-29 DIAGNOSIS — F5105 Insomnia due to other mental disorder: Secondary | ICD-10-CM | POA: Diagnosis not present

## 2017-08-27 DIAGNOSIS — J45909 Unspecified asthma, uncomplicated: Secondary | ICD-10-CM | POA: Diagnosis not present

## 2017-08-27 DIAGNOSIS — E785 Hyperlipidemia, unspecified: Secondary | ICD-10-CM | POA: Diagnosis not present

## 2017-08-27 DIAGNOSIS — N83201 Unspecified ovarian cyst, right side: Secondary | ICD-10-CM | POA: Diagnosis not present

## 2017-08-27 DIAGNOSIS — F419 Anxiety disorder, unspecified: Secondary | ICD-10-CM | POA: Diagnosis not present

## 2017-08-27 DIAGNOSIS — N83291 Other ovarian cyst, right side: Secondary | ICD-10-CM | POA: Diagnosis not present

## 2017-08-27 DIAGNOSIS — N83202 Unspecified ovarian cyst, left side: Secondary | ICD-10-CM | POA: Diagnosis not present

## 2017-08-27 DIAGNOSIS — E119 Type 2 diabetes mellitus without complications: Secondary | ICD-10-CM | POA: Diagnosis not present

## 2017-08-27 DIAGNOSIS — Z79899 Other long term (current) drug therapy: Secondary | ICD-10-CM | POA: Diagnosis not present

## 2017-08-27 DIAGNOSIS — Z888 Allergy status to other drugs, medicaments and biological substances status: Secondary | ICD-10-CM | POA: Diagnosis not present

## 2017-08-27 DIAGNOSIS — Z885 Allergy status to narcotic agent status: Secondary | ICD-10-CM | POA: Diagnosis not present

## 2017-08-27 DIAGNOSIS — R1031 Right lower quadrant pain: Secondary | ICD-10-CM | POA: Diagnosis not present

## 2017-08-27 DIAGNOSIS — R110 Nausea: Secondary | ICD-10-CM | POA: Diagnosis not present

## 2017-09-12 DIAGNOSIS — F5105 Insomnia due to other mental disorder: Secondary | ICD-10-CM | POA: Diagnosis not present

## 2017-09-12 DIAGNOSIS — F250 Schizoaffective disorder, bipolar type: Secondary | ICD-10-CM | POA: Diagnosis not present

## 2017-09-12 DIAGNOSIS — F410 Panic disorder [episodic paroxysmal anxiety] without agoraphobia: Secondary | ICD-10-CM | POA: Diagnosis not present

## 2017-09-23 DIAGNOSIS — K219 Gastro-esophageal reflux disease without esophagitis: Secondary | ICD-10-CM | POA: Diagnosis not present

## 2017-09-23 DIAGNOSIS — Z90710 Acquired absence of both cervix and uterus: Secondary | ICD-10-CM | POA: Diagnosis not present

## 2017-09-23 DIAGNOSIS — R110 Nausea: Secondary | ICD-10-CM | POA: Diagnosis not present

## 2017-09-23 DIAGNOSIS — F319 Bipolar disorder, unspecified: Secondary | ICD-10-CM | POA: Diagnosis not present

## 2017-09-23 DIAGNOSIS — J45909 Unspecified asthma, uncomplicated: Secondary | ICD-10-CM | POA: Diagnosis not present

## 2017-09-23 DIAGNOSIS — Z888 Allergy status to other drugs, medicaments and biological substances status: Secondary | ICD-10-CM | POA: Diagnosis not present

## 2017-09-23 DIAGNOSIS — F259 Schizoaffective disorder, unspecified: Secondary | ICD-10-CM | POA: Diagnosis not present

## 2017-09-23 DIAGNOSIS — E785 Hyperlipidemia, unspecified: Secondary | ICD-10-CM | POA: Diagnosis not present

## 2017-09-23 DIAGNOSIS — Z9851 Tubal ligation status: Secondary | ICD-10-CM | POA: Diagnosis not present

## 2017-09-23 DIAGNOSIS — E119 Type 2 diabetes mellitus without complications: Secondary | ICD-10-CM | POA: Diagnosis not present

## 2017-09-23 DIAGNOSIS — Z79899 Other long term (current) drug therapy: Secondary | ICD-10-CM | POA: Diagnosis not present

## 2017-09-23 DIAGNOSIS — R1031 Right lower quadrant pain: Secondary | ICD-10-CM | POA: Diagnosis not present

## 2017-09-23 DIAGNOSIS — F4310 Post-traumatic stress disorder, unspecified: Secondary | ICD-10-CM | POA: Diagnosis not present

## 2017-09-23 DIAGNOSIS — F419 Anxiety disorder, unspecified: Secondary | ICD-10-CM | POA: Diagnosis not present

## 2017-09-23 DIAGNOSIS — R102 Pelvic and perineal pain: Secondary | ICD-10-CM | POA: Diagnosis not present

## 2017-09-23 DIAGNOSIS — Z885 Allergy status to narcotic agent status: Secondary | ICD-10-CM | POA: Diagnosis not present

## 2017-09-26 DIAGNOSIS — G4733 Obstructive sleep apnea (adult) (pediatric): Secondary | ICD-10-CM | POA: Diagnosis not present

## 2017-09-30 DIAGNOSIS — F5105 Insomnia due to other mental disorder: Secondary | ICD-10-CM | POA: Diagnosis not present

## 2017-09-30 DIAGNOSIS — F250 Schizoaffective disorder, bipolar type: Secondary | ICD-10-CM | POA: Diagnosis not present

## 2017-09-30 DIAGNOSIS — F410 Panic disorder [episodic paroxysmal anxiety] without agoraphobia: Secondary | ICD-10-CM | POA: Diagnosis not present

## 2017-10-01 DIAGNOSIS — J4520 Mild intermittent asthma, uncomplicated: Secondary | ICD-10-CM | POA: Diagnosis not present

## 2017-10-01 DIAGNOSIS — M722 Plantar fascial fibromatosis: Secondary | ICD-10-CM | POA: Diagnosis not present

## 2017-10-01 DIAGNOSIS — Z7689 Persons encountering health services in other specified circumstances: Secondary | ICD-10-CM | POA: Diagnosis not present

## 2017-10-01 DIAGNOSIS — E119 Type 2 diabetes mellitus without complications: Secondary | ICD-10-CM | POA: Diagnosis not present

## 2017-10-01 DIAGNOSIS — Z23 Encounter for immunization: Secondary | ICD-10-CM | POA: Diagnosis not present

## 2017-10-01 DIAGNOSIS — Z79899 Other long term (current) drug therapy: Secondary | ICD-10-CM | POA: Diagnosis not present

## 2017-10-01 DIAGNOSIS — E782 Mixed hyperlipidemia: Secondary | ICD-10-CM | POA: Diagnosis not present

## 2017-10-01 DIAGNOSIS — L989 Disorder of the skin and subcutaneous tissue, unspecified: Secondary | ICD-10-CM | POA: Diagnosis not present

## 2017-10-01 DIAGNOSIS — K219 Gastro-esophageal reflux disease without esophagitis: Secondary | ICD-10-CM | POA: Diagnosis not present

## 2017-10-02 DIAGNOSIS — J45909 Unspecified asthma, uncomplicated: Secondary | ICD-10-CM | POA: Diagnosis not present

## 2017-10-02 DIAGNOSIS — F250 Schizoaffective disorder, bipolar type: Secondary | ICD-10-CM | POA: Diagnosis not present

## 2017-10-02 DIAGNOSIS — N83201 Unspecified ovarian cyst, right side: Secondary | ICD-10-CM | POA: Diagnosis not present

## 2017-10-02 DIAGNOSIS — Z90710 Acquired absence of both cervix and uterus: Secondary | ICD-10-CM | POA: Diagnosis not present

## 2017-10-02 DIAGNOSIS — N83202 Unspecified ovarian cyst, left side: Secondary | ICD-10-CM | POA: Diagnosis not present

## 2017-10-02 DIAGNOSIS — F419 Anxiety disorder, unspecified: Secondary | ICD-10-CM | POA: Diagnosis not present

## 2017-10-02 DIAGNOSIS — R110 Nausea: Secondary | ICD-10-CM | POA: Diagnosis not present

## 2017-10-02 DIAGNOSIS — Z885 Allergy status to narcotic agent status: Secondary | ICD-10-CM | POA: Diagnosis not present

## 2017-10-02 DIAGNOSIS — F4310 Post-traumatic stress disorder, unspecified: Secondary | ICD-10-CM | POA: Diagnosis not present

## 2017-10-02 DIAGNOSIS — E785 Hyperlipidemia, unspecified: Secondary | ICD-10-CM | POA: Diagnosis not present

## 2017-10-02 DIAGNOSIS — Z9851 Tubal ligation status: Secondary | ICD-10-CM | POA: Diagnosis not present

## 2017-10-02 DIAGNOSIS — Z79899 Other long term (current) drug therapy: Secondary | ICD-10-CM | POA: Diagnosis not present

## 2017-10-02 DIAGNOSIS — E119 Type 2 diabetes mellitus without complications: Secondary | ICD-10-CM | POA: Diagnosis not present

## 2017-10-02 DIAGNOSIS — R102 Pelvic and perineal pain: Secondary | ICD-10-CM | POA: Diagnosis not present

## 2017-10-02 DIAGNOSIS — K219 Gastro-esophageal reflux disease without esophagitis: Secondary | ICD-10-CM | POA: Diagnosis not present

## 2017-10-02 DIAGNOSIS — Z888 Allergy status to other drugs, medicaments and biological substances status: Secondary | ICD-10-CM | POA: Diagnosis not present

## 2017-10-07 DIAGNOSIS — N83202 Unspecified ovarian cyst, left side: Secondary | ICD-10-CM | POA: Diagnosis not present

## 2017-10-07 DIAGNOSIS — R102 Pelvic and perineal pain: Secondary | ICD-10-CM | POA: Diagnosis not present

## 2017-10-07 DIAGNOSIS — N83201 Unspecified ovarian cyst, right side: Secondary | ICD-10-CM | POA: Diagnosis not present

## 2017-10-13 ENCOUNTER — Other Ambulatory Visit
Admission: RE | Admit: 2017-10-13 | Discharge: 2017-10-13 | Disposition: A | Discharge status: Home / Self Care | Payer: PPO | Source: Ambulatory Visit | Attending: Psychiatry | Admitting: Psychiatry

## 2017-10-13 DIAGNOSIS — F250 Schizoaffective disorder, bipolar type: Secondary | ICD-10-CM | POA: Diagnosis not present

## 2017-10-13 DIAGNOSIS — F410 Panic disorder [episodic paroxysmal anxiety] without agoraphobia: Secondary | ICD-10-CM | POA: Diagnosis not present

## 2017-10-13 LAB — LIPID PANEL
CHOL/HDL RATIO: 6 ratio
CHOLESTEROL: 185 mg/dL (ref 0–200)
HDL: 31 mg/dL — ABNORMAL LOW (ref >40)
LDL CALC: 79 mg/dL (ref 0–99)
TRIGLYCERIDES: 377 mg/dL — AB (ref <150)
VLDL: 75 mg/dL — AB (ref 0–40)

## 2017-10-13 LAB — COMPREHENSIVE METABOLIC PANEL
ALT: 27 U/L (ref 14–54)
AST: 25 U/L (ref 15–41)
Albumin: 3.7 g/dL (ref 3.5–5.0)
Alkaline Phosphatase: 71 U/L (ref 38–126)
Anion gap: 10 (ref 5–15)
BUN: 9 mg/dL (ref 6–20)
CHLORIDE: 108 mmol/L (ref 101–111)
CO2: 20 mmol/L — AB (ref 22–32)
Calcium: 8.7 mg/dL — ABNORMAL LOW (ref 8.9–10.3)
Creatinine, Ser: 0.79 mg/dL (ref 0.44–1.00)
Glucose, Bld: 155 mg/dL — ABNORMAL HIGH (ref 65–99)
Potassium: 3.8 mmol/L (ref 3.5–5.1)
SODIUM: 138 mmol/L (ref 135–145)
Total Bilirubin: 0.5 mg/dL (ref 0.3–1.2)
Total Protein: 6.9 g/dL (ref 6.5–8.1)

## 2017-10-13 LAB — CBC WITH DIFFERENTIAL/PLATELET
BASOS ABS: 0.1 10*3/uL (ref 0–0.1)
Basophils Relative: 1 %
EOS ABS: 0.3 10*3/uL (ref 0–0.7)
EOS PCT: 2 %
HCT: 35.2 % (ref 35.0–47.0)
Hemoglobin: 11.9 g/dL — ABNORMAL LOW (ref 12.0–16.0)
LYMPHS PCT: 33 %
Lymphs Abs: 4 10*3/uL — ABNORMAL HIGH (ref 1.0–3.6)
MCH: 28.6 pg (ref 26.0–34.0)
MCHC: 33.7 g/dL (ref 32.0–36.0)
MCV: 84.9 fL (ref 80.0–100.0)
MONO ABS: 0.9 10*3/uL (ref 0.2–0.9)
Monocytes Relative: 7 %
Neutro Abs: 6.8 10*3/uL — ABNORMAL HIGH (ref 1.4–6.5)
Neutrophils Relative %: 57 %
PLATELETS: 377 10*3/uL (ref 150–440)
RBC: 4.15 MIL/uL (ref 3.80–5.20)
RDW: 14 % (ref 11.5–14.5)
WBC: 12 10*3/uL — AB (ref 3.6–11.0)

## 2017-10-13 LAB — LITHIUM LEVEL: Lithium Lvl: 0.55 mmol/L — ABNORMAL LOW (ref 0.60–1.20)

## 2017-10-13 LAB — HEMOGLOBIN A1C
Hgb A1c MFr Bld: 7.1 % — ABNORMAL HIGH (ref 4.8–5.6)
Mean Plasma Glucose: 157.07 mg/dL

## 2017-10-16 DIAGNOSIS — Z79899 Other long term (current) drug therapy: Secondary | ICD-10-CM | POA: Diagnosis not present

## 2017-10-16 DIAGNOSIS — F5105 Insomnia due to other mental disorder: Secondary | ICD-10-CM | POA: Diagnosis not present

## 2017-10-16 DIAGNOSIS — F410 Panic disorder [episodic paroxysmal anxiety] without agoraphobia: Secondary | ICD-10-CM | POA: Diagnosis not present

## 2017-10-16 DIAGNOSIS — F250 Schizoaffective disorder, bipolar type: Secondary | ICD-10-CM | POA: Diagnosis not present

## 2017-11-21 DIAGNOSIS — R102 Pelvic and perineal pain: Secondary | ICD-10-CM | POA: Diagnosis not present

## 2017-11-21 DIAGNOSIS — N644 Mastodynia: Secondary | ICD-10-CM | POA: Diagnosis not present

## 2017-11-21 DIAGNOSIS — L723 Sebaceous cyst: Secondary | ICD-10-CM | POA: Diagnosis not present

## 2017-11-21 DIAGNOSIS — Z01419 Encounter for gynecological examination (general) (routine) without abnormal findings: Secondary | ICD-10-CM | POA: Diagnosis not present

## 2017-11-27 DIAGNOSIS — L98491 Non-pressure chronic ulcer of skin of other sites limited to breakdown of skin: Secondary | ICD-10-CM | POA: Diagnosis not present

## 2017-11-27 DIAGNOSIS — K5900 Constipation, unspecified: Secondary | ICD-10-CM | POA: Diagnosis not present

## 2017-11-30 DIAGNOSIS — R350 Frequency of micturition: Secondary | ICD-10-CM | POA: Diagnosis not present

## 2017-11-30 DIAGNOSIS — Z113 Encounter for screening for infections with a predominantly sexual mode of transmission: Secondary | ICD-10-CM | POA: Diagnosis not present

## 2017-11-30 DIAGNOSIS — F329 Major depressive disorder, single episode, unspecified: Secondary | ICD-10-CM | POA: Diagnosis not present

## 2017-11-30 DIAGNOSIS — F419 Anxiety disorder, unspecified: Secondary | ICD-10-CM | POA: Diagnosis not present

## 2017-11-30 DIAGNOSIS — Z90711 Acquired absence of uterus with remaining cervical stump: Secondary | ICD-10-CM | POA: Diagnosis not present

## 2017-11-30 DIAGNOSIS — F250 Schizoaffective disorder, bipolar type: Secondary | ICD-10-CM | POA: Diagnosis not present

## 2017-11-30 DIAGNOSIS — N390 Urinary tract infection, site not specified: Secondary | ICD-10-CM | POA: Diagnosis not present

## 2017-11-30 DIAGNOSIS — E785 Hyperlipidemia, unspecified: Secondary | ICD-10-CM | POA: Diagnosis not present

## 2017-11-30 DIAGNOSIS — Z885 Allergy status to narcotic agent status: Secondary | ICD-10-CM | POA: Diagnosis not present

## 2017-11-30 DIAGNOSIS — Z79899 Other long term (current) drug therapy: Secondary | ICD-10-CM | POA: Diagnosis not present

## 2017-11-30 DIAGNOSIS — K219 Gastro-esophageal reflux disease without esophagitis: Secondary | ICD-10-CM | POA: Diagnosis not present

## 2017-11-30 DIAGNOSIS — Z888 Allergy status to other drugs, medicaments and biological substances status: Secondary | ICD-10-CM | POA: Diagnosis not present

## 2017-11-30 DIAGNOSIS — N83209 Unspecified ovarian cyst, unspecified side: Secondary | ICD-10-CM | POA: Diagnosis not present

## 2017-11-30 DIAGNOSIS — R102 Pelvic and perineal pain: Secondary | ICD-10-CM | POA: Diagnosis not present

## 2017-11-30 DIAGNOSIS — E119 Type 2 diabetes mellitus without complications: Secondary | ICD-10-CM | POA: Diagnosis not present

## 2017-11-30 DIAGNOSIS — Z9851 Tubal ligation status: Secondary | ICD-10-CM | POA: Diagnosis not present

## 2017-11-30 DIAGNOSIS — F4310 Post-traumatic stress disorder, unspecified: Secondary | ICD-10-CM | POA: Diagnosis not present

## 2017-11-30 DIAGNOSIS — R1030 Lower abdominal pain, unspecified: Secondary | ICD-10-CM | POA: Diagnosis not present

## 2017-11-30 DIAGNOSIS — J45909 Unspecified asthma, uncomplicated: Secondary | ICD-10-CM | POA: Diagnosis not present

## 2017-12-08 DIAGNOSIS — M546 Pain in thoracic spine: Secondary | ICD-10-CM | POA: Diagnosis not present

## 2017-12-08 DIAGNOSIS — N390 Urinary tract infection, site not specified: Secondary | ICD-10-CM | POA: Diagnosis not present

## 2017-12-08 DIAGNOSIS — Z09 Encounter for follow-up examination after completed treatment for conditions other than malignant neoplasm: Secondary | ICD-10-CM | POA: Diagnosis not present

## 2017-12-17 DIAGNOSIS — F410 Panic disorder [episodic paroxysmal anxiety] without agoraphobia: Secondary | ICD-10-CM | POA: Diagnosis not present

## 2017-12-17 DIAGNOSIS — F250 Schizoaffective disorder, bipolar type: Secondary | ICD-10-CM | POA: Diagnosis not present

## 2017-12-17 DIAGNOSIS — F5105 Insomnia due to other mental disorder: Secondary | ICD-10-CM | POA: Diagnosis not present

## 2017-12-24 DIAGNOSIS — N644 Mastodynia: Secondary | ICD-10-CM | POA: Diagnosis not present

## 2018-01-09 DIAGNOSIS — M546 Pain in thoracic spine: Secondary | ICD-10-CM | POA: Diagnosis not present

## 2018-01-09 DIAGNOSIS — M7918 Myalgia, other site: Secondary | ICD-10-CM | POA: Diagnosis not present

## 2018-01-09 DIAGNOSIS — M722 Plantar fascial fibromatosis: Secondary | ICD-10-CM | POA: Diagnosis not present

## 2018-01-11 DIAGNOSIS — F419 Anxiety disorder, unspecified: Secondary | ICD-10-CM | POA: Diagnosis not present

## 2018-01-11 DIAGNOSIS — I493 Ventricular premature depolarization: Secondary | ICD-10-CM | POA: Diagnosis not present

## 2018-01-11 DIAGNOSIS — R202 Paresthesia of skin: Secondary | ICD-10-CM | POA: Diagnosis not present

## 2018-01-11 DIAGNOSIS — E669 Obesity, unspecified: Secondary | ICD-10-CM | POA: Diagnosis not present

## 2018-01-11 DIAGNOSIS — R197 Diarrhea, unspecified: Secondary | ICD-10-CM | POA: Diagnosis not present

## 2018-01-11 DIAGNOSIS — M546 Pain in thoracic spine: Secondary | ICD-10-CM | POA: Diagnosis not present

## 2018-01-11 DIAGNOSIS — E119 Type 2 diabetes mellitus without complications: Secondary | ICD-10-CM | POA: Diagnosis not present

## 2018-01-11 DIAGNOSIS — K219 Gastro-esophageal reflux disease without esophagitis: Secondary | ICD-10-CM | POA: Diagnosis not present

## 2018-01-11 DIAGNOSIS — R110 Nausea: Secondary | ICD-10-CM | POA: Diagnosis not present

## 2018-01-11 DIAGNOSIS — J45909 Unspecified asthma, uncomplicated: Secondary | ICD-10-CM | POA: Diagnosis not present

## 2018-01-11 DIAGNOSIS — M6281 Muscle weakness (generalized): Secondary | ICD-10-CM | POA: Diagnosis not present

## 2018-01-11 DIAGNOSIS — F319 Bipolar disorder, unspecified: Secondary | ICD-10-CM | POA: Diagnosis not present

## 2018-01-11 DIAGNOSIS — M549 Dorsalgia, unspecified: Secondary | ICD-10-CM | POA: Diagnosis not present

## 2018-01-11 DIAGNOSIS — Z79899 Other long term (current) drug therapy: Secondary | ICD-10-CM | POA: Diagnosis not present

## 2018-01-11 DIAGNOSIS — Z885 Allergy status to narcotic agent status: Secondary | ICD-10-CM | POA: Diagnosis not present

## 2018-01-11 DIAGNOSIS — R29898 Other symptoms and signs involving the musculoskeletal system: Secondary | ICD-10-CM | POA: Diagnosis not present

## 2018-01-11 DIAGNOSIS — E785 Hyperlipidemia, unspecified: Secondary | ICD-10-CM | POA: Diagnosis not present

## 2018-01-11 DIAGNOSIS — R200 Anesthesia of skin: Secondary | ICD-10-CM | POA: Diagnosis not present

## 2018-01-11 DIAGNOSIS — Z7984 Long term (current) use of oral hypoglycemic drugs: Secondary | ICD-10-CM | POA: Diagnosis not present

## 2018-01-14 DIAGNOSIS — M722 Plantar fascial fibromatosis: Secondary | ICD-10-CM | POA: Diagnosis not present

## 2018-01-14 DIAGNOSIS — D2372 Other benign neoplasm of skin of left lower limb, including hip: Secondary | ICD-10-CM | POA: Diagnosis not present

## 2018-01-14 DIAGNOSIS — D487 Neoplasm of uncertain behavior of other specified sites: Secondary | ICD-10-CM | POA: Diagnosis not present

## 2018-02-23 DIAGNOSIS — E782 Mixed hyperlipidemia: Secondary | ICD-10-CM | POA: Diagnosis not present

## 2018-02-23 DIAGNOSIS — I493 Ventricular premature depolarization: Secondary | ICD-10-CM | POA: Diagnosis not present

## 2018-02-23 DIAGNOSIS — I429 Cardiomyopathy, unspecified: Secondary | ICD-10-CM | POA: Diagnosis not present

## 2018-02-23 DIAGNOSIS — G4733 Obstructive sleep apnea (adult) (pediatric): Secondary | ICD-10-CM | POA: Diagnosis not present

## 2018-03-03 DIAGNOSIS — B373 Candidiasis of vulva and vagina: Secondary | ICD-10-CM | POA: Diagnosis not present

## 2018-03-03 DIAGNOSIS — N930 Postcoital and contact bleeding: Secondary | ICD-10-CM | POA: Diagnosis not present

## 2018-03-03 DIAGNOSIS — N898 Other specified noninflammatory disorders of vagina: Secondary | ICD-10-CM | POA: Diagnosis not present

## 2018-03-04 DIAGNOSIS — F410 Panic disorder [episodic paroxysmal anxiety] without agoraphobia: Secondary | ICD-10-CM | POA: Diagnosis not present

## 2018-03-04 DIAGNOSIS — F250 Schizoaffective disorder, bipolar type: Secondary | ICD-10-CM | POA: Diagnosis not present

## 2018-03-04 DIAGNOSIS — F5105 Insomnia due to other mental disorder: Secondary | ICD-10-CM | POA: Diagnosis not present

## 2018-04-01 DIAGNOSIS — E119 Type 2 diabetes mellitus without complications: Secondary | ICD-10-CM | POA: Diagnosis not present

## 2018-04-01 DIAGNOSIS — I10 Essential (primary) hypertension: Secondary | ICD-10-CM | POA: Diagnosis not present

## 2018-04-01 DIAGNOSIS — Z79899 Other long term (current) drug therapy: Secondary | ICD-10-CM | POA: Diagnosis not present

## 2018-04-01 DIAGNOSIS — K219 Gastro-esophageal reflux disease without esophagitis: Secondary | ICD-10-CM | POA: Diagnosis not present

## 2018-04-01 DIAGNOSIS — E782 Mixed hyperlipidemia: Secondary | ICD-10-CM | POA: Diagnosis not present

## 2018-04-01 DIAGNOSIS — J4520 Mild intermittent asthma, uncomplicated: Secondary | ICD-10-CM | POA: Diagnosis not present

## 2018-04-08 DIAGNOSIS — F250 Schizoaffective disorder, bipolar type: Secondary | ICD-10-CM | POA: Diagnosis not present

## 2018-04-08 DIAGNOSIS — F410 Panic disorder [episodic paroxysmal anxiety] without agoraphobia: Secondary | ICD-10-CM | POA: Diagnosis not present

## 2018-04-08 DIAGNOSIS — F5105 Insomnia due to other mental disorder: Secondary | ICD-10-CM | POA: Diagnosis not present

## 2018-05-22 DIAGNOSIS — L03313 Cellulitis of chest wall: Secondary | ICD-10-CM | POA: Diagnosis not present

## 2018-06-10 DIAGNOSIS — F250 Schizoaffective disorder, bipolar type: Secondary | ICD-10-CM | POA: Diagnosis not present

## 2018-06-10 DIAGNOSIS — F410 Panic disorder [episodic paroxysmal anxiety] without agoraphobia: Secondary | ICD-10-CM | POA: Diagnosis not present

## 2018-06-10 DIAGNOSIS — F5105 Insomnia due to other mental disorder: Secondary | ICD-10-CM | POA: Diagnosis not present

## 2018-06-11 ENCOUNTER — Other Ambulatory Visit
Admission: RE | Admit: 2018-06-11 | Discharge: 2018-06-11 | Disposition: A | Discharge status: Home / Self Care | Payer: PPO | Attending: Psychiatry | Admitting: Psychiatry

## 2018-06-11 DIAGNOSIS — F250 Schizoaffective disorder, bipolar type: Secondary | ICD-10-CM | POA: Insufficient documentation

## 2018-06-11 DIAGNOSIS — Z79899 Other long term (current) drug therapy: Secondary | ICD-10-CM | POA: Insufficient documentation

## 2018-06-11 DIAGNOSIS — F410 Panic disorder [episodic paroxysmal anxiety] without agoraphobia: Secondary | ICD-10-CM | POA: Insufficient documentation

## 2018-06-11 LAB — CBC WITH DIFFERENTIAL/PLATELET
Abs Immature Granulocytes: 0.03 10*3/uL (ref 0.00–0.07)
Basophils Absolute: 0.1 10*3/uL (ref 0.0–0.1)
Basophils Relative: 1 %
EOS ABS: 0.2 10*3/uL (ref 0.0–0.5)
Eosinophils Relative: 2 %
HEMATOCRIT: 37.8 % (ref 36.0–46.0)
Hemoglobin: 12 g/dL (ref 12.0–15.0)
IMMATURE GRANULOCYTES: 0 %
Lymphocytes Relative: 29 %
Lymphs Abs: 2.8 10*3/uL (ref 0.7–4.0)
MCH: 27.6 pg (ref 26.0–34.0)
MCHC: 31.7 g/dL (ref 30.0–36.0)
MCV: 86.9 fL (ref 80.0–100.0)
MONOS PCT: 6 %
Monocytes Absolute: 0.6 10*3/uL (ref 0.1–1.0)
NEUTROS PCT: 62 %
Neutro Abs: 6 10*3/uL (ref 1.7–7.7)
Platelets: 347 10*3/uL (ref 150–400)
RBC: 4.35 MIL/uL (ref 3.87–5.11)
RDW: 13.3 % (ref 11.5–15.5)
WBC: 9.8 10*3/uL (ref 4.0–10.5)
nRBC: 0 % (ref 0.0–0.2)

## 2018-06-11 LAB — FOLATE: Folate: 7.3 ng/mL (ref >5.9)

## 2018-06-11 LAB — COMPREHENSIVE METABOLIC PANEL
ALT: 23 U/L (ref 0–44)
AST: 29 U/L (ref 15–41)
Albumin: 3.9 g/dL (ref 3.5–5.0)
Alkaline Phosphatase: 61 U/L (ref 38–126)
Anion gap: 9 (ref 5–15)
BUN: 8 mg/dL (ref 6–20)
CO2: 22 mmol/L (ref 22–32)
CREATININE: 0.74 mg/dL (ref 0.44–1.00)
Calcium: 9.2 mg/dL (ref 8.9–10.3)
Chloride: 108 mmol/L (ref 98–111)
GFR calc Af Amer: >60 mL/min (ref >60)
Glucose, Bld: 162 mg/dL — ABNORMAL HIGH (ref 70–99)
POTASSIUM: 3.9 mmol/L (ref 3.5–5.1)
Sodium: 139 mmol/L (ref 135–145)
Total Bilirubin: 0.5 mg/dL (ref 0.3–1.2)
Total Protein: 7.1 g/dL (ref 6.5–8.1)

## 2018-06-11 LAB — LIPID PANEL
CHOLESTEROL: 122 mg/dL (ref 0–200)
HDL: 32 mg/dL — ABNORMAL LOW (ref >40)
LDL Cholesterol: 29 mg/dL (ref 0–99)
Total CHOL/HDL Ratio: 3.8 RATIO
Triglycerides: 307 mg/dL — ABNORMAL HIGH (ref <150)
VLDL: 61 mg/dL — ABNORMAL HIGH (ref 0–40)

## 2018-06-11 LAB — VITAMIN B12: VITAMIN B 12: 184 pg/mL (ref 180–914)

## 2018-06-11 LAB — LITHIUM LEVEL: LITHIUM LVL: 0.65 mmol/L (ref 0.60–1.20)

## 2018-06-11 LAB — TSH: TSH: 1.349 u[IU]/mL (ref 0.350–4.500)

## 2018-06-12 LAB — HEMOGLOBIN A1C
Hgb A1c MFr Bld: 6.9 % — ABNORMAL HIGH (ref 4.8–5.6)
Mean Plasma Glucose: 151 mg/dL

## 2018-07-01 ENCOUNTER — Emergency Department: Payer: PPO

## 2018-07-01 ENCOUNTER — Encounter: Payer: Self-pay | Admitting: Emergency Medicine

## 2018-07-01 ENCOUNTER — Emergency Department
Admission: EM | Admit: 2018-07-01 | Discharge: 2018-07-01 | Disposition: A | Discharge status: Home / Self Care | Payer: PPO | Attending: Emergency Medicine | Admitting: Emergency Medicine

## 2018-07-01 ENCOUNTER — Other Ambulatory Visit: Payer: Self-pay

## 2018-07-01 DIAGNOSIS — Z79899 Other long term (current) drug therapy: Secondary | ICD-10-CM | POA: Insufficient documentation

## 2018-07-01 DIAGNOSIS — J45909 Unspecified asthma, uncomplicated: Secondary | ICD-10-CM | POA: Diagnosis not present

## 2018-07-01 DIAGNOSIS — N730 Acute parametritis and pelvic cellulitis: Secondary | ICD-10-CM | POA: Diagnosis not present

## 2018-07-01 DIAGNOSIS — E278 Other specified disorders of adrenal gland: Secondary | ICD-10-CM | POA: Diagnosis not present

## 2018-07-01 DIAGNOSIS — R1031 Right lower quadrant pain: Secondary | ICD-10-CM | POA: Insufficient documentation

## 2018-07-01 DIAGNOSIS — E119 Type 2 diabetes mellitus without complications: Secondary | ICD-10-CM | POA: Diagnosis not present

## 2018-07-01 DIAGNOSIS — R102 Pelvic and perineal pain: Secondary | ICD-10-CM | POA: Diagnosis not present

## 2018-07-01 LAB — COMPREHENSIVE METABOLIC PANEL
ALT: 24 U/L (ref 0–44)
AST: 26 U/L (ref 15–41)
Albumin: 3.8 g/dL (ref 3.5–5.0)
Alkaline Phosphatase: 60 U/L (ref 38–126)
Anion gap: 8 (ref 5–15)
BILIRUBIN TOTAL: 0.6 mg/dL (ref 0.3–1.2)
BUN: 7 mg/dL (ref 6–20)
CO2: 25 mmol/L (ref 22–32)
Calcium: 9.2 mg/dL (ref 8.9–10.3)
Chloride: 105 mmol/L (ref 98–111)
Creatinine, Ser: 0.68 mg/dL (ref 0.44–1.00)
GFR calc Af Amer: >60 mL/min (ref >60)
Glucose, Bld: 168 mg/dL — ABNORMAL HIGH (ref 70–99)
Potassium: 3.9 mmol/L (ref 3.5–5.1)
Sodium: 138 mmol/L (ref 135–145)
Total Protein: 7.3 g/dL (ref 6.5–8.1)

## 2018-07-01 LAB — URINALYSIS, COMPLETE (UACMP) WITH MICROSCOPIC
Bilirubin Urine: NEGATIVE
Glucose, UA: NEGATIVE mg/dL
Hgb urine dipstick: NEGATIVE
Ketones, ur: NEGATIVE mg/dL
Leukocytes,Ua: NEGATIVE
Nitrite: NEGATIVE
Protein, ur: NEGATIVE mg/dL
Specific Gravity, Urine: 1.006 (ref 1.005–1.030)
pH: 7 (ref 5.0–8.0)

## 2018-07-01 LAB — CBC
HCT: 36.1 % (ref 36.0–46.0)
Hemoglobin: 11.8 g/dL — ABNORMAL LOW (ref 12.0–15.0)
MCH: 27.9 pg (ref 26.0–34.0)
MCHC: 32.7 g/dL (ref 30.0–36.0)
MCV: 85.3 fL (ref 80.0–100.0)
PLATELETS: 297 10*3/uL (ref 150–400)
RBC: 4.23 MIL/uL (ref 3.87–5.11)
RDW: 13.6 % (ref 11.5–15.5)
WBC: 13.2 10*3/uL — AB (ref 4.0–10.5)
nRBC: 0 % (ref 0.0–0.2)

## 2018-07-01 LAB — PREGNANCY, URINE: Preg Test, Ur: NEGATIVE

## 2018-07-01 LAB — WET PREP, GENITAL
Clue Cells Wet Prep HPF POC: NONE SEEN
Sperm: NONE SEEN
TRICH WET PREP: NONE SEEN
YEAST WET PREP: NONE SEEN

## 2018-07-01 LAB — CHLAMYDIA/NGC RT PCR (ARMC ONLY)
Chlamydia Tr: NOT DETECTED
N gonorrhoeae: NOT DETECTED

## 2018-07-01 LAB — LIPASE, BLOOD: Lipase: 25 U/L (ref 11–51)

## 2018-07-01 MED ORDER — KETOROLAC TROMETHAMINE 10 MG PO TABS: 10.0000 mg | ORAL_TABLET | Freq: Three times a day (TID) | ORAL | 0 refills | Status: DC | PRN

## 2018-07-01 MED ORDER — KETOROLAC TROMETHAMINE 30 MG/ML IJ SOLN
30.0000 mg | Freq: Once | INTRAMUSCULAR | Status: AC
  Administered 2018-07-01: 30 mg via INTRAVENOUS
  Filled 2018-07-01: qty 1

## 2018-07-01 MED ORDER — CEFTRIAXONE SODIUM 250 MG IJ SOLR
250.0000 mg | Freq: Once | INTRAMUSCULAR | Status: AC
  Administered 2018-07-01: 250 mg via INTRAMUSCULAR
  Filled 2018-07-01: qty 250

## 2018-07-01 MED ORDER — LIDOCAINE HCL (PF) 1 % IJ SOLN
INTRAMUSCULAR | Status: AC
  Administered 2018-07-01: 0.9 mL
  Filled 2018-07-01: qty 5

## 2018-07-01 MED ORDER — DOXYCYCLINE HYCLATE 100 MG PO CAPS: 100.0000 mg | ORAL_CAPSULE | Freq: Two times a day (BID) | ORAL | 0 refills | Status: AC

## 2018-07-01 MED ORDER — SODIUM CHLORIDE 0.9% FLUSH: 3.0000 mL | Freq: Once | INTRAVENOUS | Status: DC

## 2018-07-01 NOTE — ED Provider Notes (Signed)
[facility] Emergency Department Provider Note   ____________________________________________   I have reviewed the triage vital signs and the nursing notes.   HISTORY  Chief Complaint Abdominal Pain   History limited by: Not Limited   HPI Susan Tanner is a 38 y.o. female who presents to the emergency department today because of concern for "ovarian pain." Patient states that she has had ovarian cysts in the past and that her pain today feels similar to pain she has had in the past. It has been present for the past two days. Worse with movement or walking. Pain is primarily in the left lower quadrant but she has had some pain in the right lower quadrant as well. She denies any fevers, nausea or vomiting. Denies requiring surgery for her ovarian cysts in the past.    Per medical record review patient has a history of endometriosis.   Past Medical History:  Diagnosis Date  . Anxiety   . Asthma   . Bipolar 1 disorder ([place])   . Depression   . Diabetes mellitus without complication ([place])   . Endometriosis   . Heart abnormality     Patient Active Problem List   Diagnosis Date Noted  . Endometriosis determined by laparoscopy 01/01/2017    Past Surgical History:  Procedure Laterality Date  . ABDOMINAL HYSTERECTOMY    . APPENDECTOMY    . TUBAL LIGATION      Prior to Admission medications   Medication Sig Start Date End Date Taking? Authorizing Provider  albuterol (PROVENTIL HFA;VENTOLIN HFA) 108 (90 Base) MCG/ACT inhaler Inhale 2 puffs into the lungs every 6 (six) hours as needed for wheezing or shortness of breath. Patient can only tolerate ventolin inhaler 02/17/16   Frederich Cha, MD  ALPRAZolam Duanne Moron) 1 MG tablet Take 1 mg by mouth at bedtime as needed for anxiety.    [provider]  carvedilol (COREG) 3.125 MG tablet Take 3.125 mg by mouth 2 (two) times daily with a meal.    [provider]  dicyclomine (BENTYL) 20 MG tablet  Take 20 mg by mouth every 6 (six) hours.    [provider]  hydrOXYzine (ATARAX/VISTARIL) 50 MG tablet Take 50 mg by mouth Nightly.     [provider]  lithium carbonate (ESKALITH) 450 MG CR tablet Take by mouth 2 (two) times daily. pt takes 1 in the morning and 2 at night    [provider]  pantoprazole (PROTONIX) 40 MG tablet Take 40 mg by mouth daily.    [provider]  QUEtiapine (SEROQUEL) 400 MG tablet Take 400 mg by mouth at bedtime.    [provider]  topiramate (TOPAMAX) 25 MG tablet Take 50 mg by mouth 2 (two) times daily.     [provider]    Allergies Ceftin [cefuroxime axetil]; Claritin-d 12 hour [loratadine-pseudoephedrine er]; Geodon [ziprasidone hcl]; Percocet [oxycodone-acetaminophen]; Lamictal [lamotrigine]; and Tape  Family History  Problem Relation Age of Onset  . Diabetes Maternal Grandfather   . Diabetes Paternal Grandfather   . Cancer Cousin   . Brain cancer Cousin     Social History Social History   Tobacco Use  . Smoking status: Never Smoker  . Smokeless tobacco: Never Used  Substance Use Topics  . Alcohol use: No  . Drug use: No    Review of Systems Constitutional: No fever/chills Eyes: No visual changes. ENT: No sore throat. Cardiovascular: Denies chest pain. Respiratory: Denies shortness of breath. Gastrointestinal: Positive for left  lower quadrant pain.  Genitourinary: Negative for dysuria. Musculoskeletal: Negative for back pain. Skin: Negative for rash. Neurological: Negative for headaches, focal weakness or numbness.  ____________________________________________   PHYSICAL EXAM:  VITAL SIGNS: ED Triage Vitals  Enc Vitals Group     BP 07/01/18 0838 126/71     Pulse Rate 07/01/18 0838 90     Resp 07/01/18 0838 18     Temp 07/01/18 0838 98.3 F (36.8 C)     Temp Source 07/01/18 0838 Oral     SpO2 07/01/18 0838 95 %     Weight 07/01/18 0842 267 lb (121.1 kg)     Height  07/01/18 0842 5\' 4"  (1.626 m)     Head Circumference --      Peak Flow --      Pain Score 07/01/18 0842 8   Constitutional: Alert and oriented.  Eyes: Conjunctivae are normal.  ENT      Head: Normocephalic and atraumatic.      Nose: No congestion/rhinnorhea.      Mouth/Throat: Mucous membranes are moist.      Neck: No stridor. Hematological/Lymphatic/Immunilogical: No cervical lymphadenopathy. Cardiovascular: Normal rate, regular rhythm.  No murmurs, rubs, or gallops.  Respiratory: Normal respiratory effort without tachypnea nor retractions. Breath sounds are clear and equal bilaterally. No wheezes/rales/rhonchi. Gastrointestinal: Soft and non tender. No rebound. No guarding.  Genitourinary: No external lesions. No blood or abnormal discharge in vaginal vault. CMT present.  Musculoskeletal: Normal range of motion in all extremities. No lower extremity edema. Neurologic:  Normal speech and language. No gross focal neurologic deficits are appreciated.  Skin:  Skin is warm, dry and intact. No rash noted. Psychiatric: Mood and affect are normal. Speech and behavior are normal. Patient exhibits appropriate insight and judgment.  ____________________________________________    LABS (pertinent positives/negatives)  Wet prep few WBC Chlamydia/gonorrhea not detected Upreg negative UA rare bacteria otherwise unremarkable CMP wnl except glu 168 CBC wbc 13.2, hgb 11.8, plt 297  ____________________________________________   EKG  None  ____________________________________________    RADIOLOGY  US pelvis Ovaries appear normal  ____________________________________________   PROCEDURES  Procedures  ____________________________________________   INITIAL IMPRESSION / ASSESSMENT AND PLAN / ED COURSE  Pertinent labs & imaging results that were available during my care of the patient were reviewed by me and considered in my medical decision making (see chart for  details).  Patient presented to the emergency department today because of concerns for "ovarian pain".  Patient is fully lower abdominal pain that she has had for 2 things.  States she has history of ovarian cyst.  On exam she does have some tenderness involvement.  Possible in the left side.  Pelvic exam did not show any abnormal vaginal discharge although patient had some CMT.  Did show some white blood cells. [place] without concerning findings for torsion, TOA, cyst.  Will treat for PID. Discussed findings and plan with patient.    ____________________________________________   FINAL CLINICAL IMPRESSION(S) / ED DIAGNOSES  Final diagnoses:  Left adnexal tenderness  Pelvic pain  PID (acute pelvic inflammatory disease)     Note: This dictation was prepared with Dragon dictation. Any transcriptional errors that result from this process are unintentional     Nance Pear, MD 07/01/18 1325

## 2018-07-01 NOTE — ED Triage Notes (Signed)
Pt to ED via POV c/o "ovarian pain". Pt states that the pain is worse on the left than the right. Pt states that she has been having pain x 2 days. Pt states that she has hx/o ovarian cyst. Pt is in NAD at this time.

## 2018-07-01 NOTE — ED Notes (Signed)
Pt called out inquiring on US> RN called to inquire, [place] on the way at this time.

## 2018-07-01 NOTE — Discharge Instructions (Addendum)
Please seek medical attention for any high fevers, chest pain, shortness of breath, change in behavior, persistent vomiting, bloody stool or any other new or concerning symptoms.  

## 2018-08-14 DIAGNOSIS — G4733 Obstructive sleep apnea (adult) (pediatric): Secondary | ICD-10-CM | POA: Diagnosis not present

## 2018-08-24 DIAGNOSIS — G4733 Obstructive sleep apnea (adult) (pediatric): Secondary | ICD-10-CM | POA: Diagnosis not present

## 2018-08-24 DIAGNOSIS — I420 Dilated cardiomyopathy: Secondary | ICD-10-CM | POA: Diagnosis not present

## 2018-08-24 DIAGNOSIS — E782 Mixed hyperlipidemia: Secondary | ICD-10-CM | POA: Diagnosis not present

## 2018-08-24 DIAGNOSIS — I493 Ventricular premature depolarization: Secondary | ICD-10-CM | POA: Diagnosis not present

## 2018-09-04 DIAGNOSIS — F5105 Insomnia due to other mental disorder: Secondary | ICD-10-CM | POA: Diagnosis not present

## 2018-09-04 DIAGNOSIS — F250 Schizoaffective disorder, bipolar type: Secondary | ICD-10-CM | POA: Diagnosis not present

## 2018-09-04 DIAGNOSIS — F410 Panic disorder [episodic paroxysmal anxiety] without agoraphobia: Secondary | ICD-10-CM | POA: Diagnosis not present

## 2018-10-01 DIAGNOSIS — I10 Essential (primary) hypertension: Secondary | ICD-10-CM | POA: Diagnosis not present

## 2018-10-01 DIAGNOSIS — K219 Gastro-esophageal reflux disease without esophagitis: Secondary | ICD-10-CM | POA: Diagnosis not present

## 2018-10-01 DIAGNOSIS — J4520 Mild intermittent asthma, uncomplicated: Secondary | ICD-10-CM | POA: Diagnosis not present

## 2018-10-01 DIAGNOSIS — Z79899 Other long term (current) drug therapy: Secondary | ICD-10-CM | POA: Diagnosis not present

## 2018-10-01 DIAGNOSIS — E119 Type 2 diabetes mellitus without complications: Secondary | ICD-10-CM | POA: Diagnosis not present

## 2018-10-01 DIAGNOSIS — E782 Mixed hyperlipidemia: Secondary | ICD-10-CM | POA: Diagnosis not present

## 2018-10-16 DIAGNOSIS — F5105 Insomnia due to other mental disorder: Secondary | ICD-10-CM | POA: Diagnosis not present

## 2018-10-16 DIAGNOSIS — F410 Panic disorder [episodic paroxysmal anxiety] without agoraphobia: Secondary | ICD-10-CM | POA: Diagnosis not present

## 2018-10-16 DIAGNOSIS — F250 Schizoaffective disorder, bipolar type: Secondary | ICD-10-CM | POA: Diagnosis not present

## 2018-12-31 DIAGNOSIS — F410 Panic disorder [episodic paroxysmal anxiety] without agoraphobia: Secondary | ICD-10-CM | POA: Diagnosis not present

## 2018-12-31 DIAGNOSIS — F250 Schizoaffective disorder, bipolar type: Secondary | ICD-10-CM | POA: Diagnosis not present

## 2018-12-31 DIAGNOSIS — F5105 Insomnia due to other mental disorder: Secondary | ICD-10-CM | POA: Diagnosis not present

## 2019-01-08 DIAGNOSIS — R42 Dizziness and giddiness: Secondary | ICD-10-CM | POA: Diagnosis not present

## 2019-01-08 DIAGNOSIS — E1165 Type 2 diabetes mellitus with hyperglycemia: Secondary | ICD-10-CM | POA: Diagnosis not present

## 2019-01-08 DIAGNOSIS — Z23 Encounter for immunization: Secondary | ICD-10-CM | POA: Diagnosis not present

## 2019-01-08 DIAGNOSIS — R531 Weakness: Secondary | ICD-10-CM | POA: Diagnosis not present

## 2019-01-20 DIAGNOSIS — E1165 Type 2 diabetes mellitus with hyperglycemia: Secondary | ICD-10-CM | POA: Diagnosis not present

## 2019-01-20 DIAGNOSIS — Z79899 Other long term (current) drug therapy: Secondary | ICD-10-CM | POA: Diagnosis not present

## 2019-02-23 DIAGNOSIS — E782 Mixed hyperlipidemia: Secondary | ICD-10-CM | POA: Diagnosis not present

## 2019-02-23 DIAGNOSIS — E119 Type 2 diabetes mellitus without complications: Secondary | ICD-10-CM | POA: Diagnosis not present

## 2019-02-23 DIAGNOSIS — G4733 Obstructive sleep apnea (adult) (pediatric): Secondary | ICD-10-CM | POA: Diagnosis not present

## 2019-02-23 DIAGNOSIS — I429 Cardiomyopathy, unspecified: Secondary | ICD-10-CM | POA: Diagnosis not present

## 2019-02-23 DIAGNOSIS — I420 Dilated cardiomyopathy: Secondary | ICD-10-CM | POA: Diagnosis not present

## 2019-02-23 DIAGNOSIS — I493 Ventricular premature depolarization: Secondary | ICD-10-CM | POA: Diagnosis not present

## 2019-03-24 DIAGNOSIS — Z20828 Contact with and (suspected) exposure to other viral communicable diseases: Secondary | ICD-10-CM | POA: Diagnosis not present

## 2019-03-29 DIAGNOSIS — F250 Schizoaffective disorder, bipolar type: Secondary | ICD-10-CM | POA: Diagnosis not present

## 2019-03-29 DIAGNOSIS — F5105 Insomnia due to other mental disorder: Secondary | ICD-10-CM | POA: Diagnosis not present

## 2019-03-29 DIAGNOSIS — F410 Panic disorder [episodic paroxysmal anxiety] without agoraphobia: Secondary | ICD-10-CM | POA: Diagnosis not present

## 2019-04-05 DIAGNOSIS — F250 Schizoaffective disorder, bipolar type: Secondary | ICD-10-CM | POA: Diagnosis not present

## 2019-04-05 DIAGNOSIS — I10 Essential (primary) hypertension: Secondary | ICD-10-CM | POA: Diagnosis not present

## 2019-04-05 DIAGNOSIS — K219 Gastro-esophageal reflux disease without esophagitis: Secondary | ICD-10-CM | POA: Diagnosis not present

## 2019-04-05 DIAGNOSIS — J4520 Mild intermittent asthma, uncomplicated: Secondary | ICD-10-CM | POA: Diagnosis not present

## 2019-04-05 DIAGNOSIS — F410 Panic disorder [episodic paroxysmal anxiety] without agoraphobia: Secondary | ICD-10-CM | POA: Diagnosis not present

## 2019-04-05 DIAGNOSIS — Z79899 Other long term (current) drug therapy: Secondary | ICD-10-CM | POA: Diagnosis not present

## 2019-04-05 DIAGNOSIS — Z1159 Encounter for screening for other viral diseases: Secondary | ICD-10-CM | POA: Diagnosis not present

## 2019-04-05 DIAGNOSIS — Z0000 Encounter for general adult medical examination without abnormal findings: Secondary | ICD-10-CM | POA: Diagnosis not present

## 2019-04-05 DIAGNOSIS — E1165 Type 2 diabetes mellitus with hyperglycemia: Secondary | ICD-10-CM | POA: Diagnosis not present

## 2019-04-05 DIAGNOSIS — E782 Mixed hyperlipidemia: Secondary | ICD-10-CM | POA: Diagnosis not present

## 2019-04-26 ENCOUNTER — Ambulatory Visit: Payer: PPO | Attending: Internal Medicine

## 2019-04-26 DIAGNOSIS — Z20822 Contact with and (suspected) exposure to covid-19: Secondary | ICD-10-CM

## 2019-04-28 LAB — NOVEL CORONAVIRUS, NAA: SARS-CoV-2, NAA: NOT DETECTED

## 2019-05-14 ENCOUNTER — Other Ambulatory Visit: Payer: PPO

## 2019-05-17 ENCOUNTER — Other Ambulatory Visit: Payer: PPO

## 2019-05-17 DIAGNOSIS — E1165 Type 2 diabetes mellitus with hyperglycemia: Secondary | ICD-10-CM | POA: Diagnosis not present

## 2019-05-17 DIAGNOSIS — N61 Mastitis without abscess: Secondary | ICD-10-CM | POA: Diagnosis not present

## 2019-05-18 ENCOUNTER — Other Ambulatory Visit: Payer: PPO

## 2019-05-18 ENCOUNTER — Ambulatory Visit: Payer: PPO | Attending: Internal Medicine

## 2019-05-18 DIAGNOSIS — Z20822 Contact with and (suspected) exposure to covid-19: Secondary | ICD-10-CM | POA: Diagnosis not present

## 2019-05-19 LAB — NOVEL CORONAVIRUS, NAA: SARS-CoV-2, NAA: NOT DETECTED

## 2019-06-19 DIAGNOSIS — F250 Schizoaffective disorder, bipolar type: Secondary | ICD-10-CM | POA: Diagnosis not present

## 2019-06-19 DIAGNOSIS — I493 Ventricular premature depolarization: Secondary | ICD-10-CM | POA: Diagnosis not present

## 2019-06-19 DIAGNOSIS — F419 Anxiety disorder, unspecified: Secondary | ICD-10-CM | POA: Diagnosis not present

## 2019-06-19 DIAGNOSIS — Z91048 Other nonmedicinal substance allergy status: Secondary | ICD-10-CM | POA: Diagnosis not present

## 2019-06-19 DIAGNOSIS — Z79899 Other long term (current) drug therapy: Secondary | ICD-10-CM | POA: Diagnosis not present

## 2019-06-19 DIAGNOSIS — Z888 Allergy status to other drugs, medicaments and biological substances status: Secondary | ICD-10-CM | POA: Diagnosis not present

## 2019-06-19 DIAGNOSIS — Z885 Allergy status to narcotic agent status: Secondary | ICD-10-CM | POA: Diagnosis not present

## 2019-06-19 DIAGNOSIS — N611 Abscess of the breast and nipple: Secondary | ICD-10-CM | POA: Diagnosis not present

## 2019-06-19 DIAGNOSIS — E785 Hyperlipidemia, unspecified: Secondary | ICD-10-CM | POA: Diagnosis not present

## 2019-06-19 DIAGNOSIS — J45909 Unspecified asthma, uncomplicated: Secondary | ICD-10-CM | POA: Diagnosis not present

## 2019-06-19 DIAGNOSIS — E119 Type 2 diabetes mellitus without complications: Secondary | ICD-10-CM | POA: Diagnosis not present

## 2019-06-19 DIAGNOSIS — I429 Cardiomyopathy, unspecified: Secondary | ICD-10-CM | POA: Diagnosis not present

## 2019-06-19 DIAGNOSIS — I10 Essential (primary) hypertension: Secondary | ICD-10-CM | POA: Diagnosis not present

## 2019-06-22 DIAGNOSIS — F410 Panic disorder [episodic paroxysmal anxiety] without agoraphobia: Secondary | ICD-10-CM | POA: Diagnosis not present

## 2019-06-22 DIAGNOSIS — F5105 Insomnia due to other mental disorder: Secondary | ICD-10-CM | POA: Diagnosis not present

## 2019-06-22 DIAGNOSIS — F250 Schizoaffective disorder, bipolar type: Secondary | ICD-10-CM | POA: Diagnosis not present

## 2019-06-24 DIAGNOSIS — L0390 Cellulitis, unspecified: Secondary | ICD-10-CM | POA: Diagnosis not present

## 2019-06-24 DIAGNOSIS — E119 Type 2 diabetes mellitus without complications: Secondary | ICD-10-CM | POA: Diagnosis not present

## 2019-07-05 DIAGNOSIS — N611 Abscess of the breast and nipple: Secondary | ICD-10-CM | POA: Diagnosis not present

## 2019-07-15 DIAGNOSIS — F410 Panic disorder [episodic paroxysmal anxiety] without agoraphobia: Secondary | ICD-10-CM | POA: Diagnosis not present

## 2019-07-15 DIAGNOSIS — F5105 Insomnia due to other mental disorder: Secondary | ICD-10-CM | POA: Diagnosis not present

## 2019-07-15 DIAGNOSIS — F250 Schizoaffective disorder, bipolar type: Secondary | ICD-10-CM | POA: Diagnosis not present

## 2019-07-19 DIAGNOSIS — N6489 Other specified disorders of breast: Secondary | ICD-10-CM | POA: Diagnosis not present

## 2019-07-19 DIAGNOSIS — N61 Mastitis without abscess: Secondary | ICD-10-CM | POA: Diagnosis not present

## 2019-07-29 DIAGNOSIS — F319 Bipolar disorder, unspecified: Secondary | ICD-10-CM | POA: Diagnosis not present

## 2019-07-29 DIAGNOSIS — G4733 Obstructive sleep apnea (adult) (pediatric): Secondary | ICD-10-CM | POA: Diagnosis not present

## 2019-07-29 DIAGNOSIS — R609 Edema, unspecified: Secondary | ICD-10-CM | POA: Diagnosis not present

## 2019-08-04 DIAGNOSIS — E119 Type 2 diabetes mellitus without complications: Secondary | ICD-10-CM | POA: Diagnosis not present

## 2019-08-04 IMAGING — US US PELVIS COMPLETE WITH TRANSVAGINAL
1 series · 14 of 25 positions shown · non-contrast
Comparison: Abdominopelvic CT 10/12/2015. Images from outside
ultrasound at [facility]-no report.

CLINICAL DATA: Left adnexal tenderness. History of partial
hysterectomy.



[Series 1: us pelvis complete with transvaginal · 0.27mm/px · 14 of 66 slices shown]
[im 1/66]
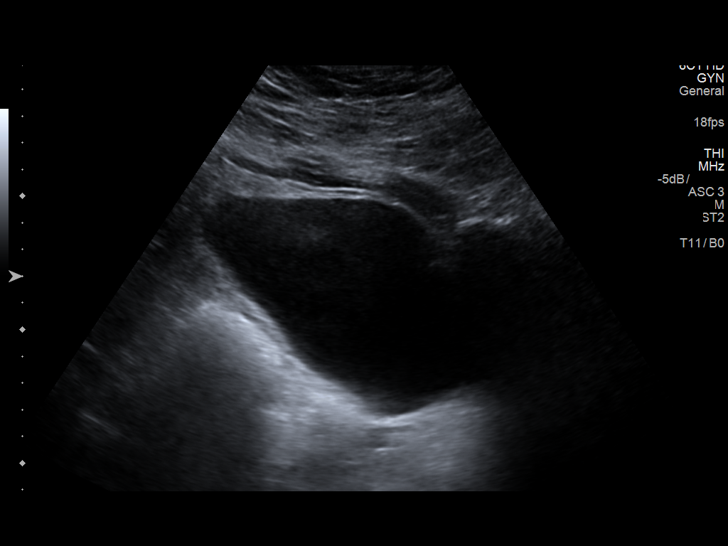
[im 6/66]
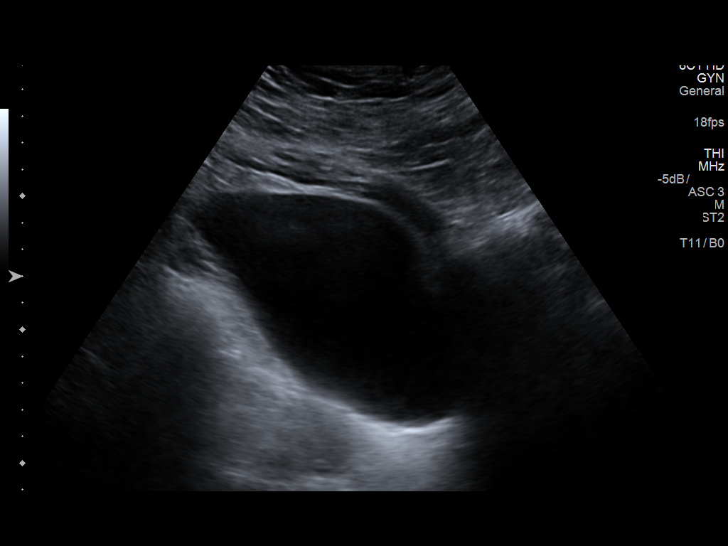
[im 11/66]
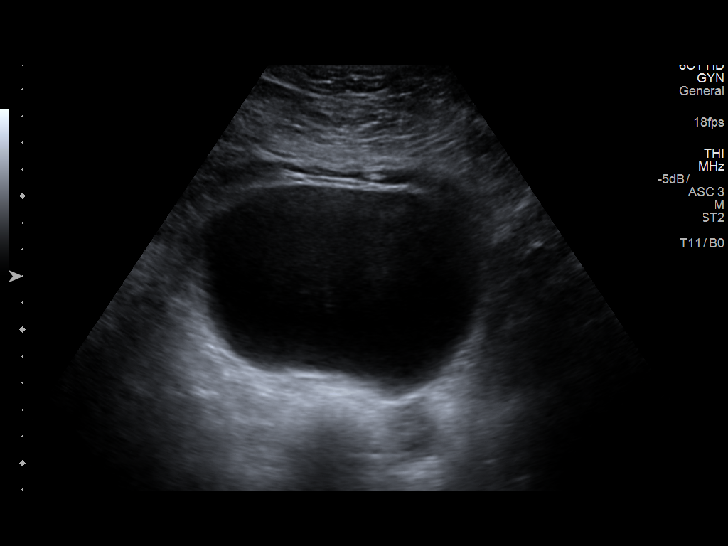
[im 17/66]
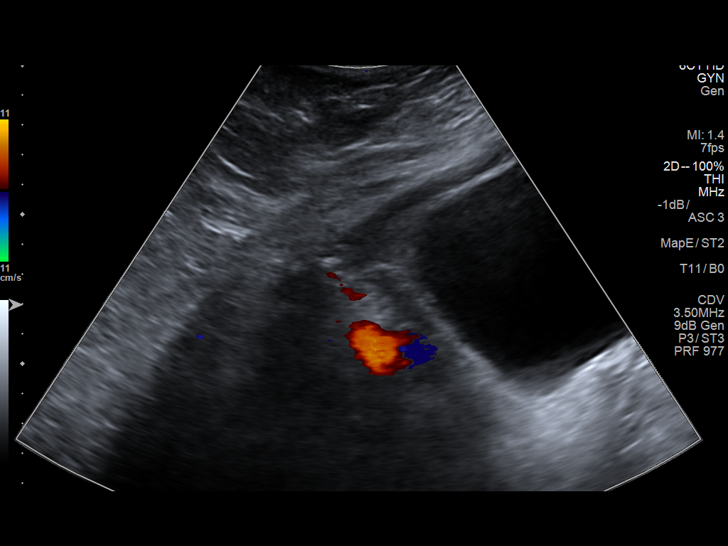
[im 22/66]
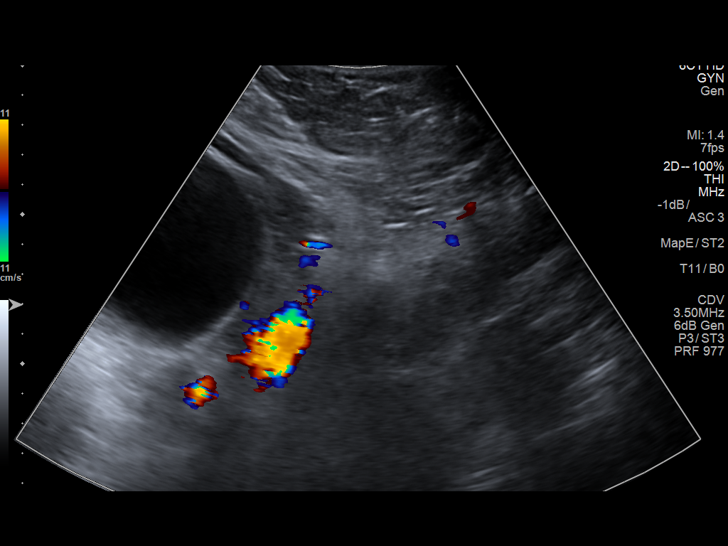
[im 25/66]
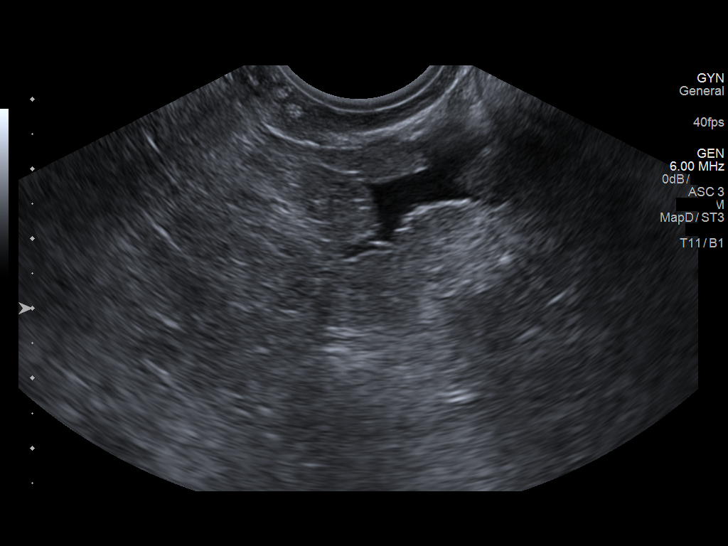
[im 30/66]
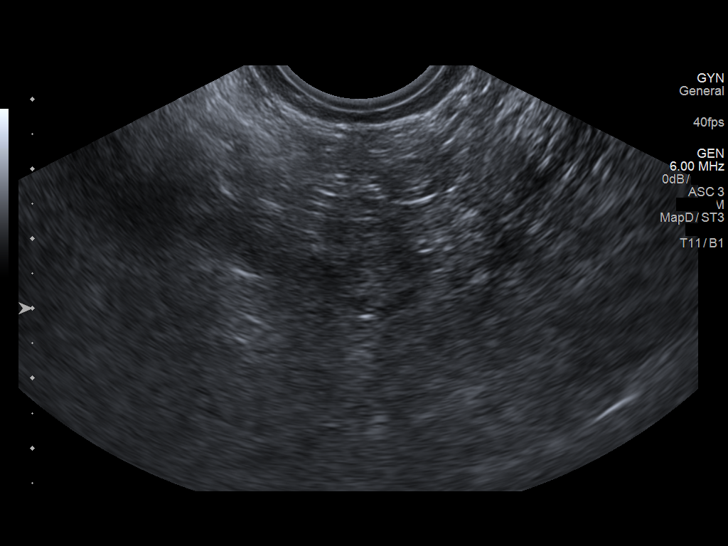
[im 36/66]
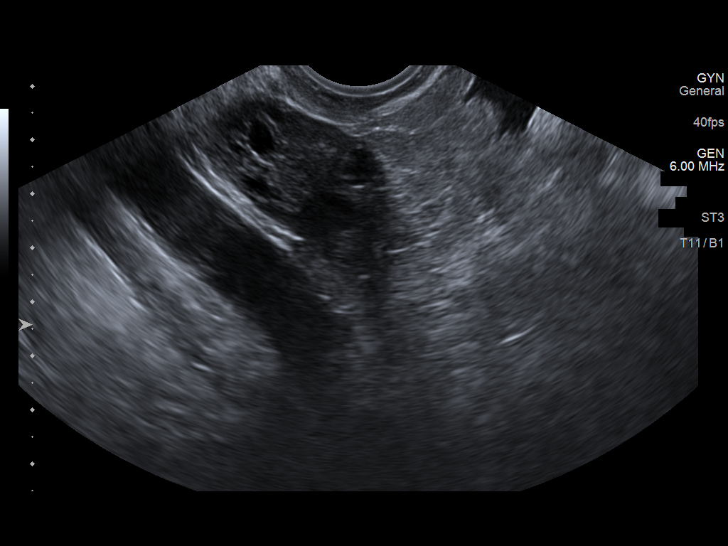
[im 41/66]
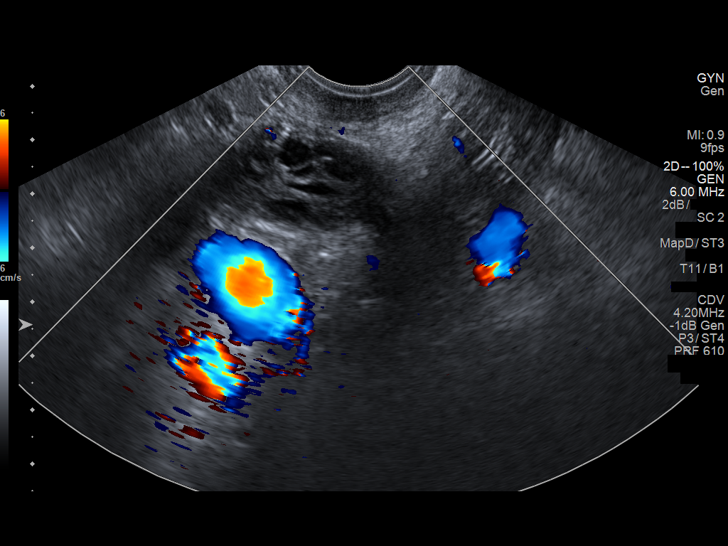
[im 44/66]
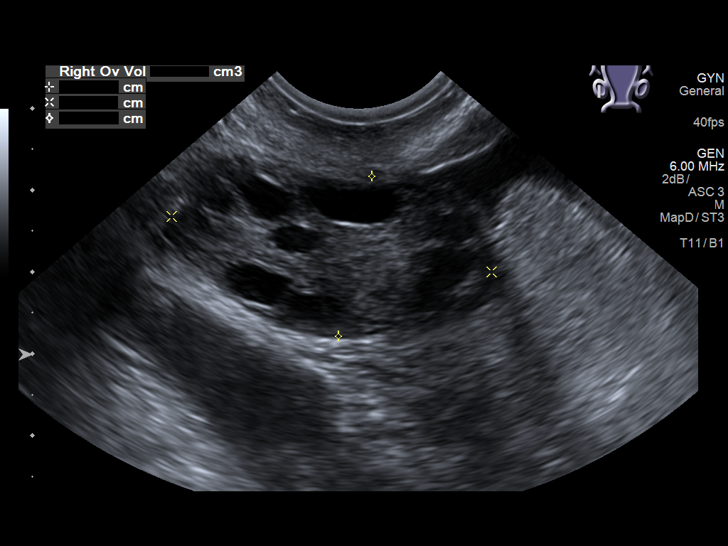
[im 49/66]
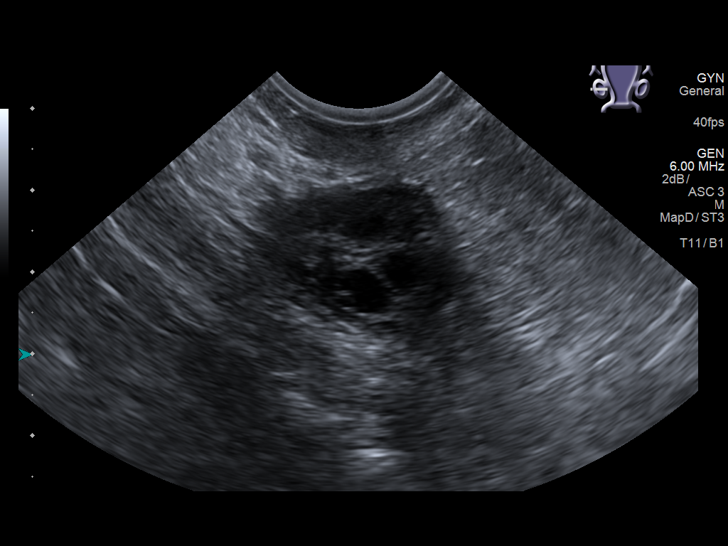
[im 55/66]
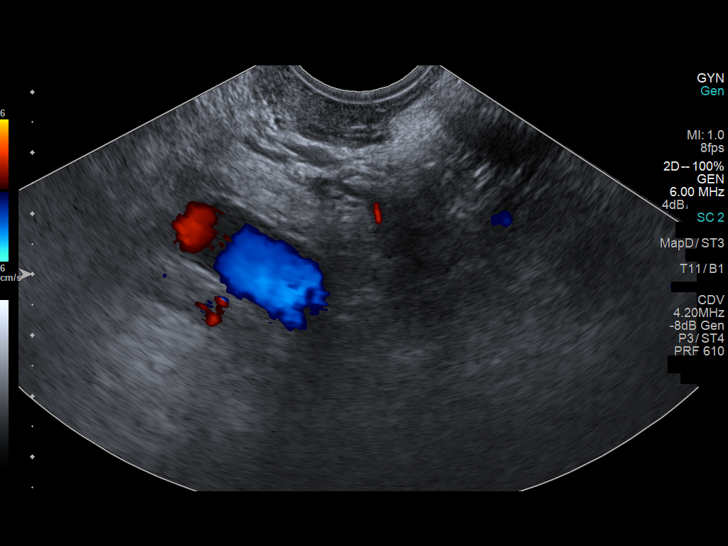
[im 60/66]
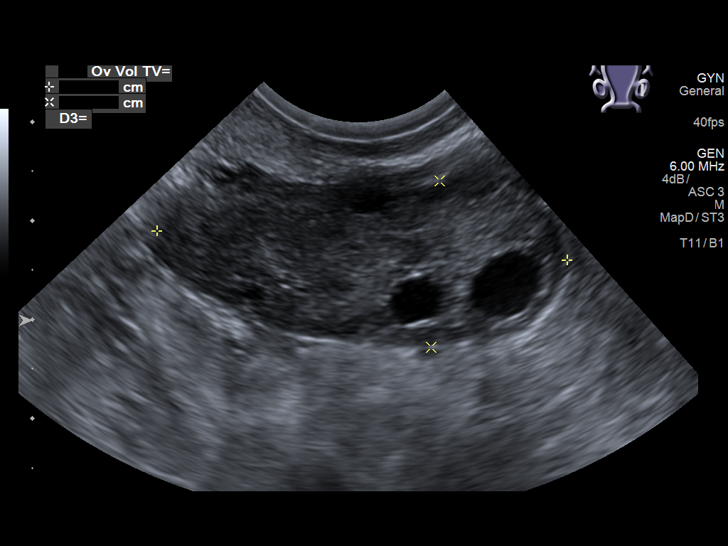
[im 66/66]
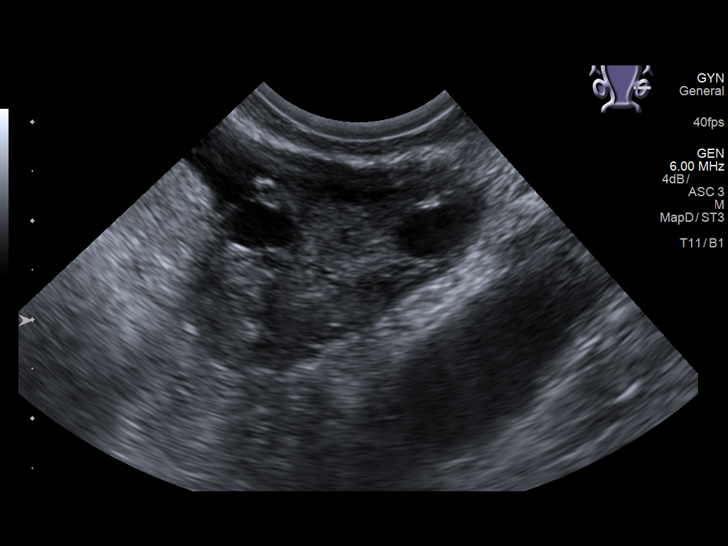

[14 of 25 positions shown; findings below may reference images not displayed]

FINDINGS: Uterus

Measurements: Surgically absent.

Right ovary

Measurements: 2.9 x 4.0 x 2.0 cm = volume: 12.0 mL. Normal
appearance/no adnexal mass.

Left ovary

Measurements: 4.2 x 1.7 x 2.0 cm = volume: 7.4 mL. Normal
appearance/no adnexal mass.

Other findings

Trace free pelvic fluid.
IMPRESSION: 1. Both ovaries appear normal.  No adnexal mass.
2. Previous hysterectomy.

## 2019-08-09 DIAGNOSIS — N611 Abscess of the breast and nipple: Secondary | ICD-10-CM | POA: Diagnosis not present

## 2019-08-24 DIAGNOSIS — E782 Mixed hyperlipidemia: Secondary | ICD-10-CM | POA: Diagnosis not present

## 2019-08-24 DIAGNOSIS — I420 Dilated cardiomyopathy: Secondary | ICD-10-CM | POA: Diagnosis not present

## 2019-08-24 DIAGNOSIS — E119 Type 2 diabetes mellitus without complications: Secondary | ICD-10-CM | POA: Diagnosis not present

## 2019-08-24 DIAGNOSIS — I493 Ventricular premature depolarization: Secondary | ICD-10-CM | POA: Diagnosis not present

## 2019-10-11 DIAGNOSIS — E782 Mixed hyperlipidemia: Secondary | ICD-10-CM | POA: Diagnosis not present

## 2019-10-11 DIAGNOSIS — F5105 Insomnia due to other mental disorder: Secondary | ICD-10-CM | POA: Diagnosis not present

## 2019-10-11 DIAGNOSIS — K219 Gastro-esophageal reflux disease without esophagitis: Secondary | ICD-10-CM | POA: Diagnosis not present

## 2019-10-11 DIAGNOSIS — I420 Dilated cardiomyopathy: Secondary | ICD-10-CM | POA: Diagnosis not present

## 2019-10-11 DIAGNOSIS — I10 Essential (primary) hypertension: Secondary | ICD-10-CM | POA: Diagnosis not present

## 2019-10-11 DIAGNOSIS — G4733 Obstructive sleep apnea (adult) (pediatric): Secondary | ICD-10-CM | POA: Diagnosis not present

## 2019-10-11 DIAGNOSIS — J4520 Mild intermittent asthma, uncomplicated: Secondary | ICD-10-CM | POA: Diagnosis not present

## 2019-10-11 DIAGNOSIS — F410 Panic disorder [episodic paroxysmal anxiety] without agoraphobia: Secondary | ICD-10-CM | POA: Diagnosis not present

## 2019-10-11 DIAGNOSIS — F250 Schizoaffective disorder, bipolar type: Secondary | ICD-10-CM | POA: Diagnosis not present

## 2019-10-11 DIAGNOSIS — E119 Type 2 diabetes mellitus without complications: Secondary | ICD-10-CM | POA: Diagnosis not present

## 2019-10-11 DIAGNOSIS — Z79899 Other long term (current) drug therapy: Secondary | ICD-10-CM | POA: Diagnosis not present

## 2019-10-20 DIAGNOSIS — M542 Cervicalgia: Secondary | ICD-10-CM | POA: Diagnosis not present

## 2020-01-10 DIAGNOSIS — F410 Panic disorder [episodic paroxysmal anxiety] without agoraphobia: Secondary | ICD-10-CM | POA: Diagnosis not present

## 2020-01-10 DIAGNOSIS — F250 Schizoaffective disorder, bipolar type: Secondary | ICD-10-CM | POA: Diagnosis not present

## 2020-01-10 DIAGNOSIS — F5105 Insomnia due to other mental disorder: Secondary | ICD-10-CM | POA: Diagnosis not present

## 2020-01-11 DIAGNOSIS — F410 Panic disorder [episodic paroxysmal anxiety] without agoraphobia: Secondary | ICD-10-CM | POA: Diagnosis not present

## 2020-01-11 DIAGNOSIS — F250 Schizoaffective disorder, bipolar type: Secondary | ICD-10-CM | POA: Diagnosis not present

## 2020-01-18 DIAGNOSIS — F410 Panic disorder [episodic paroxysmal anxiety] without agoraphobia: Secondary | ICD-10-CM | POA: Diagnosis not present

## 2020-01-18 DIAGNOSIS — F5105 Insomnia due to other mental disorder: Secondary | ICD-10-CM | POA: Diagnosis not present

## 2020-01-18 DIAGNOSIS — F250 Schizoaffective disorder, bipolar type: Secondary | ICD-10-CM | POA: Diagnosis not present

## 2020-01-26 DIAGNOSIS — Z23 Encounter for immunization: Secondary | ICD-10-CM | POA: Diagnosis not present

## 2020-01-26 DIAGNOSIS — M7062 Trochanteric bursitis, left hip: Secondary | ICD-10-CM | POA: Diagnosis not present

## 2020-02-14 DIAGNOSIS — F250 Schizoaffective disorder, bipolar type: Secondary | ICD-10-CM | POA: Diagnosis not present

## 2020-02-14 DIAGNOSIS — F5105 Insomnia due to other mental disorder: Secondary | ICD-10-CM | POA: Diagnosis not present

## 2020-02-14 DIAGNOSIS — F410 Panic disorder [episodic paroxysmal anxiety] without agoraphobia: Secondary | ICD-10-CM | POA: Diagnosis not present

## 2020-03-15 DIAGNOSIS — E782 Mixed hyperlipidemia: Secondary | ICD-10-CM | POA: Diagnosis not present

## 2020-03-15 DIAGNOSIS — I493 Ventricular premature depolarization: Secondary | ICD-10-CM | POA: Diagnosis not present

## 2020-03-15 DIAGNOSIS — I420 Dilated cardiomyopathy: Secondary | ICD-10-CM | POA: Diagnosis not present

## 2020-03-15 DIAGNOSIS — G4733 Obstructive sleep apnea (adult) (pediatric): Secondary | ICD-10-CM | POA: Diagnosis not present

## 2020-03-15 DIAGNOSIS — R0600 Dyspnea, unspecified: Secondary | ICD-10-CM | POA: Diagnosis not present

## 2020-03-28 DIAGNOSIS — I420 Dilated cardiomyopathy: Secondary | ICD-10-CM | POA: Diagnosis not present

## 2020-03-28 DIAGNOSIS — R0600 Dyspnea, unspecified: Secondary | ICD-10-CM | POA: Diagnosis not present

## 2020-03-30 DIAGNOSIS — E119 Type 2 diabetes mellitus without complications: Secondary | ICD-10-CM | POA: Diagnosis not present

## 2020-03-30 DIAGNOSIS — R222 Localized swelling, mass and lump, trunk: Secondary | ICD-10-CM | POA: Diagnosis not present

## 2020-03-30 DIAGNOSIS — L02211 Cutaneous abscess of abdominal wall: Secondary | ICD-10-CM | POA: Diagnosis not present

## 2020-04-03 DIAGNOSIS — F250 Schizoaffective disorder, bipolar type: Secondary | ICD-10-CM | POA: Diagnosis not present

## 2020-04-03 DIAGNOSIS — F5105 Insomnia due to other mental disorder: Secondary | ICD-10-CM | POA: Diagnosis not present

## 2020-04-03 DIAGNOSIS — F410 Panic disorder [episodic paroxysmal anxiety] without agoraphobia: Secondary | ICD-10-CM | POA: Diagnosis not present

## 2020-04-05 DIAGNOSIS — I420 Dilated cardiomyopathy: Secondary | ICD-10-CM | POA: Diagnosis not present

## 2020-04-05 DIAGNOSIS — E782 Mixed hyperlipidemia: Secondary | ICD-10-CM | POA: Diagnosis not present

## 2020-04-05 DIAGNOSIS — G4733 Obstructive sleep apnea (adult) (pediatric): Secondary | ICD-10-CM | POA: Diagnosis not present

## 2020-04-10 DIAGNOSIS — G4733 Obstructive sleep apnea (adult) (pediatric): Secondary | ICD-10-CM | POA: Diagnosis not present

## 2020-04-25 DIAGNOSIS — F319 Bipolar disorder, unspecified: Secondary | ICD-10-CM | POA: Diagnosis not present

## 2020-04-25 DIAGNOSIS — E119 Type 2 diabetes mellitus without complications: Secondary | ICD-10-CM | POA: Diagnosis not present

## 2020-04-25 DIAGNOSIS — Z79899 Other long term (current) drug therapy: Secondary | ICD-10-CM | POA: Diagnosis not present

## 2020-04-25 DIAGNOSIS — E782 Mixed hyperlipidemia: Secondary | ICD-10-CM | POA: Diagnosis not present

## 2020-04-25 DIAGNOSIS — Z0000 Encounter for general adult medical examination without abnormal findings: Secondary | ICD-10-CM | POA: Diagnosis not present

## 2020-04-25 DIAGNOSIS — I10 Essential (primary) hypertension: Secondary | ICD-10-CM | POA: Diagnosis not present

## 2020-04-25 DIAGNOSIS — G4733 Obstructive sleep apnea (adult) (pediatric): Secondary | ICD-10-CM | POA: Diagnosis not present

## 2020-04-25 DIAGNOSIS — J4520 Mild intermittent asthma, uncomplicated: Secondary | ICD-10-CM | POA: Diagnosis not present

## 2020-04-25 DIAGNOSIS — F250 Schizoaffective disorder, bipolar type: Secondary | ICD-10-CM | POA: Diagnosis not present

## 2020-04-25 DIAGNOSIS — K219 Gastro-esophageal reflux disease without esophagitis: Secondary | ICD-10-CM | POA: Diagnosis not present

## 2020-04-25 DIAGNOSIS — I420 Dilated cardiomyopathy: Secondary | ICD-10-CM | POA: Diagnosis not present

## 2020-06-01 DIAGNOSIS — F250 Schizoaffective disorder, bipolar type: Secondary | ICD-10-CM | POA: Diagnosis not present

## 2020-06-01 DIAGNOSIS — F5105 Insomnia due to other mental disorder: Secondary | ICD-10-CM | POA: Diagnosis not present

## 2020-06-01 DIAGNOSIS — F410 Panic disorder [episodic paroxysmal anxiety] without agoraphobia: Secondary | ICD-10-CM | POA: Diagnosis not present

## 2020-07-17 ENCOUNTER — Other Ambulatory Visit: Payer: Self-pay

## 2020-07-17 ENCOUNTER — Emergency Department
Admission: EM | Admit: 2020-07-17 | Discharge: 2020-07-17 | Disposition: A | Discharge status: Home / Self Care | Payer: PPO | Attending: Emergency Medicine | Admitting: Emergency Medicine

## 2020-07-17 ENCOUNTER — Emergency Department: Payer: PPO

## 2020-07-17 DIAGNOSIS — S61211A Laceration without foreign body of left index finger without damage to nail, initial encounter: Secondary | ICD-10-CM | POA: Diagnosis not present

## 2020-07-17 DIAGNOSIS — Z7984 Long term (current) use of oral hypoglycemic drugs: Secondary | ICD-10-CM | POA: Insufficient documentation

## 2020-07-17 DIAGNOSIS — R0781 Pleurodynia: Secondary | ICD-10-CM | POA: Diagnosis not present

## 2020-07-17 DIAGNOSIS — S63501A Unspecified sprain of right wrist, initial encounter: Secondary | ICD-10-CM | POA: Diagnosis not present

## 2020-07-17 DIAGNOSIS — Y92410 Unspecified street and highway as the place of occurrence of the external cause: Secondary | ICD-10-CM | POA: Insufficient documentation

## 2020-07-17 DIAGNOSIS — S6730XA Crushing injury of unspecified wrist, initial encounter: Secondary | ICD-10-CM | POA: Diagnosis not present

## 2020-07-17 DIAGNOSIS — S6991XA Unspecified injury of right wrist, hand and finger(s), initial encounter: Secondary | ICD-10-CM | POA: Diagnosis present

## 2020-07-17 DIAGNOSIS — J45909 Unspecified asthma, uncomplicated: Secondary | ICD-10-CM | POA: Diagnosis not present

## 2020-07-17 DIAGNOSIS — E119 Type 2 diabetes mellitus without complications: Secondary | ICD-10-CM | POA: Diagnosis not present

## 2020-07-17 DIAGNOSIS — R600 Localized edema: Secondary | ICD-10-CM | POA: Diagnosis not present

## 2020-07-17 DIAGNOSIS — M25531 Pain in right wrist: Secondary | ICD-10-CM | POA: Diagnosis not present

## 2020-07-17 MED ORDER — CYCLOBENZAPRINE HCL 5 MG PO TABS: 5.0000 mg | ORAL_TABLET | Freq: Three times a day (TID) | ORAL | 0 refills | Status: DC | PRN

## 2020-07-17 MED ORDER — TRAMADOL HCL 50 MG PO TABS: 50.0000 mg | ORAL_TABLET | Freq: Three times a day (TID) | ORAL | 0 refills | Status: AC | PRN

## 2020-07-17 MED ORDER — ACETAMINOPHEN 325 MG PO TABS
650.0000 mg | ORAL_TABLET | Freq: Once | ORAL | Status: AC
  Administered 2020-07-17: 650 mg via ORAL
  Filled 2020-07-17: qty 2

## 2020-07-17 MED ORDER — ONDANSETRON 4 MG PO TBDP
4.0000 mg | ORAL_TABLET | Freq: Once | ORAL | Status: AC
  Administered 2020-07-17: 4 mg via ORAL
  Filled 2020-07-17: qty 1
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
## 2020-07-17 NOTE — ED Notes (Signed)
Pt also c/o back pain. Denies CP and HA. Cap refill at R fingers less than 3 secs; R fingers warm and appropriate color. Pt can move wrist but not without significant pain.

## 2020-07-17 NOTE — ED Notes (Signed)
Non-adhesive dressing applied per order. R wrist brace applied per order. Pt educated.

## 2020-07-17 NOTE — ED Notes (Signed)
Pt given snack and drink by provider JB as is diabetic.

## 2020-07-17 NOTE — Discharge Instructions (Addendum)
Your exam and x-rays are normal and reassuring at this time.  No signs of any serious injury to the wrist, chest, or finger.  Take prescription medications as provided.  Take over-the-counter anti-inflammatories or Tylenol as needed.  Follow-up with your primary provider return to the ED if needed.

## 2020-07-20 ENCOUNTER — Other Ambulatory Visit: Payer: Self-pay | Admitting: Gerontology

## 2020-07-20 ENCOUNTER — Ambulatory Visit
Admission: RE | Admit: 2020-07-20 | Discharge: 2020-07-20 | Disposition: A | Discharge status: Home / Self Care | Payer: PPO | Source: Ambulatory Visit | Attending: Gerontology | Admitting: Gerontology

## 2020-07-20 ENCOUNTER — Other Ambulatory Visit: Payer: Self-pay

## 2020-07-20 DIAGNOSIS — Y939 Activity, unspecified: Secondary | ICD-10-CM | POA: Diagnosis not present

## 2020-07-20 DIAGNOSIS — M25552 Pain in left hip: Secondary | ICD-10-CM | POA: Diagnosis not present

## 2020-07-20 DIAGNOSIS — M79641 Pain in right hand: Secondary | ICD-10-CM | POA: Diagnosis not present

## 2020-07-20 DIAGNOSIS — G44311 Acute post-traumatic headache, intractable: Secondary | ICD-10-CM | POA: Diagnosis not present

## 2020-07-20 DIAGNOSIS — R110 Nausea: Secondary | ICD-10-CM | POA: Diagnosis not present

## 2020-07-20 DIAGNOSIS — M542 Cervicalgia: Secondary | ICD-10-CM | POA: Diagnosis not present

## 2020-07-20 DIAGNOSIS — Y929 Unspecified place or not applicable: Secondary | ICD-10-CM | POA: Diagnosis not present

## 2020-07-20 DIAGNOSIS — M25531 Pain in right wrist: Secondary | ICD-10-CM | POA: Diagnosis not present

## 2020-07-20 DIAGNOSIS — R519 Headache, unspecified: Secondary | ICD-10-CM | POA: Diagnosis not present

## 2020-07-29 DIAGNOSIS — M25551 Pain in right hip: Secondary | ICD-10-CM | POA: Diagnosis not present

## 2020-08-01 DIAGNOSIS — F250 Schizoaffective disorder, bipolar type: Secondary | ICD-10-CM | POA: Diagnosis not present

## 2020-08-01 DIAGNOSIS — F5105 Insomnia due to other mental disorder: Secondary | ICD-10-CM | POA: Diagnosis not present

## 2020-08-01 DIAGNOSIS — F410 Panic disorder [episodic paroxysmal anxiety] without agoraphobia: Secondary | ICD-10-CM | POA: Diagnosis not present

## 2020-08-01 DIAGNOSIS — F4311 Post-traumatic stress disorder, acute: Secondary | ICD-10-CM | POA: Diagnosis not present

## 2020-08-02 DIAGNOSIS — M7062 Trochanteric bursitis, left hip: Secondary | ICD-10-CM | POA: Diagnosis not present

## 2020-08-17 DIAGNOSIS — M7062 Trochanteric bursitis, left hip: Secondary | ICD-10-CM | POA: Diagnosis not present

## 2020-08-22 DIAGNOSIS — M25552 Pain in left hip: Secondary | ICD-10-CM | POA: Insufficient documentation

## 2020-08-25 DIAGNOSIS — S63501A Unspecified sprain of right wrist, initial encounter: Secondary | ICD-10-CM | POA: Diagnosis not present

## 2020-09-06 DIAGNOSIS — F319 Bipolar disorder, unspecified: Secondary | ICD-10-CM | POA: Diagnosis not present

## 2020-09-06 DIAGNOSIS — Z79899 Other long term (current) drug therapy: Secondary | ICD-10-CM | POA: Diagnosis not present

## 2020-09-06 DIAGNOSIS — E119 Type 2 diabetes mellitus without complications: Secondary | ICD-10-CM | POA: Diagnosis not present

## 2020-09-06 DIAGNOSIS — Z8639 Personal history of other endocrine, nutritional and metabolic disease: Secondary | ICD-10-CM | POA: Diagnosis not present

## 2020-09-06 DIAGNOSIS — R5383 Other fatigue: Secondary | ICD-10-CM | POA: Diagnosis not present

## 2020-09-06 DIAGNOSIS — K219 Gastro-esophageal reflux disease without esophagitis: Secondary | ICD-10-CM | POA: Diagnosis not present

## 2020-09-07 DIAGNOSIS — E538 Deficiency of other specified B group vitamins: Secondary | ICD-10-CM | POA: Diagnosis not present

## 2020-09-08 DIAGNOSIS — M25552 Pain in left hip: Secondary | ICD-10-CM | POA: Diagnosis not present

## 2020-09-12 DIAGNOSIS — M25552 Pain in left hip: Secondary | ICD-10-CM | POA: Diagnosis not present

## 2020-09-14 DIAGNOSIS — E538 Deficiency of other specified B group vitamins: Secondary | ICD-10-CM | POA: Diagnosis not present

## 2020-09-20 DIAGNOSIS — M25552 Pain in left hip: Secondary | ICD-10-CM | POA: Diagnosis not present

## 2020-09-22 DIAGNOSIS — E538 Deficiency of other specified B group vitamins: Secondary | ICD-10-CM | POA: Diagnosis not present

## 2020-09-27 DIAGNOSIS — M25552 Pain in left hip: Secondary | ICD-10-CM | POA: Diagnosis not present

## 2020-09-28 DIAGNOSIS — E538 Deficiency of other specified B group vitamins: Secondary | ICD-10-CM | POA: Diagnosis not present

## 2020-10-03 DIAGNOSIS — L03311 Cellulitis of abdominal wall: Secondary | ICD-10-CM | POA: Diagnosis not present

## 2020-10-03 DIAGNOSIS — N949 Unspecified condition associated with female genital organs and menstrual cycle: Secondary | ICD-10-CM | POA: Diagnosis not present

## 2020-10-03 DIAGNOSIS — E119 Type 2 diabetes mellitus without complications: Secondary | ICD-10-CM | POA: Diagnosis not present

## 2020-10-03 DIAGNOSIS — B009 Herpesviral infection, unspecified: Secondary | ICD-10-CM | POA: Diagnosis not present

## 2020-10-03 DIAGNOSIS — L570 Actinic keratosis: Secondary | ICD-10-CM | POA: Diagnosis not present

## 2020-10-20 ENCOUNTER — Other Ambulatory Visit (HOSPITAL_COMMUNITY)
Admission: RE | Admit: 2020-10-20 | Discharge: 2020-10-20 | Disposition: A | Discharge status: Home / Self Care | Payer: PPO | Source: Ambulatory Visit | Attending: Obstetrics and Gynecology | Admitting: Obstetrics and Gynecology

## 2020-10-20 ENCOUNTER — Other Ambulatory Visit: Payer: Self-pay

## 2020-10-20 ENCOUNTER — Encounter: Payer: Self-pay | Admitting: Obstetrics and Gynecology

## 2020-10-20 ENCOUNTER — Ambulatory Visit (INDEPENDENT_AMBULATORY_CARE_PROVIDER_SITE_OTHER): Payer: PPO | Admitting: Obstetrics and Gynecology

## 2020-10-20 VITALS — BP 124/70 | Ht 64.0 in | Wt 256.3 lb

## 2020-10-20 DIAGNOSIS — L02224 Furuncle of groin: Secondary | ICD-10-CM

## 2020-10-20 DIAGNOSIS — N9089 Other specified noninflammatory disorders of vulva and perineum: Secondary | ICD-10-CM

## 2020-10-20 DIAGNOSIS — B3731 Acute candidiasis of vulva and vagina: Secondary | ICD-10-CM

## 2020-10-20 DIAGNOSIS — N903 Dysplasia of vulva, unspecified: Secondary | ICD-10-CM | POA: Insufficient documentation

## 2020-10-20 DIAGNOSIS — B373 Candidiasis of vulva and vagina: Secondary | ICD-10-CM

## 2020-10-20 MED ORDER — SULFAMETHOXAZOLE-TRIMETHOPRIM 800-160 MG PO TABS: 1.0000 | ORAL_TABLET | Freq: Two times a day (BID) | ORAL | 0 refills | Status: AC

## 2020-10-20 MED ORDER — CLINDAMYCIN PHOSPHATE 1 % EX GEL: CUTANEOUS | 0 refills | Status: AC

## 2020-10-20 MED ORDER — FLUCONAZOLE 150 MG PO TABS: 150.0000 mg | ORAL_TABLET | ORAL | 0 refills | Status: AC

## 2020-10-20 NOTE — Patient Instructions (Signed)
How to Take a [employer] A sitz bath is a warm water bath that may be used to care for your rectum, genital area, or the area between your rectum and genitals (perineum). In a sitz bath, the water only comes up to your hips and covers your buttocks. A sitz bath may be done in a bathtub or with a portable sitz baththat fits over the toilet. Your health care provider may recommend a sitz bath to help: Relieve pain and discomfort after delivering a baby. Relieve pain and itching from hemorrhoids or anal fissures. Relieve pain after certain surgeries. Relax muscles that are sore or tight. How to take a sitz bath Take 3-4 sitz baths a day, or as many as told by your health care provider. Bathtub sitz bath To take a sitz bath in a bathtub: Partially fill a bathtub with warm water. The water should be deep enough to cover your hips and buttocks when you are sitting in the tub. Follow your health care provider's instructions if you are told to put medicine in the water. Sit in the water. Open the tub drain a little, and leave it open during your bath. Turn on the warm water again, enough to replace the water that is draining out. Keep the water running throughout your bath. This helps keep the water at the right level and temperature. Soak in the water for 15-20 minutes, or as long as told by your health care provider. When you are done, be careful when you stand up. You may feel dizzy. After the sitz bath, pat yourself dry. Do not rub your skin to dry it.  Over-the-toilet sitz bath To take a sitz bath with an over-the-toilet basin: Follow the manufacturer's instructions. Fill the basin with warm water. Follow your health care provider's instructions if you were told to put medicine in the water. Sit on the seat. Make sure the water covers your buttocks and perineum. Soak in the water for 15-20 minutes, or as long as told by your health care provider. After the sitz bath, pat yourself dry. Do not  rub your skin to dry it. Clean and dry the basin between uses. Discard the basin if it cracks, or according to the manufacturer's instructions.  Contact a health care provider if: Your pain or itching gets worse. Do not continue with sitz baths if your symptoms get worse. You have new symptoms. Do not continue with sitz baths until you talk with your health care provider. Summary A sitz bath is a warm water bath in which the water only comes up to your hips and covers your buttocks. A sitz bath may help relieve pain and discomfort after delivering a baby. It also may help with pain and itching from hemorrhoids or anal fissures, or pain after certain surgeries. It can also help to relax muscles that are sore or tight. Take 3-4 sitz baths a day, or as many as told by your health care provider. Soak in the water for 15-20 minutes. Do not continue with sitz baths if your symptoms get worse. This information is not intended to replace advice given to you by your health care provider. Make sure you discuss any questions you have with your healthcare provider. Document Revised: 12/23/2019 Document Reviewed: 12/23/2019 Elsevier Patient Education  2022 [employer].

## 2020-10-20 NOTE — Progress Notes (Signed)
Patient ID: DAJAI Tanner, female   DOB: [birth year], 40 y.o.   MRN: [id number]  Reason for Consult: Routine Prenatal Visit   Referred by Sallee Lange, *  Subjective:     HPI:  Susan Tanner is a 40 y.o. female she is here today because she has noticed for the last month a bump on her vulvar lesion.  She reports that her primary care physician was uncertain what it may be.  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  She was treated with Valtrex but did not has not had a resolution of the lesion.  She reports that her viral culture for herpes was also negative.    Gynecological History  No LMP recorded. Patient has had a hysterectomy.  She reports that her hysterectomy was performed for endometriosis.  She does report a history of a LEEP prior to the surgery.   Past Medical History:  Diagnosis Date   Anxiety    Asthma    Bipolar 1 disorder ([place])    Depression    Diabetes mellitus without complication ([facility])    Endometriosis    Heart abnormality    Family History  Problem Relation Age of Onset   Diabetes Maternal Grandfather    Diabetes Paternal Grandfather    Cancer Cousin    Brain cancer Cousin    Past Surgical History:  Procedure Laterality Date   ABDOMINAL HYSTERECTOMY     APPENDECTOMY     TUBAL LIGATION      Short Social History:  Social History   Tobacco Use   Smoking status: Never   Smokeless tobacco: Never  Substance Use Topics   Alcohol use: No    Allergies  Allergen Reactions   Depakote Er [Divalproex Sodium Er] Other (See Comments)    Affects liver enzymes   Cefuroxime Axetil Other (See Comments)   Loratadine-Pseudoephedrine Er     Muscle pain. Other reaction(s): Muscle Pain   Oxycodone-Acetaminophen Itching   Percocet [Oxycodone-Acetaminophen] Itching   Valproic Acid     Other reaction(s): Liver Disorder "affects liver" Other reaction(s): Liver Disorder     Ziprasidone Hcl Other (See Comments)    Inflames her liver, elevated LFTs Other reaction(s): Liver Disorder   Cariprazine Palpitations   Lamictal [Lamotrigine] Rash   Tape Rash    Current Outpatient Medications  Medication Sig Dispense Refill   albuterol (PROVENTIL HFA;VENTOLIN HFA) 108 (90 Base) MCG/ACT inhaler Inhale 2 puffs into the lungs every 6 (six) hours as needed for wheezing or shortness of breath. Patient can only tolerate ventolin inhaler 1 Inhaler 0   citalopram (CELEXA) 10 MG tablet Take 10 mg by mouth daily.     hydrOXYzine (ATARAX/VISTARIL) 50 MG tablet Take 50 mg by mouth Nightly.      lithium carbonate (ESKALITH) 450 MG CR tablet Take by mouth 2 (two) times daily. pt takes 1 in the morning and 2 at night     metFORMIN (GLUCOPHAGE-XR) 500 MG 24 hr tablet Take 500 mg by mouth daily.     pantoprazole (PROTONIX) 40 MG tablet Take 40 mg by mouth daily.     rosuvastatin (CRESTOR) 5 MG tablet Take 5 mg by mouth daily.     ALPRAZolam (XANAX) 1 MG tablet Take 1 mg by mouth at bedtime as needed for anxiety. (Patient not taking: Reported on 10/20/2020)     benztropine (COGENTIN) 2 MG tablet Take 2 mg  by mouth 2 (two) times daily. (Patient not taking: Reported on 10/20/2020)     cyclobenzaprine (FLEXERIL) 5 MG tablet Take 1 tablet (5 mg total) by mouth 3 (three) times daily as needed. (Patient not taking: Reported on 10/20/2020) 15 tablet 0   lisinopril (PRINIVIL,ZESTRIL) 10 MG tablet Take 10 mg by mouth daily.     ondansetron (ZOFRAN) 4 MG tablet Take 4 mg by mouth as needed. (Patient not taking: Reported on 10/20/2020)     SAPHRIS 10 MG SUBL Take 20 mg by mouth at bedtime. (Patient not taking: Reported on 10/20/2020)     No current facility-administered medications for this visit.    Review of Systems  Constitutional: Negative for chills, fatigue, fever and unexpected weight change.  HENT: Negative for trouble swallowing.  Eyes: Negative for loss of vision.  Respiratory: Negative for  cough, shortness of breath and wheezing.  Cardiovascular: Negative for chest pain, leg swelling, palpitations and syncope.  GI: Negative for abdominal pain, blood in stool, diarrhea, nausea and vomiting.  GU: Negative for difficulty urinating, dysuria, frequency and hematuria.  Musculoskeletal: Negative for back pain, leg pain and joint pain.  Skin: Negative for rash.  Neurological: Negative for dizziness, headaches, light-headedness, numbness and seizures.  Psychiatric: Negative for behavioral problem, confusion, depressed mood and sleep disturbance.       Objective:  Objective   Vitals:   10/20/20 1506  BP: 124/70  Weight: 256 lb 4.8 oz (116.3 kg)  Height: 5\' 4"  (1.626 m)   Body mass index is 43.99 kg/m.  Physical Exam Genitourinary:    VULVAR BIOPSY NOTE The indications for vulvar biopsy (rule out neoplasia, establish lichen sclerosus diagnosis) were reviewed.   Risks of the biopsy including pain, bleeding, infection, inadequate specimen, scarring and need for additional procedures  were discussed. The patient stated understanding and agreed to undergo procedure today. Consent was signed,  time out performed.   The patient's vulva was prepped with Betadine. 1% lidocaine was injected into area of concern. A 3 -mm punch biopsy was done, biopsy tissue was picked up with sterile forceps and sterile scissors were used to excise the lesion.  Small bleeding was noted and hemostasis was achieved using silver nitrate sticks and a single 3-0 Vicryl suture.  The patient tolerated the procedure well. Post-procedure instructions  (pelvic rest for one week) were given to the patient. The patient is to call with heavy bleeding, fever greater than 100.4, foul smelling vaginal discharge or other concerns.   Assessment/Plan:     39 year old here with vulvar lesions  Skin boils consistent with hidradenitis suppurativa -topical antibiotic ointment as well as 10-day course of Bactrim.  Advised  twice a day sitz bath's. Yeast vaginitis we will treat with Diflucan Biopsy of vulvar lesion will follow up in 1 week with the patient for results and to monitor.  Advised to keep the area clean and to take sitz bath twice a day.    Adrian Prows MD [facility] 10/20/2020 4:49 PM

## 2020-10-24 DIAGNOSIS — D071 Carcinoma in situ of vulva: Secondary | ICD-10-CM | POA: Diagnosis not present

## 2020-10-24 DIAGNOSIS — N766 Ulceration of vulva: Secondary | ICD-10-CM | POA: Diagnosis not present

## 2020-10-25 ENCOUNTER — Ambulatory Visit: Payer: PPO | Admitting: Obstetrics and Gynecology

## 2020-10-25 ENCOUNTER — Encounter: Payer: Self-pay | Admitting: Obstetrics and Gynecology

## 2020-10-25 ENCOUNTER — Other Ambulatory Visit: Payer: Self-pay

## 2020-10-25 VITALS — BP 122/70 | Ht 64.0 in | Wt 253.4 lb

## 2020-10-25 DIAGNOSIS — N9089 Other specified noninflammatory disorders of vulva and perineum: Secondary | ICD-10-CM

## 2020-10-25 NOTE — Progress Notes (Signed)
Patient ID: Susan Tanner, female   DOB: March 27, 1981, 40 y.o.   MRN: [id number]  Reason for Consult: Gynecologic Exam   Referred by Sallee Lange, *  Subjective:     HPI:  Susan Tanner is a 40 y.o. female she is following up today from her vulvar biopsy.  She reports that she has been feeling well although that she has had a little bit of soreness and discomfort.  She denies any issues with bleeding.  Pathology result from her vulvar biopsy has not yet resulted.  Gynecological History  No LMP recorded. Patient has had a hysterectomy.   Past Medical History:  Diagnosis Date   Anxiety    Asthma    Bipolar 1 disorder ([place])    Depression    Diabetes mellitus without complication (HCC)    Endometriosis    Heart abnormality    Family History  Problem Relation Age of Onset   Diabetes Maternal Grandfather    Diabetes Paternal Grandfather    Cancer Cousin    Brain cancer Cousin    Past Surgical History:  Procedure Laterality Date   ABDOMINAL HYSTERECTOMY     APPENDECTOMY     TUBAL LIGATION      Short Social History:  Social History   Tobacco Use   Smoking status: Never   Smokeless tobacco: Never  Substance Use Topics   Alcohol use: No    Allergies  Allergen Reactions   Depakote Er [Divalproex Sodium Er] Other (See Comments)    Affects liver enzymes   Cefuroxime Axetil Other (See Comments)   Loratadine-Pseudoephedrine Er     Muscle pain. Other reaction(s): Muscle Pain   Oxycodone-Acetaminophen Itching   Percocet [Oxycodone-Acetaminophen] Itching   Valproic Acid     Other reaction(s): Liver Disorder "affects liver" Other reaction(s): Liver Disorder    Ziprasidone Hcl Other (See Comments)    Inflames her liver, elevated LFTs Other reaction(s): Liver Disorder   Cariprazine Palpitations   Lamictal [Lamotrigine] Rash   Tape Rash    Current Outpatient Medications  Medication Sig Dispense Refill   albuterol (PROVENTIL HFA;VENTOLIN HFA) 108 (90 Base)  MCG/ACT inhaler Inhale 2 puffs into the lungs every 6 (six) hours as needed for wheezing or shortness of breath. Patient can only tolerate ventolin inhaler 1 Inhaler 0   citalopram (CELEXA) 10 MG tablet Take 10 mg by mouth daily.     clindamycin (CLINDAGEL) 1 % gel Apply to affected area 2 times daily 30 g 0   hydrOXYzine (ATARAX/VISTARIL) 50 MG tablet Take 50 mg by mouth Nightly.      lithium carbonate (ESKALITH) 450 MG CR tablet Take by mouth 2 (two) times daily. pt takes 1 in the morning and 2 at night     metFORMIN (GLUCOPHAGE-XR) 500 MG 24 hr tablet Take 500 mg by mouth daily.     pantoprazole (PROTONIX) 40 MG tablet Take 40 mg by mouth daily.     rosuvastatin (CRESTOR) 5 MG tablet Take 5 mg by mouth daily.     sulfamethoxazole-trimethoprim (BACTRIM DS) 800-160 MG tablet Take 1 tablet by mouth 2 (two) times daily for 10 days. 20 tablet 0   ALPRAZolam (XANAX) 1 MG tablet Take 1 mg by mouth at bedtime as needed for anxiety. (Patient not taking: Reported on 10/20/2020)     benztropine (COGENTIN) 2 MG tablet Take 2 mg by mouth 2 (two) times daily. (Patient not taking: Reported on 10/20/2020)     cyclobenzaprine (FLEXERIL) 5 MG tablet Take  1 tablet (5 mg total) by mouth 3 (three) times daily as needed. (Patient not taking: Reported on 10/20/2020) 15 tablet 0   lisinopril (PRINIVIL,ZESTRIL) 10 MG tablet Take 10 mg by mouth daily.     ondansetron (ZOFRAN) 4 MG tablet Take 4 mg by mouth as needed. (Patient not taking: Reported on 10/20/2020)     SAPHRIS 10 MG SUBL Take 20 mg by mouth at bedtime. (Patient not taking: Reported on 10/20/2020)     No current facility-administered medications for this visit.    Review of Systems  Constitutional: Negative for chills, fatigue, fever and unexpected weight change.  HENT: Negative for trouble swallowing.  Eyes: Negative for loss of vision.  Respiratory: Negative for cough, shortness of breath and wheezing.  Cardiovascular: Negative for chest pain, leg swelling,  palpitations and syncope.  GI: Negative for abdominal pain, blood in stool, diarrhea, nausea and vomiting.  GU: Negative for difficulty urinating, dysuria, frequency and hematuria.  Musculoskeletal: Negative for back pain, leg pain and joint pain.  Skin: Negative for rash.  Neurological: Negative for dizziness, headaches, light-headedness, numbness and seizures.  Psychiatric: Negative for behavioral problem, confusion, depressed mood and sleep disturbance.       Objective:  Objective   Vitals:   10/25/20 1041  BP: 122/70  Weight: 253 lb 6.4 oz (114.9 kg)  Height: 5\' 4"  (1.626 m)   Body mass index is 43.5 kg/m.  Physical Exam Vitals and nursing note reviewed. Exam conducted with a chaperone present.  Constitutional:      Appearance: Normal appearance. She is well-developed.  HENT:     Head: Normocephalic and atraumatic.  Eyes:     Extraocular Movements: Extraocular movements intact.     Pupils: Pupils are equal, round, and reactive to light.  Cardiovascular:     Rate and Rhythm: Normal rate and regular rhythm.  Pulmonary:     Effort: Pulmonary effort is normal. No respiratory distress.     Breath sounds: Normal breath sounds.  Abdominal:     General: Abdomen is flat.     Palpations: Abdomen is soft.  Genitourinary:    Comments: External: Well healed biopsy site, suture removed.  Musculoskeletal:        General: No signs of injury.  Skin:    General: Skin is warm and dry.  Neurological:     Mental Status: She is alert and oriented to person, place, and time.  Psychiatric:        Behavior: Behavior normal.        Thought Content: Thought content normal.        Judgment: Judgment normal.    Assessment/Plan:    40 year old with vulvar lesion pathology is still pending.  We will base follow-up based off of pathology result.  Good healing continue sitz baths for a week. Suture removed.  More than 10 minutes were spent face to face with the patient in the room,  reviewing the medical record, labs and images, and coordinating care for the patient. The plan of management was discussed in detail and counseling was provided.      Adrian Prows MD [facility] 10/25/2020 11:36 AM

## 2020-10-26 ENCOUNTER — Other Ambulatory Visit: Payer: Self-pay | Admitting: Obstetrics and Gynecology

## 2020-10-26 DIAGNOSIS — D072 Carcinoma in situ of vagina: Secondary | ICD-10-CM

## 2020-10-26 LAB — SURGICAL PATHOLOGY

## 2020-10-30 DIAGNOSIS — J4520 Mild intermittent asthma, uncomplicated: Secondary | ICD-10-CM | POA: Diagnosis not present

## 2020-10-30 DIAGNOSIS — K219 Gastro-esophageal reflux disease without esophagitis: Secondary | ICD-10-CM | POA: Diagnosis not present

## 2020-10-30 DIAGNOSIS — Z79899 Other long term (current) drug therapy: Secondary | ICD-10-CM | POA: Diagnosis not present

## 2020-10-30 DIAGNOSIS — E538 Deficiency of other specified B group vitamins: Secondary | ICD-10-CM | POA: Diagnosis not present

## 2020-10-30 DIAGNOSIS — I10 Essential (primary) hypertension: Secondary | ICD-10-CM | POA: Diagnosis not present

## 2020-10-30 DIAGNOSIS — F319 Bipolar disorder, unspecified: Secondary | ICD-10-CM | POA: Diagnosis not present

## 2020-10-30 DIAGNOSIS — G4733 Obstructive sleep apnea (adult) (pediatric): Secondary | ICD-10-CM | POA: Diagnosis not present

## 2020-10-30 DIAGNOSIS — I420 Dilated cardiomyopathy: Secondary | ICD-10-CM | POA: Diagnosis not present

## 2020-10-30 DIAGNOSIS — E119 Type 2 diabetes mellitus without complications: Secondary | ICD-10-CM | POA: Diagnosis not present

## 2020-10-30 DIAGNOSIS — E782 Mixed hyperlipidemia: Secondary | ICD-10-CM | POA: Diagnosis not present

## 2020-11-01 ENCOUNTER — Inpatient Hospital Stay: Payer: PPO | Attending: Obstetrics and Gynecology | Admitting: Obstetrics and Gynecology

## 2020-11-01 ENCOUNTER — Other Ambulatory Visit: Payer: Self-pay

## 2020-11-01 DIAGNOSIS — Z7689 Persons encountering health services in other specified circumstances: Secondary | ICD-10-CM | POA: Diagnosis not present

## 2020-11-01 DIAGNOSIS — Z6841 Body Mass Index (BMI) 40.0 and over, adult: Secondary | ICD-10-CM | POA: Insufficient documentation

## 2020-11-01 DIAGNOSIS — E669 Obesity, unspecified: Secondary | ICD-10-CM | POA: Diagnosis not present

## 2020-11-01 DIAGNOSIS — D071 Carcinoma in situ of vulva: Secondary | ICD-10-CM | POA: Diagnosis not present

## 2020-11-01 DIAGNOSIS — F17290 Nicotine dependence, other tobacco product, uncomplicated: Secondary | ICD-10-CM | POA: Diagnosis not present

## 2020-11-01 DIAGNOSIS — Z90710 Acquired absence of both cervix and uterus: Secondary | ICD-10-CM | POA: Insufficient documentation

## 2020-11-01 DIAGNOSIS — F319 Bipolar disorder, unspecified: Secondary | ICD-10-CM | POA: Insufficient documentation

## 2020-11-01 DIAGNOSIS — F419 Anxiety disorder, unspecified: Secondary | ICD-10-CM | POA: Insufficient documentation

## 2020-11-01 DIAGNOSIS — Z79899 Other long term (current) drug therapy: Secondary | ICD-10-CM | POA: Diagnosis not present

## 2020-11-01 DIAGNOSIS — E119 Type 2 diabetes mellitus without complications: Secondary | ICD-10-CM | POA: Diagnosis not present

## 2020-11-01 DIAGNOSIS — Z7984 Long term (current) use of oral hypoglycemic drugs: Secondary | ICD-10-CM | POA: Diagnosis not present

## 2020-11-01 NOTE — Progress Notes (Signed)
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 female who is seen in consultation from Dr. 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   Dr Gilman Schmidt - 10/20/20 Noticed a bump on her vulva.  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  She was treated with Valtrex but did not has not had a resolution of the lesion.  She reports that her viral culture for herpes was also negative. 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  Also some vulvar boils intermittently. She smokes cigars.  Says she had the HPV vaccine as adult.  The patient has a significant history of endometriosis. She has undergone a total abdominal hysterectomy in 2011.  Ovaries still in situ and no endometriosis symptoms.   Problem List: Patient Active Problem List   Diagnosis Date Noted   Endometriosis determined by laparoscopy 01/01/2017    Past Medical History: Past Medical History:  Diagnosis Date   Anxiety    Asthma    Bipolar 1 disorder ([place])    Depression    Diabetes mellitus without complication ([place])    Endometriosis    Heart abnormality     Past Surgical History: Past Surgical History:  Procedure Laterality Date   ABDOMINAL HYSTERECTOMY     APPENDECTOMY     TUBAL LIGATION       OB History:  OB History  Gravida Para Term Preterm AB Living  3 3 2 1   3   SAB IAB Ectopic Multiple Live Births          3    # Outcome Date GA Lbr Len/2nd Weight Sex Delivery Anes PTL Lv  3 Term 06/23/04    M Vag-Spont   LIV  2 Term 02/18/03    F Vag-Spont   LIV  1 Preterm 12/05/00    M Vag-Spont   LIV    Family History: Family History  Problem Relation Age of Onset   Diabetes Maternal Grandfather    Diabetes Paternal Grandfather    Cancer Cousin    Brain cancer Cousin     Social History: Social History   Socioeconomic History   Marital status: Married    Spouse name: Not on file    Number of children: Not on file   Years of education: Not on file   Highest education level: Not on file  Occupational History   Not on file  Tobacco Use   Smoking status: Never   Smokeless tobacco: Never  Vaping Use   Vaping Use: Never used  Substance and Sexual Activity   Alcohol use: No   Drug use: No   Sexual activity: Yes    Birth control/protection: None    Comment: Hysterectomy  Other Topics Concern   Not on file  Social History Narrative   Not on file   Social Determinants of Health   Financial Resource Strain: Not on file  Food Insecurity: Not on file  Transportation Needs: Not on file  Physical Activity: Not on file  Stress: Not on file  Social Connections: Not on file  Intimate Partner Violence: Not on file    Allergies: Allergies  Allergen Reactions   Depakote Er [Divalproex Sodium Er] Other (See Comments)    Affects liver enzymes   Cefuroxime Axetil Other (See Comments)   Loratadine-Pseudoephedrine Er     Muscle pain. Other reaction(s): Muscle Pain  Oxycodone-Acetaminophen Itching   Percocet [Oxycodone-Acetaminophen] Itching   Valproic Acid     Other reaction(s): Liver Disorder "affects liver" Other reaction(s): Liver Disorder    Ziprasidone Hcl Other (See Comments)    Inflames her liver, elevated LFTs Other reaction(s): Liver Disorder   Cariprazine Palpitations   Lamictal [Lamotrigine] Rash   Tape Rash    Current Medications: Current Outpatient Medications  Medication Sig Dispense Refill   albuterol (PROVENTIL HFA;VENTOLIN HFA) 108 (90 Base) MCG/ACT inhaler Inhale 2 puffs into the lungs every 6 (six) hours as needed for wheezing or shortness of breath. Patient can only tolerate ventolin inhaler 1 Inhaler 0   citalopram (CELEXA) 10 MG tablet Take 10 mg by mouth daily.     clindamycin (CLINDAGEL) 1 % gel Apply to affected area 2 times daily 30 g 0   hydrOXYzine (ATARAX/VISTARIL) 50 MG tablet Take 50 mg by mouth Nightly.      lisinopril  (PRINIVIL,ZESTRIL) 10 MG tablet Take 10 mg by mouth daily.     lithium carbonate (ESKALITH) 450 MG CR tablet Take by mouth 2 (two) times daily. pt takes 1 in the morning and 2 at night     metFORMIN (GLUCOPHAGE-XR) 500 MG 24 hr tablet Take 500 mg by mouth daily.     pantoprazole (PROTONIX) 40 MG tablet Take 40 mg by mouth daily.     rosuvastatin (CRESTOR) 5 MG tablet Take 5 mg by mouth daily.     traZODone (DESYREL) 50 MG tablet trazodone 50 mg tablet  TAKE 1 TABLET BY MOUTH AT BEDTIME AS NEEDED     ALPRAZolam (XANAX) 1 MG tablet Take 1 mg by mouth at bedtime as needed for anxiety. (Patient not taking: Reported on 10/20/2020)     benztropine (COGENTIN) 2 MG tablet Take 2 mg by mouth 2 (two) times daily. (Patient not taking: Reported on 10/20/2020)     cyclobenzaprine (FLEXERIL) 5 MG tablet Take 1 tablet (5 mg total) by mouth 3 (three) times daily as needed. (Patient not taking: Reported on 10/20/2020) 15 tablet 0   ondansetron (ZOFRAN) 4 MG tablet Take 4 mg by mouth as needed. (Patient not taking: No sig reported)     SAPHRIS 10 MG SUBL Take 20 mg by mouth at bedtime. (Patient not taking: Reported on 10/20/2020)     No current facility-administered medications for this visit.    Review of Systems General: negative for, fevers, chills, fatigue, changes in sleep, changes in weight or appetite Skin: negative for changes in color, texture, moles or lesions Eyes: negative for, changes in vision, pain, diplopia HEENT: negative for, change in hearing, pain, discharge, tinnitus, vertigo, voice changes, sore throat, neck masses Breasts: negative for breast lumps Pulmonary: negative for, dyspnea, orthopnea, productive cough Cardiac: negative for, palpitations, syncope, pain, discomfort, pressure Gastrointestinal: negative for, dysphagia, nausea, vomiting, jaundice, pain, constipation, diarrhea, hematemesis, hematochezia Musculoskeletal: negative for, pain, stiffness, swelling, range of motion  limitation Hematology: negative for, easy bruising, bleeding Neurologic/Psych: negative for, headaches, seizures, paralysis, weakness, tremor, change in gait, change in sensation, mood swings, depression, anxiety, change in memory  Objective:  Physical Examination:  BP 124/80   Pulse 81   Temp 97.8 F (36.6 C)   Resp 20   Wt 255 lb (115.7 kg)   SpO2 100%   BMI 43.77 kg/m    ECOG Performance Status: 1 - Symptomatic but completely ambulatory  General appearance: alert, cooperative, and appears stated age HEENT:PERRLA and thyroid without masses Lymph node survey: non-palpable, axillary, inguinal, supraclavicular Cardiovascular:  regular rate and rhythm, no murmurs or gallops Respiratory: normal air entry, lungs clear to auscultation and no rales, rhonchi or wheezing Abdomen: soft, non-tender, without masses or organomegaly, no hernias, and well healed incision Back: inspection of back is normal Extremities: extremities normal, atraumatic, no cyanosis or edema Skin exam - normal coloration and turgor, no rashes, no suspicious skin lesions noted. Neurological exam reveals alert, oriented, normal speech, no focal findings or movement disorder noted.  Pelvic: exam chaperoned by nurse;  Vulva: normal appearing vulva with no masses, tenderness or lesions; Vagina: normal vagina; Adnexa: normal adnexa in size, nontender and no masses; Uterus: surgically absent, vaginal cuff well healed; Cervix: absent; Rectal: normal rectal, no masses   Colposcopy and vulvar biopsy performed after signed consent and time out.  Patient tolerated the procedure well with minimal discomfort. Observed 4 x 9 mm area of raised white epithelium at 5 o'clock that had been biopsied and fainter area at 10.  Brown 5 mm lesion to right of clitoris.  Round and flat.  Betadine used to clean area and 2 ml of xylocaine infiltrated.  Lesion biopsied off completely and base touched up with silver nitrate.      Assessment:  Susan Tanner is a 40 y.o. 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   Medical co-morbidities complicating care: obesity BMI 44.  Plan:   Problem List Items Addressed This Visit       Genitourinary   VIN III (vulvar intraepithelial neoplasia III)   We discussed options for management including laser, WLE and Aldera.  Will get path result from the excisional biopsy of the brown lesion removed today.  Hopefully, this is benign. Then will RTC in two weeks to start [facility], as she is a smoker and has multifocal disease in at least two areas.    The patient's diagnosis, an outline of the further diagnostic and laboratory studies which will be required, the recommendation, and alternatives were discussed.  All questions were answered to the patient's satisfaction.  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    Mellody Drown, MD   CC:  [address], [address] [place],  [place] [address] [phone]

## 2020-11-01 NOTE — Patient Instructions (Signed)
Imiquimod skin cream What is this medication? IMIQUIMOD (i mi KWI mod) cream is used to treat external genital or anal warts. It is also used to treat other skin conditions such as actinic keratosis andcertain types of skin cancer. This medicine may be used for other purposes; ask your health care provider orpharmacist if you have questions. COMMON BRAND NAME(S): Aldara, Zyclara What should I tell my care team before I take this medication? They need to know if you have any of these conditions: decreased immune function an unusual or allergic reaction to imiquimod, other medicines, foods, dyes, or preservatives pregnant or trying to get pregnant breast-feeding How should I use this medication? This medicine is for external use only. Do not take by mouth. Follow the directions on the prescription label. Apply just before bedtime. Wash your hands before and after use. Apply a thin layer of cream and massage gently into the affected areas until no longer visible. Do not use in the mouth, eyes or the vagina. Use this medicine only on the affected area as directed by your health care provider. Do not use for longer than prescribed. It is important not to use more medicine than prescribed. To do so may increase the chance ofside effects. Talk to your pediatrician regarding the use of this medicine in children. While this drug may be prescribed for children as young as 75 years of age forselected conditions, precautions do apply. Overdosage: If you think you have taken too much of this medicine contact apoison control center or emergency room at once. NOTE: This medicine is only for you. Do not share this medicine with others. What if I miss a dose? If you miss a dose, use it as soon as you can. If it is almost time for yournext dose, use only that dose. Do not use double or extra doses. What may interact with this medication? Interactions are not expected. Do not use any other medicines on the  treatedarea without asking your doctor or health care professional. This list may not describe all possible interactions. Give your health care provider a list of all the medicines, herbs, non-prescription drugs, or dietary supplements you use. Also tell them if you smoke, drink alcohol, or use illegaldrugs. Some items may interact with your medicine. What should I watch for while using this medication? Visit your health care professional for regular checks on your progress. Do not use this medicine until the skin has healed from any other drug(example: podofilox or podophyllin resin) or surgical skin treatment. Females should receive regular pelvic exams while being treated for genital warts. Most patients see improvement within 4 weeks. It may take up to 16 weeks to see a full clearing of the warts. This medicine is not a cure. New warts may develop during or after treatment. Avoid sexual (genital, anal, oral) contact while the cream is on the skin. If warts are visible in the genital area, sexual contact should be avoided until the warts are treated. The use of latex condoms during sexual contact may reduce, but not entirely prevent, infecting others. This medicine may weaken condoms, diaphragms, cervical caps or other barrier devices and make them less effective as birth control. Do not cover the treated area with an airtight bandage. Cotton gauze dressings can be used. Cotton underwear can be worn after using this medicine on the genital or analarea. Actinic keratoses that were not seen before may appear during treatment and may later go away. The treatment area and surrounding area may  lighten or darken after treatment with this medicine. These skin color changes may be permanentin some patients. If you experience a skin reaction at the treatment site that interferes or prevents you from doing any daily activity, contact your health care provider. You may need a rest period from treatment. Treatment may  be restarted once the reaction has gotten better as recommended by your doctor or health careprofessional. This medicine can make you more sensitive to the sun. Keep out of the sun. If you cannot avoid being in the sun, wear protective clothing and use sunscreen.Do not use sun lamps or tanning beds/booths. What side effects may I notice from receiving this medication? Side effects that you should report to your doctor or health care professionalas soon as possible: open sores with or without drainage skin infection skin rash unusual or severe skin reaction Side effects that usually do not require medical attention (report to yourdoctor or health care professional if they continue or are bothersome): burning or itching redness of the skin (very common but is usually not painful or harmful) scabbing, crusting, or peeling skin skin that becomes hard or thickened swelling of the skin This list may not describe all possible side effects. Call your doctor for medical advice about side effects. You may report side effects to FDA at1-800-FDA-1088. Where should I keep my medication? Keep out of the reach of children. Store between 4 and 25 degrees C (39 and 77 degrees F). Do not freeze. Throw away any unused medicine after the expiration date. Discard packet afterapplying to affected area. Partial packets should not be saved or reused. NOTE: This sheet is a summary. It may not cover all possible information. If you have questions about this medicine, talk to your doctor, pharmacist, orhealth care provider.  2022 Elsevier/Gold Standard (2008-03-22 10:33:25)

## 2020-11-02 DIAGNOSIS — F410 Panic disorder [episodic paroxysmal anxiety] without agoraphobia: Secondary | ICD-10-CM | POA: Diagnosis not present

## 2020-11-02 DIAGNOSIS — F4311 Post-traumatic stress disorder, acute: Secondary | ICD-10-CM | POA: Diagnosis not present

## 2020-11-02 DIAGNOSIS — F5105 Insomnia due to other mental disorder: Secondary | ICD-10-CM | POA: Diagnosis not present

## 2020-11-02 DIAGNOSIS — Z79899 Other long term (current) drug therapy: Secondary | ICD-10-CM | POA: Diagnosis not present

## 2020-11-02 DIAGNOSIS — F250 Schizoaffective disorder, bipolar type: Secondary | ICD-10-CM | POA: Diagnosis not present

## 2020-11-03 LAB — SURGICAL PATHOLOGY

## 2020-11-09 DIAGNOSIS — G4733 Obstructive sleep apnea (adult) (pediatric): Secondary | ICD-10-CM | POA: Diagnosis not present

## 2020-11-14 DIAGNOSIS — F410 Panic disorder [episodic paroxysmal anxiety] without agoraphobia: Secondary | ICD-10-CM | POA: Diagnosis not present

## 2020-11-14 DIAGNOSIS — F250 Schizoaffective disorder, bipolar type: Secondary | ICD-10-CM | POA: Diagnosis not present

## 2020-11-15 ENCOUNTER — Encounter: Payer: Self-pay | Admitting: Nurse Practitioner

## 2020-11-15 ENCOUNTER — Inpatient Hospital Stay (HOSPITAL_BASED_OUTPATIENT_CLINIC_OR_DEPARTMENT_OTHER): Payer: PPO | Admitting: Nurse Practitioner

## 2020-11-15 VITALS — BP 113/67 | HR 81 | Temp 97.8°F | Resp 20 | Wt 258.3 lb

## 2020-11-15 DIAGNOSIS — F250 Schizoaffective disorder, bipolar type: Secondary | ICD-10-CM | POA: Insufficient documentation

## 2020-11-15 DIAGNOSIS — D071 Carcinoma in situ of vulva: Secondary | ICD-10-CM

## 2020-11-15 DIAGNOSIS — K219 Gastro-esophageal reflux disease without esophagitis: Secondary | ICD-10-CM | POA: Insufficient documentation

## 2020-11-15 DIAGNOSIS — J4520 Mild intermittent asthma, uncomplicated: Secondary | ICD-10-CM | POA: Insufficient documentation

## 2020-11-15 DIAGNOSIS — F4310 Post-traumatic stress disorder, unspecified: Secondary | ICD-10-CM | POA: Insufficient documentation

## 2020-11-15 DIAGNOSIS — G8929 Other chronic pain: Secondary | ICD-10-CM | POA: Insufficient documentation

## 2020-11-15 DIAGNOSIS — F419 Anxiety disorder, unspecified: Secondary | ICD-10-CM | POA: Insufficient documentation

## 2020-11-15 DIAGNOSIS — F319 Bipolar disorder, unspecified: Secondary | ICD-10-CM | POA: Insufficient documentation

## 2020-11-15 DIAGNOSIS — F259 Schizoaffective disorder, unspecified: Secondary | ICD-10-CM | POA: Insufficient documentation

## 2020-11-15 MED ORDER — IMIQUIMOD 5 % EX CREA: TOPICAL_CREAM | CUTANEOUS | 0 refills | Status: DC
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
## 2020-11-15 NOTE — Patient Instructions (Signed)
Vulvar Aldara Cream Application  INSTRUCTIONS Apply the Aldara cream by applying thin layer topically to vulva at bedtime every other night at bedtime.  Leave on skin overnight then removed with soap and water in the morning.  Leave on skin for 6-10 hours.   To avoid the cream from getting on nontreatment sites, you can apply Desitin or Vaseline to nontreated skin surfaces to avoid contact. We recommend handwashing before and after cream application.  Avoid using excessive amounts of cream.  Aldara 5% cream was packaged and single-use packets which contain sufficient cream to cover the area.  Apply the cream externally only.  Apply a thin layer of cream and rub into the affected area until it is no longer visible.  The application site should not be covered.   Local skin reactions including redness, swelling, discomfort, at the treatment site are common.  A rest period of several days may be taken if needed to allow the skin to rest.  Treatment may resume once the reaction subsides.

## 2020-11-16 ENCOUNTER — Encounter: Payer: Self-pay | Admitting: Nurse Practitioner

## 2020-11-17 ENCOUNTER — Encounter: Payer: Self-pay | Admitting: Nurse Practitioner

## 2020-11-17 DIAGNOSIS — L732 Hidradenitis suppurativa: Secondary | ICD-10-CM | POA: Diagnosis not present

## 2020-11-17 DIAGNOSIS — L578 Other skin changes due to chronic exposure to nonionizing radiation: Secondary | ICD-10-CM | POA: Diagnosis not present

## 2020-11-17 DIAGNOSIS — D225 Melanocytic nevi of trunk: Secondary | ICD-10-CM | POA: Diagnosis not present

## 2020-11-17 DIAGNOSIS — Z79899 Other long term (current) drug therapy: Secondary | ICD-10-CM | POA: Diagnosis not present

## 2020-11-17 DIAGNOSIS — L570 Actinic keratosis: Secondary | ICD-10-CM | POA: Diagnosis not present

## 2020-11-20 ENCOUNTER — Ambulatory Visit: Payer: PPO | Admitting: Nurse Practitioner

## 2020-11-29 ENCOUNTER — Other Ambulatory Visit: Payer: Self-pay

## 2020-11-29 ENCOUNTER — Inpatient Hospital Stay: Payer: PPO | Attending: Obstetrics and Gynecology | Admitting: Nurse Practitioner

## 2020-11-29 VITALS — BP 109/51 | HR 82 | Temp 98.7°F | Resp 20 | Wt 256.6 lb

## 2020-11-29 DIAGNOSIS — Z79899 Other long term (current) drug therapy: Secondary | ICD-10-CM | POA: Insufficient documentation

## 2020-11-29 DIAGNOSIS — N903 Dysplasia of vulva, unspecified: Secondary | ICD-10-CM | POA: Insufficient documentation

## 2020-11-29 DIAGNOSIS — N809 Endometriosis, unspecified: Secondary | ICD-10-CM | POA: Diagnosis not present

## 2020-11-29 DIAGNOSIS — D071 Carcinoma in situ of vulva: Secondary | ICD-10-CM

## 2020-11-29 MED ORDER — ONDANSETRON HCL 8 MG PO TABS: 8.0000 mg | ORAL_TABLET | Freq: Three times a day (TID) | ORAL | 0 refills | Status: DC | PRN
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
## 2020-12-04 ENCOUNTER — Encounter (INDEPENDENT_AMBULATORY_CARE_PROVIDER_SITE_OTHER): Payer: PPO | Admitting: Nurse Practitioner

## 2020-12-04 ENCOUNTER — Encounter: Payer: Self-pay | Admitting: Nurse Practitioner

## 2020-12-06 ENCOUNTER — Other Ambulatory Visit: Payer: Self-pay | Admitting: Nurse Practitioner

## 2020-12-07 ENCOUNTER — Telehealth: Payer: Self-pay | Admitting: *Deleted

## 2020-12-07 ENCOUNTER — Encounter: Payer: Self-pay | Admitting: Nurse Practitioner

## 2020-12-07 NOTE — Telephone Encounter (Signed)
Patient called and concerned that she did not have any calls back from triage. Pt called to report possible reacation to the vaginal cream. She stated that her lymph nodes are swollen on the left side of neck. She has read side effects listed that the vaginal cream could cause lymphadenopathy. The enlarged lymph nodes are now causing her to have a bad headache and nausea.   I contacted Ander Purpura, NP. Per lauren "unlikely related to cream but can hold cream. Would recommend er or uc to get evaluated if she doesn't feel like she can wait till tomorrow. I'm in [place] if she wants to come there or she can see Josh here.  This is a vulvar cream so unlikely causing this but it's perfectly fine to hold. Not sure what's happening with the neck though but it should be looked at."  Instructed patient of the above instructions. She prefers to go to [facility]. She is unable to get to [facility] until 1130 due to her apts with her daughter. She will be in [place] location tomorrow anyways, so she prefers to come to [facility] at 1130 am. Ander Purpura, NP agreeable to see patient in [place] tomorrow. Pt understands that if symptoms become worse, she needs to go to the ER.

## 2020-12-07 NOTE — Telephone Encounter (Signed)
Patient called to report a possible reaction to vaginal cream. She reports that the left side of her neck is swollen since starting the cream.

## 2020-12-08 ENCOUNTER — Ambulatory Visit: Payer: PPO | Admitting: Nurse Practitioner

## 2020-12-08 DIAGNOSIS — E538 Deficiency of other specified B group vitamins: Secondary | ICD-10-CM | POA: Diagnosis not present

## 2020-12-08 DIAGNOSIS — B349 Viral infection, unspecified: Secondary | ICD-10-CM | POA: Diagnosis not present

## 2020-12-08 DIAGNOSIS — U071 COVID-19: Secondary | ICD-10-CM | POA: Diagnosis not present

## 2020-12-11 ENCOUNTER — Encounter (INDEPENDENT_AMBULATORY_CARE_PROVIDER_SITE_OTHER): Payer: PPO | Admitting: Vascular Surgery

## 2020-12-12 ENCOUNTER — Telehealth: Payer: Self-pay | Admitting: Obstetrics and Gynecology

## 2020-12-12 NOTE — Telephone Encounter (Signed)
Patient called and stated that she was out her cream (Aldara) and would like a refill as soon as possible.   Thank you

## 2020-12-17 DIAGNOSIS — M7062 Trochanteric bursitis, left hip: Secondary | ICD-10-CM | POA: Diagnosis not present

## 2020-12-27 DIAGNOSIS — L732 Hidradenitis suppurativa: Secondary | ICD-10-CM | POA: Diagnosis not present

## 2020-12-27 DIAGNOSIS — L2389 Allergic contact dermatitis due to other agents: Secondary | ICD-10-CM | POA: Diagnosis not present

## 2020-12-27 DIAGNOSIS — D485 Neoplasm of uncertain behavior of skin: Secondary | ICD-10-CM | POA: Diagnosis not present

## 2020-12-27 DIAGNOSIS — L089 Local infection of the skin and subcutaneous tissue, unspecified: Secondary | ICD-10-CM | POA: Diagnosis not present

## 2021-01-01 ENCOUNTER — Encounter (INDEPENDENT_AMBULATORY_CARE_PROVIDER_SITE_OTHER): Payer: PPO | Admitting: Vascular Surgery

## 2021-01-02 DIAGNOSIS — M7062 Trochanteric bursitis, left hip: Secondary | ICD-10-CM | POA: Diagnosis not present

## 2021-01-03 ENCOUNTER — Encounter: Payer: Self-pay | Admitting: Nurse Practitioner

## 2021-01-08 DIAGNOSIS — M7062 Trochanteric bursitis, left hip: Secondary | ICD-10-CM | POA: Diagnosis not present

## 2021-01-10 ENCOUNTER — Inpatient Hospital Stay: Payer: PPO | Attending: Obstetrics and Gynecology | Admitting: Obstetrics and Gynecology

## 2021-01-10 ENCOUNTER — Inpatient Hospital Stay: Payer: PPO

## 2021-01-10 VITALS — BP 115/63 | HR 85 | Temp 97.8°F | Resp 20 | Wt 255.7 lb

## 2021-01-10 DIAGNOSIS — Z79899 Other long term (current) drug therapy: Secondary | ICD-10-CM | POA: Diagnosis not present

## 2021-01-10 DIAGNOSIS — D071 Carcinoma in situ of vulva: Secondary | ICD-10-CM

## 2021-01-10 DIAGNOSIS — N903 Dysplasia of vulva, unspecified: Secondary | ICD-10-CM | POA: Insufficient documentation

## 2021-01-10 DIAGNOSIS — Z23 Encounter for immunization: Secondary | ICD-10-CM | POA: Diagnosis not present

## 2021-01-10 DIAGNOSIS — Z87891 Personal history of nicotine dependence: Secondary | ICD-10-CM | POA: Insufficient documentation

## 2021-01-10 DIAGNOSIS — E538 Deficiency of other specified B group vitamins: Secondary | ICD-10-CM | POA: Diagnosis not present
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
## 2021-01-31 ENCOUNTER — Ambulatory Visit: Payer: PPO | Admitting: Psychiatry

## 2021-02-12 ENCOUNTER — Encounter (INDEPENDENT_AMBULATORY_CARE_PROVIDER_SITE_OTHER): Payer: PPO | Admitting: Vascular Surgery

## 2021-02-14 ENCOUNTER — Ambulatory Visit (INDEPENDENT_AMBULATORY_CARE_PROVIDER_SITE_OTHER): Payer: Medicare Other | Admitting: Licensed Clinical Social Worker

## 2021-02-14 ENCOUNTER — Encounter: Payer: Self-pay | Admitting: Orthopaedic Surgery

## 2021-02-14 ENCOUNTER — Other Ambulatory Visit: Payer: Self-pay

## 2021-02-14 DIAGNOSIS — F319 Bipolar disorder, unspecified: Secondary | ICD-10-CM

## 2021-02-14 DIAGNOSIS — F4310 Post-traumatic stress disorder, unspecified: Secondary | ICD-10-CM

## 2021-02-14 NOTE — Plan of Care (Signed)
Developed goals/interventions with pt input    Problem: Alleviate depressive/manic symptoms and return to improved levels of effective functioning. Goal: LTG: Stabilize mood and increase goal-directed behavior: Input needed on appropriate metric--pt self report Outcome: Not Met (add Reason) Goal: STG: Susan Tanner WILL ATTEND AT LEAST 80% OF SCHEDULED FOLLOW-UP OPT  APPOINTMENTS Outcome: Not Met (add Reason) Intervention: Encourage patient to identify triggers Intervention: REVIEW PLEASE SKILLS (TREAT PHYSICAL ILLNESS, BALANCE EATING, AVOID MOOD-ALTERING SUBSTANCES, BALANCE SLEEP AND GET EXERCISE) WITH Susan Tanner   Problem: Reduce the negative impact trauma related symptoms have on social, occupational, and family functioning. Goal: LTG: Reduce frequency, intensity, and duration of PTSD symptoms so daily functioning is improved: Input needed on appropriate metric.  pt self report Outcome: Not Met (add Reason) Goal: STG: Practice interpersonal effectiveness skills 7 times per week for the next 14 weeks Outcome: Not Met (add Reason) Intervention: Assist with relaxation techniques, as appropriate (deep breathing exercises, meditation, guided imagery)
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
## 2021-02-22 ENCOUNTER — Telehealth (INDEPENDENT_AMBULATORY_CARE_PROVIDER_SITE_OTHER): Payer: Medicare Other | Admitting: Psychiatry

## 2021-02-22 ENCOUNTER — Encounter: Payer: Self-pay | Admitting: Psychiatry

## 2021-02-22 ENCOUNTER — Other Ambulatory Visit: Payer: Self-pay

## 2021-02-22 DIAGNOSIS — G4700 Insomnia, unspecified: Secondary | ICD-10-CM

## 2021-02-22 DIAGNOSIS — F4310 Post-traumatic stress disorder, unspecified: Secondary | ICD-10-CM | POA: Diagnosis not present

## 2021-02-22 DIAGNOSIS — F259 Schizoaffective disorder, unspecified: Secondary | ICD-10-CM

## 2021-02-22 MED ORDER — ARIPIPRAZOLE 10 MG PO TABS: ORAL_TABLET | ORAL | 0 refills | Status: DC
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
## 2021-02-22 NOTE — Patient Instructions (Addendum)
Continue citalopram 40 mg daily Continue lithium 450 mg twice a day Obtain labs (lithium, TSH, BMP) Decrease quetiapine 100 mg at night for 1 week, then discontinue Start Abilify 5 mg at night for 1 week, then increase up to 10 mg daily  Continue hydroxyzine 25 mg as needed for anxiety Continue trazodone 50 mg at night as needed for insomnia Next appointment- 11/29 at 10:30, video

## 2021-02-23 ENCOUNTER — Ambulatory Visit: Payer: Medicare Other | Admitting: Obstetrics and Gynecology

## 2021-02-26 ENCOUNTER — Encounter (INDEPENDENT_AMBULATORY_CARE_PROVIDER_SITE_OTHER): Payer: Self-pay | Admitting: Vascular Surgery

## 2021-02-27 ENCOUNTER — Telehealth: Payer: Self-pay | Admitting: Psychiatry

## 2021-02-27 LAB — LITHIUM LEVEL: Lithium Lvl: 1.1 mmol/L (ref 0.5–1.2)

## 2021-02-27 LAB — TSH: TSH: 1.02 u[IU]/mL (ref 0.450–4.500)

## 2021-02-27 NOTE — Telephone Encounter (Signed)
Reviewed lithium level-reported on 02/27/2021-lithium-1.1-borderline high although within the therapeutic range. Patient is currently on lisinopril which likely could be contributing to elevated levels of lithium. Attempted to contact patient to discuss however unable to reach.  We will have Susan Tanner CMA contact this patient to assess for any symptoms of lithium toxicity-and if she does not have any adverse side effects at this time, she could wait and discuss with Dr. Modesta Messing when she returns in office.  Her TSH-within normal limits.  We will route this message also to Dr. Modesta Messing.

## 2021-02-28 ENCOUNTER — Telehealth: Payer: Self-pay

## 2021-02-28 DIAGNOSIS — Z79899 Other long term (current) drug therapy: Secondary | ICD-10-CM | POA: Insufficient documentation

## 2021-02-28 DIAGNOSIS — F259 Schizoaffective disorder, unspecified: Secondary | ICD-10-CM

## 2021-02-28 NOTE — Telephone Encounter (Signed)
spoke with patient about the lithium levels.  she states that she has been feeling hot all the time, and some nausea feeling and she also noticed that she having alot of bruising and she not sure if that is a adverse side effect.

## 2021-02-28 NOTE — Telephone Encounter (Signed)
Contacted patient to discuss her current symptoms.  Patient reports she has been having nausea, right-sided pain , however this has been going on since the past few weeks, unknown if this is due to her lithium level being high or not.  Patient also reports she may not have gotten her lithium levels as she was supposed to in the morning.  Hence we will order another lithium level, she agrees to go to lab tomorrow morning before taking her morning dose.  Patient advised to go to the nearest urgent care or go to her primary care provider for evaluation of her current physical symptoms.  Provided education about symptoms of lithium toxicity including nausea, vomiting, agitation, confusion, irritability, tremors.  She will monitor herself.  Patient advised to call us back if she has any concerns.  Patient wants to go to [facility] tomorrow morning to get repeat labs.  Will put the order in.

## 2021-03-01 ENCOUNTER — Telehealth: Payer: Self-pay | Admitting: Psychiatry

## 2021-03-01 ENCOUNTER — Other Ambulatory Visit: Payer: Self-pay | Admitting: Psychiatry

## 2021-03-01 DIAGNOSIS — F259 Schizoaffective disorder, unspecified: Secondary | ICD-10-CM

## 2021-03-01 DIAGNOSIS — Z79899 Other long term (current) drug therapy: Secondary | ICD-10-CM

## 2021-03-01 NOTE — Telephone Encounter (Signed)
Contacted patient, to remind her about lithium level.  BMP still pending.  Will order again.  Patient to go back to the lab to get this completed.

## 2021-03-02 LAB — LITHIUM LEVEL: Lithium Lvl: 0.8 mmol/L (ref 0.5–1.2)

## 2021-03-02 LAB — BASIC METABOLIC PANEL
BUN/Creatinine Ratio: 11 (ref 9–23)
BUN: 8 mg/dL (ref 6–20)
CO2: 23 mmol/L (ref 20–29)
Calcium: 9.9 mg/dL (ref 8.7–10.2)
Chloride: 102 mmol/L (ref 96–106)
Creatinine, Ser: 0.7 mg/dL (ref 0.57–1.00)
Glucose: 121 mg/dL — ABNORMAL HIGH (ref 70–99)
Potassium: 4.3 mmol/L (ref 3.5–5.2)
Sodium: 138 mmol/L (ref 134–144)
eGFR: 113 mL/min/{1.73_m2} (ref >59)

## 2021-03-02 NOTE — Telephone Encounter (Signed)
Returned call to patient, discussed lithium level-therapeutic at 0.8.  Patient to follow up with primary psychiatrist for further medication management as needed.

## 2021-03-05 ENCOUNTER — Telehealth: Payer: Self-pay

## 2021-03-05 NOTE — Telephone Encounter (Signed)
Pt states that she has been manic since her medication change. She states she was taken off Seraquil and put on Abilify. She says she has been unable to sleep at all with the exception of maybe twice since the change. Best Number to reach her [phone]. Preferred pharmacy [facility] in [place].
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
## 2021-03-05 NOTE — Telephone Encounter (Signed)
Dr.hisada's patient

## 2021-03-07 DIAGNOSIS — I8310 Varicose veins of unspecified lower extremity with inflammation: Secondary | ICD-10-CM | POA: Insufficient documentation

## 2021-03-07 NOTE — Progress Notes (Signed)
MRN : [id number]  Susan Tanner is a 40 y.o. (07/29/1980) female who presents with chief complaint of check my varicose veins.  History of Present Illness:   The patient is seen for evaluation of asymptomatic varicose veins. The patient denies burning and stinging with standing. The patient denies aching and throbbing pain over the varicosities, particularly with prolonged dependent positions.   There is no history of DVT, PE or superficial thrombophlebitis. There is no history of ulceration or hemorrhage. The patient denies a significant family history of varicose veins.  The patient has not worn graduated compression in the past. At the present time the patient has not been using over-the-counter analgesics. There is no history of prior surgical intervention or sclerotherapy.    No outpatient medications have been marked as taking for the 03/08/21 encounter (Appointment) with Delana Meyer, Dolores Lory, MD.    Past Medical History:  Diagnosis Date   Anxiety    Asthma    Bipolar 1 disorder ([place])    Depression    Diabetes mellitus without complication ([employer])    Endometriosis    Heart abnormality     Past Surgical History:  Procedure Laterality Date   ABDOMINAL HYSTERECTOMY     APPENDECTOMY     TUBAL LIGATION      Social History Social History   Tobacco Use   Smoking status: Never   Smokeless tobacco: Never  Vaping Use   Vaping Use: Never used  Substance Use Topics   Alcohol use: No   Drug use: No    Family History Family History  Problem Relation Age of Onset   Bipolar disorder Brother    Drug abuse Brother    Schizophrenia Maternal Uncle    Diabetes Maternal Grandfather    Diabetes Paternal Grandfather    Cancer Cousin    Brain cancer Cousin     Allergies  Allergen Reactions   Depakote Er [Divalproex Sodium Er] Other (See Comments)    Affects liver enzymes   Cefuroxime Axetil Other (See Comments)   Loratadine-Pseudoephedrine Er     Muscle pain. Other  reaction(s): Muscle Pain   Oxycodone-Acetaminophen Itching   Percocet [Oxycodone-Acetaminophen] Itching   Valproic Acid     Other reaction(s): Liver Disorder "affects liver" Other reaction(s): Liver Disorder    Ziprasidone Hcl Other (See Comments)    Inflames her liver, elevated LFTs Other reaction(s): Liver Disorder   Cariprazine Palpitations   Lamictal [Lamotrigine] Rash   Tape Rash     REVIEW OF SYSTEMS (Negative unless checked)  Constitutional: [] Weight loss  [] Fever  [] Chills Cardiac: [] Chest pain   [] Chest pressure   [] Palpitations   [] Shortness of breath when laying flat   [] Shortness of breath with exertion. Vascular:  [] Pain in legs with walking   [] Pain in legs at rest  [] History of DVT   [] Phlebitis   [x] Swelling in legs   [] Varicose veins   [] Non-healing ulcers Pulmonary:   [] Uses home oxygen   [] Productive cough   [] Hemoptysis   [] Wheeze  [] COPD   [] Asthma Neurologic:  [] Dizziness   [] Seizures   [] History of stroke   [] History of TIA  [] Aphasia   [] Vissual changes   [] Weakness or numbness in arm   [] Weakness or numbness in leg Musculoskeletal:   [] Joint swelling   [] Joint pain   [] Low back pain Hematologic:  [] Easy bruising  [] Easy bleeding   [] Hypercoagulable state   [] Anemic Gastrointestinal:  [] Diarrhea   [] Vomiting  [x] Gastroesophageal reflux/heartburn   [] Difficulty swallowing. Genitourinary:  []   Chronic kidney disease   [] Difficult urination  [] Frequent urination   [] Blood in urine Skin:  [] Rashes   [] Ulcers  Psychological:  [] History of anxiety   []  History of major depression.  Physical Examination  There were no vitals filed for this visit. There is no height or weight on file to calculate BMI. Gen: WD/WN, NAD Head: [place]/AT, No temporalis wasting.  Ear/Nose/Throat: Hearing grossly intact, nares w/o erythema or drainage, pinna without lesions Eyes: PER, EOMI, sclera nonicteric.  Neck: Supple, no gross masses.  No JVD.  Pulmonary:  Good air movement, no  audible wheezing, no use of accessory muscles.  Cardiac: RRR, precordium not hyperdynamic. Vascular:  scattered varicosities present bilaterally.  Mild venous stasis changes to the legs bilaterally.  trace soft pitting edema  Vessel Right Left  Radial Palpable Palpable  Gastrointestinal: soft, non-distended. No guarding/no peritoneal signs.  Musculoskeletal: M/S 5/5 throughout.  No deformity.  Neurologic: CN 2-12 intact. Pain and light touch intact in extremities.  Symmetrical.  Speech is fluent. Motor exam as listed above. Psychiatric: Judgment intact, Mood & affect appropriate for pt's clinical situation. Dermatologic: Venous rashes no ulcers noted.  No changes consistent with cellulitis. Lymph : No lichenification or skin changes of chronic lymphedema.  CBC Lab Results  Component Value Date   WBC 13.2 (H) 07/01/2018   HGB 11.8 (L) 07/01/2018   HCT 36.1 07/01/2018   MCV 85.3 07/01/2018   PLT 297 07/01/2018    BMET    Component Value Date/Time   NA 138 03/01/2021 0938   NA 140 11/25/2013 1750   K 4.3 03/01/2021 0938   K 3.7 11/25/2013 1750   CL 102 03/01/2021 0938   CL 107 11/25/2013 1750   CO2 23 03/01/2021 0938   CO2 23 11/25/2013 1750   GLUCOSE 121 (H) 03/01/2021 0938   GLUCOSE 168 (H) 07/01/2018 0856   GLUCOSE 97 11/25/2013 1750   BUN 8 03/01/2021 0938   BUN 8 11/25/2013 1750   CREATININE 0.70 03/01/2021 0938   CREATININE 0.91 11/25/2013 1750   CALCIUM 9.9 03/01/2021 0938   CALCIUM 9.0 11/25/2013 1750   GFRNONAA >60 07/01/2018 0856   GFRNONAA >60 11/25/2013 1750   GFRAA >60 07/01/2018 0856   GFRAA >60 11/25/2013 1750   CrCl cannot be calculated (Unknown ideal weight.).  COAG No results found for: INR, PROTIME  Radiology No results found.   Assessment/Plan 1. Varicose veins with inflammation Recommend:  The patient is complaining of varicose veins.    I have had a long discussion with the patient regarding  varicose veins and why they cause symptoms.   Patient will begin wearing graduated compression stockings on a daily basis, beginning first thing in the morning and removing them in the evening. The patient is instructed specifically not to sleep in the stockings.    The patient  will also begin using over-the-counter analgesics such as Motrin 600 mg po TID to help control the symptoms as needed.    In addition, behavioral modification including elevation during the day will be initiated, utilizing a recliner was recommended.  The patient is also instructed to continue exercising such as walking 4-5 times per week.  At this time the patient wishes to continue conservative therapy and is not interested in more invasive treatments such as laser ablation and sclerotherapy.  The Patient will follow up PRN if the symptoms worsen.   2. Type 2 diabetes mellitus without complication, unspecified whether long term insulin use ([place]) Continue hypoglycemic medications as  already ordered, these medications have been reviewed and there are no changes at this time.  Hgb A1C to be monitored as already arranged by primary service   3. Gastroesophageal reflux disease without esophagitis Continue PPI as already ordered, this medication has been reviewed and there are no changes at this time.  Avoidence of caffeine and alcohol  Moderate elevation of the head of the bed    4. Mixed hyperlipidemia Continue statin as ordered and reviewed, no changes at this time     Hortencia Pilar, MD  03/07/2021 11:22 AM

## 2021-03-08 ENCOUNTER — Other Ambulatory Visit: Payer: Self-pay

## 2021-03-08 ENCOUNTER — Ambulatory Visit (INDEPENDENT_AMBULATORY_CARE_PROVIDER_SITE_OTHER): Payer: Medicare Other | Admitting: Vascular Surgery

## 2021-03-08 VITALS — BP 122/79 | HR 87 | Ht 65.0 in | Wt 257.0 lb

## 2021-03-08 DIAGNOSIS — E119 Type 2 diabetes mellitus without complications: Secondary | ICD-10-CM

## 2021-03-08 DIAGNOSIS — K219 Gastro-esophageal reflux disease without esophagitis: Secondary | ICD-10-CM

## 2021-03-08 DIAGNOSIS — I8310 Varicose veins of unspecified lower extremity with inflammation: Secondary | ICD-10-CM | POA: Diagnosis not present

## 2021-03-08 DIAGNOSIS — E782 Mixed hyperlipidemia: Secondary | ICD-10-CM
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
## 2021-03-11 ENCOUNTER — Encounter (INDEPENDENT_AMBULATORY_CARE_PROVIDER_SITE_OTHER): Payer: Self-pay | Admitting: Vascular Surgery

## 2021-03-13 ENCOUNTER — Telehealth (INDEPENDENT_AMBULATORY_CARE_PROVIDER_SITE_OTHER): Payer: Medicare Other | Admitting: Psychiatry

## 2021-03-13 ENCOUNTER — Other Ambulatory Visit: Payer: Self-pay

## 2021-03-13 ENCOUNTER — Encounter: Payer: Self-pay | Admitting: Psychiatry

## 2021-03-13 DIAGNOSIS — F259 Schizoaffective disorder, unspecified: Secondary | ICD-10-CM

## 2021-03-13 DIAGNOSIS — F4310 Post-traumatic stress disorder, unspecified: Secondary | ICD-10-CM | POA: Diagnosis not present

## 2021-03-13 DIAGNOSIS — G4700 Insomnia, unspecified: Secondary | ICD-10-CM

## 2021-03-13 MED ORDER — LURASIDONE HCL 40 MG PO TABS: 40.0000 mg | ORAL_TABLET | Freq: Every day | ORAL | 0 refills | Status: DC

## 2021-03-13 MED ORDER — ZOLPIDEM TARTRATE 5 MG PO TABS: 5.0000 mg | ORAL_TABLET | Freq: Every evening | ORAL | 0 refills | Status: DC | PRN

## 2021-03-13 NOTE — Patient Instructions (Signed)
Continue citalopram 40 mg daily  Continue lithium 450 mg twice a day Start Latuda 40 mg daily Discontinue Abilify Continue hydroxyzine 25 mg as needed for anxiety Discontinue Trazodone Start Ambien 5 mg at night as needed for sleep Next appointment- 11/29 at 10:30

## 2021-03-14 ENCOUNTER — Inpatient Hospital Stay: Payer: Medicare Other | Attending: Obstetrics and Gynecology | Admitting: Obstetrics and Gynecology

## 2021-03-14 ENCOUNTER — Other Ambulatory Visit: Payer: Self-pay

## 2021-03-14 VITALS — BP 141/94 | HR 78 | Temp 97.8°F | Resp 20 | Wt 253.3 lb

## 2021-03-14 DIAGNOSIS — Z6841 Body Mass Index (BMI) 40.0 and over, adult: Secondary | ICD-10-CM | POA: Diagnosis not present

## 2021-03-14 DIAGNOSIS — G8929 Other chronic pain: Secondary | ICD-10-CM | POA: Insufficient documentation

## 2021-03-14 DIAGNOSIS — F259 Schizoaffective disorder, unspecified: Secondary | ICD-10-CM | POA: Insufficient documentation

## 2021-03-14 DIAGNOSIS — G4733 Obstructive sleep apnea (adult) (pediatric): Secondary | ICD-10-CM | POA: Diagnosis not present

## 2021-03-14 DIAGNOSIS — Z791 Long term (current) use of non-steroidal anti-inflammatories (NSAID): Secondary | ICD-10-CM | POA: Diagnosis not present

## 2021-03-14 DIAGNOSIS — K219 Gastro-esophageal reflux disease without esophagitis: Secondary | ICD-10-CM | POA: Diagnosis not present

## 2021-03-14 DIAGNOSIS — F4310 Post-traumatic stress disorder, unspecified: Secondary | ICD-10-CM | POA: Insufficient documentation

## 2021-03-14 DIAGNOSIS — Z7984 Long term (current) use of oral hypoglycemic drugs: Secondary | ICD-10-CM | POA: Insufficient documentation

## 2021-03-14 DIAGNOSIS — E119 Type 2 diabetes mellitus without complications: Secondary | ICD-10-CM | POA: Insufficient documentation

## 2021-03-14 DIAGNOSIS — N809 Endometriosis, unspecified: Secondary | ICD-10-CM | POA: Insufficient documentation

## 2021-03-14 DIAGNOSIS — F319 Bipolar disorder, unspecified: Secondary | ICD-10-CM | POA: Insufficient documentation

## 2021-03-14 DIAGNOSIS — D071 Carcinoma in situ of vulva: Secondary | ICD-10-CM | POA: Diagnosis present

## 2021-03-14 DIAGNOSIS — E782 Mixed hyperlipidemia: Secondary | ICD-10-CM | POA: Insufficient documentation

## 2021-03-14 DIAGNOSIS — Z79899 Other long term (current) drug therapy: Secondary | ICD-10-CM | POA: Insufficient documentation

## 2021-03-14 DIAGNOSIS — I493 Ventricular premature depolarization: Secondary | ICD-10-CM | POA: Insufficient documentation

## 2021-03-14 DIAGNOSIS — I420 Dilated cardiomyopathy: Secondary | ICD-10-CM | POA: Diagnosis not present

## 2021-03-14 DIAGNOSIS — Z90710 Acquired absence of both cervix and uterus: Secondary | ICD-10-CM | POA: Insufficient documentation
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
## 2021-03-19 ENCOUNTER — Ambulatory Visit (INDEPENDENT_AMBULATORY_CARE_PROVIDER_SITE_OTHER): Payer: Medicare Other | Admitting: Obstetrics and Gynecology

## 2021-03-19 ENCOUNTER — Encounter: Payer: Self-pay | Admitting: Obstetrics and Gynecology

## 2021-03-19 ENCOUNTER — Other Ambulatory Visit: Payer: Self-pay

## 2021-03-19 VITALS — BP 124/72 | Ht 65.0 in | Wt 261.8 lb

## 2021-03-19 DIAGNOSIS — K5901 Slow transit constipation: Secondary | ICD-10-CM

## 2021-03-19 MED ORDER — DOCUSATE SODIUM 100 MG PO CAPS: 100.0000 mg | ORAL_CAPSULE | Freq: Two times a day (BID) | ORAL | 0 refills | Status: DC

## 2021-03-19 NOTE — Progress Notes (Signed)
Patient ID: Susan Tanner, female   DOB: 04/04/1981, 40 y.o.   MRN: [id number]  Reason for Consult: Gynecologic Exam   Referred by Sallee Lange, *  Subjective:     HPI:  Susan Tanner is a 40 y.o.  female. 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   Gynecological History  No LMP recorded. Patient has had a hysterectomy.  Past Medical History:  Diagnosis Date   Anxiety    Asthma    Bipolar 1 disorder ([place])    Depression    Diabetes mellitus without complication ([place])    Endometriosis    Heart abnormality    Family History  Problem Relation Age of Onset   Bipolar disorder Brother    Drug abuse Brother    Schizophrenia Maternal Uncle    Diabetes Maternal Grandfather    Diabetes Paternal Grandfather    Cancer Cousin    Brain cancer Cousin    Past Surgical History:  Procedure Laterality Date   ABDOMINAL HYSTERECTOMY     APPENDECTOMY     TUBAL LIGATION      Short Social History:  Social History   Tobacco Use   Smoking status: Never   Smokeless tobacco: Never  Substance Use Topics   Alcohol use: No    Allergies  Allergen Reactions   Depakote Er [Divalproex Sodium Er] Other (See Comments)    Affects liver enzymes   Cefuroxime Axetil Other (See Comments)   Loratadine-Pseudoephedrine Er     Muscle pain. Other reaction(s): Muscle Pain   Oxycodone-Acetaminophen Itching   Percocet [Oxycodone-Acetaminophen] Itching   Valproic Acid     Other reaction(s): Liver Disorder "affects liver" Other reaction(s): Liver Disorder    Ziprasidone Hcl Other (See Comments)    Inflames her liver, elevated LFTs Other reaction(s): Liver Disorder   Cariprazine Palpitations   Lamictal [Lamotrigine] Rash   Tape Rash    Current Outpatient Medications  Medication Sig Dispense Refill   albuterol (PROVENTIL HFA;VENTOLIN HFA) 108 (90 Base) MCG/ACT inhaler Inhale 2 puffs into the lungs every 6 (six) hours as needed for wheezing or  shortness of breath. Patient can only tolerate ventolin inhaler 1 Inhaler 0   ALPRAZolam (XANAX) 0.5 MG tablet Take by mouth daily as needed.     citalopram (CELEXA) 10 MG tablet Take 40 mg by mouth daily.     clindamycin (CLINDAGEL) 1 % gel Apply to affected area 2 times daily 30 g 0   cyanocobalamin (,VITAMIN B-12,) 1000 MCG/ML injection Inject into the muscle.     glucose blood test strip OneTouch Ultra Test strips     HYDROcodone-acetaminophen (NORCO/VICODIN) 5-325 MG tablet hydrocodone 5 mg-acetaminophen 325 mg tablet     hydrOXYzine (VISTARIL) 25 MG capsule hydroxyzine pamoate 25 mg capsule     lisinopril (ZESTRIL) 10 MG tablet lisinopril 10 mg tablet     lithium carbonate (ESKALITH) 450 MG CR tablet Take by mouth 2 (two) times daily. pt takes 1 in the morning and 2 at night     lurasidone (LATUDA) 40 MG TABS tablet Take 1 tablet (40 mg total) by mouth daily with breakfast. 30 tablet 0   metFORMIN (GLUCOPHAGE) 500 MG tablet metformin 500 mg tablet  TAKE 2 TABLETS BY MOUTH TWICE A DAY WITH MEALS     ondansetron (ZOFRAN) 8 MG tablet Take 1 tablet (8 mg total) by mouth every 8 (eight) hours as needed for nausea. 90 tablet 0   pantoprazole (PROTONIX) 40  MG tablet Take 40 mg by mouth daily.     rosuvastatin (CRESTOR) 5 MG tablet Take 5 mg by mouth daily.     zolpidem (AMBIEN) 5 MG tablet Take 1 tablet (5 mg total) by mouth at bedtime as needed for sleep. 30 tablet 0   imiquimod (ALDARA) 5 % cream APPLY TOPICALLY 3 TIMES A WEEK. (Patient not taking: Reported on 03/14/2021) 24 each 1   meloxicam (MOBIC) 15 MG tablet Take 15 mg by mouth daily.     methocarbamol (ROBAXIN) 500 MG tablet methocarbamol 500 mg tablet  TAKE 1 TABLET BY MOUTH 4 TIMES DAILY FOR 10 DAYS.     No current facility-administered medications for this visit.    Review of Systems  Constitutional: Negative for chills, fatigue, fever and unexpected weight change.  HENT: Negative for trouble swallowing.  Eyes: Negative for  loss of vision.  Respiratory: Negative for cough, shortness of breath and wheezing.  Cardiovascular: Negative for chest pain, leg swelling, palpitations and syncope.  GI: Negative for abdominal pain, blood in stool, diarrhea, nausea and vomiting.  GU: Negative for difficulty urinating, dysuria, frequency and hematuria.  Musculoskeletal: Negative for back pain, leg pain and joint pain.  Skin: Negative for rash.  Neurological: Negative for dizziness, headaches, light-headedness, numbness and seizures.  Psychiatric: Negative for behavioral problem, confusion, depressed mood and sleep disturbance.       Objective:  Objective   Vitals:   03/19/21 1402  BP: 124/72  Weight: 261 lb 12.8 oz (118.8 kg)  Height: 5\' 5"  (1.651 m)   Body mass index is 43.57 kg/m.  Physical Exam Vitals and nursing note reviewed. Exam conducted with a chaperone present.  Constitutional:      Appearance: Normal appearance.  HENT:     Head: Normocephalic and atraumatic.  Eyes:     Extraocular Movements: Extraocular movements intact.     Pupils: Pupils are equal, round, and reactive to light.  Cardiovascular:     Rate and Rhythm: Normal rate and regular rhythm.  Pulmonary:     Effort: Pulmonary effort is normal.     Breath sounds: Normal breath sounds.  Abdominal:     General: Abdomen is flat.     Palpations: Abdomen is soft.  Musculoskeletal:     Cervical back: Normal range of motion.  Skin:    General: Skin is warm and dry.  Neurological:     General: No focal deficit present.     Mental Status: She is alert and oriented to person, place, and time.  Psychiatric:        Behavior: Behavior normal.        Thought Content: Thought content normal.        Judgment: Judgment normal.    Assessment/Plan:     40 yo  Stool softener ordered Continue follow up with GYN ONC for care of high grade VIN  More than 5 minutes were spent face to face with the patient in the room, reviewing the medical record,  labs and images, and coordinating care for the patient. The plan of management was discussed in detail and counseling was provided.    Adrian Prows MD [facility], [facility] 03/19/2021 2:22 PM

## 2021-03-20 ENCOUNTER — Telehealth: Payer: Self-pay

## 2021-03-20 ENCOUNTER — Telehealth (INDEPENDENT_AMBULATORY_CARE_PROVIDER_SITE_OTHER): Payer: Medicare Other | Admitting: Psychiatry

## 2021-03-20 ENCOUNTER — Encounter: Payer: Self-pay | Admitting: Psychiatry

## 2021-03-20 DIAGNOSIS — F259 Schizoaffective disorder, unspecified: Secondary | ICD-10-CM

## 2021-03-20 DIAGNOSIS — G4700 Insomnia, unspecified: Secondary | ICD-10-CM

## 2021-03-20 DIAGNOSIS — F4310 Post-traumatic stress disorder, unspecified: Secondary | ICD-10-CM

## 2021-03-20 LAB — IGP, APTIMA HPV: HPV Aptima: NEGATIVE

## 2021-03-20 LAB — SURGICAL PATHOLOGY

## 2021-03-20 MED ORDER — LURASIDONE HCL 40 MG PO TABS: 40.0000 mg | ORAL_TABLET | Freq: Every day | ORAL | 0 refills | Status: DC

## 2021-03-20 MED ORDER — ZOLPIDEM TARTRATE 5 MG PO TABS: 5.0000 mg | ORAL_TABLET | Freq: Every evening | ORAL | 0 refills | Status: DC | PRN

## 2021-03-20 NOTE — Telephone Encounter (Signed)
As advised by NP, called patient informed that pathology report states no dysphasia and we are still waiting pap swear result. Informed patient will call with pap results. Patient verbalizes understanding and aware of next appointment. No further questions.

## 2021-03-20 NOTE — Patient Instructions (Addendum)
Continue citalopram 40 mg daily  Continue lithium 450 mg twice a day Continue Latuda 40 mg daily Continue hydroxyzine 25 mg as needed for anxiety Continue Ambien 5 mg at night as needed for sleep Next appointment- 1/12 at 10:30  The next visit will be in person visit. Please arrive 15 mins before the scheduled time.   [facility]  Address: [address], [place], [place] [address]

## 2021-03-26 ENCOUNTER — Encounter: Payer: Self-pay | Admitting: Obstetrics and Gynecology

## 2021-03-26 ENCOUNTER — Other Ambulatory Visit: Payer: Self-pay | Admitting: Obstetrics and Gynecology

## 2021-03-26 DIAGNOSIS — B3731 Acute candidiasis of vulva and vagina: Secondary | ICD-10-CM

## 2021-03-26 MED ORDER — FLUCONAZOLE 150 MG PO TABS: 150.0000 mg | ORAL_TABLET | ORAL | 0 refills | Status: AC

## 2021-03-26 NOTE — Progress Notes (Signed)
Diflucan rx sent

## 2021-03-28 ENCOUNTER — Ambulatory Visit: Payer: Medicare Other | Admitting: Licensed Clinical Social Worker

## 2021-04-04 ENCOUNTER — Ambulatory Visit: Payer: PPO

## 2021-04-05 ENCOUNTER — Other Ambulatory Visit: Payer: Self-pay | Admitting: Psychiatry

## 2021-04-05 ENCOUNTER — Telehealth: Payer: Self-pay | Admitting: Psychiatry

## 2021-04-05 ENCOUNTER — Encounter: Payer: Self-pay | Admitting: Psychiatry

## 2021-04-05 ENCOUNTER — Ambulatory Visit: Payer: Medicare Other | Admitting: Obstetrics and Gynecology

## 2021-04-05 MED ORDER — LATUDA 60 MG PO TABS: 60.0000 mg | ORAL_TABLET | Freq: Every day | ORAL | 0 refills | Status: DC

## 2021-04-05 NOTE — Telephone Encounter (Signed)
Patient called in stating that she has thoughts of suicide and is very depressed. Wants to speak with her Dr. Hanley Susan Tanner her the information to the crisis center in [place]
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
## 2021-04-27 ENCOUNTER — Telehealth (INDEPENDENT_AMBULATORY_CARE_PROVIDER_SITE_OTHER): Payer: 59 | Admitting: Psychiatry

## 2021-04-27 ENCOUNTER — Encounter: Payer: Self-pay | Admitting: Psychiatry

## 2021-04-27 ENCOUNTER — Other Ambulatory Visit: Payer: Self-pay

## 2021-04-27 ENCOUNTER — Ambulatory Visit: Payer: Self-pay | Admitting: Obstetrics and Gynecology

## 2021-04-27 DIAGNOSIS — F4310 Post-traumatic stress disorder, unspecified: Secondary | ICD-10-CM | POA: Diagnosis not present

## 2021-04-27 DIAGNOSIS — G4700 Insomnia, unspecified: Secondary | ICD-10-CM

## 2021-04-27 DIAGNOSIS — F259 Schizoaffective disorder, unspecified: Secondary | ICD-10-CM | POA: Diagnosis not present

## 2021-04-27 MED ORDER — ZOLPIDEM TARTRATE 5 MG PO TABS: 5.0000 mg | ORAL_TABLET | Freq: Every evening | ORAL | 2 refills | Status: DC | PRN

## 2021-04-27 MED ORDER — LATUDA 60 MG PO TABS: 60.0000 mg | ORAL_TABLET | Freq: Every day | ORAL | 0 refills | Status: DC

## 2021-04-27 MED ORDER — BUPROPION HCL ER (XL) 150 MG PO TB24: 150.0000 mg | ORAL_TABLET | Freq: Every day | ORAL | 0 refills | Status: DC

## 2021-04-27 NOTE — Patient Instructions (Signed)
Continue citalopram 40 mg daily  Continue lithium 450 mg twice a day  Continue Latuda 60 mg daily Start bupropion 150 mg daily  Continue hydroxyzine 25 mg as needed for anxiety Continue Ambien 5 mg at night as needed for sleep Next appointment- 2/2 at 3:30

## 2021-05-03 ENCOUNTER — Ambulatory Visit: Payer: Medicare Other | Admitting: Psychiatry

## 2021-05-15 ENCOUNTER — Ambulatory Visit: Payer: Medicare Other | Attending: Neurology

## 2021-05-15 DIAGNOSIS — Z9989 Dependence on other enabling machines and devices: Secondary | ICD-10-CM | POA: Insufficient documentation

## 2021-05-15 DIAGNOSIS — F419 Anxiety disorder, unspecified: Secondary | ICD-10-CM | POA: Diagnosis not present

## 2021-05-15 DIAGNOSIS — F5104 Psychophysiologic insomnia: Secondary | ICD-10-CM | POA: Insufficient documentation

## 2021-05-15 DIAGNOSIS — R0683 Snoring: Secondary | ICD-10-CM | POA: Insufficient documentation

## 2021-05-15 DIAGNOSIS — I119 Hypertensive heart disease without heart failure: Secondary | ICD-10-CM | POA: Diagnosis not present

## 2021-05-15 DIAGNOSIS — Z6841 Body Mass Index (BMI) 40.0 and over, adult: Secondary | ICD-10-CM | POA: Insufficient documentation

## 2021-05-15 DIAGNOSIS — F319 Bipolar disorder, unspecified: Secondary | ICD-10-CM | POA: Insufficient documentation

## 2021-05-15 DIAGNOSIS — G4733 Obstructive sleep apnea (adult) (pediatric): Secondary | ICD-10-CM | POA: Diagnosis present

## 2021-05-16 ENCOUNTER — Ambulatory Visit (INDEPENDENT_AMBULATORY_CARE_PROVIDER_SITE_OTHER): Payer: 59 | Admitting: Licensed Clinical Social Worker

## 2021-05-16 DIAGNOSIS — F4310 Post-traumatic stress disorder, unspecified: Secondary | ICD-10-CM

## 2021-05-16 DIAGNOSIS — F259 Schizoaffective disorder, unspecified: Secondary | ICD-10-CM

## 2021-05-16 NOTE — Progress Notes (Signed)
Virtual Visit via Video Note  I connected with Susan Tanner on 05/16/21 at  3:00 PM EST by a video enabled telemedicine application and verified that I am speaking with the correct person using two identifiers.  Location: Patient: home Provider: [facility]   I discussed the limitations of evaluation and management by telemedicine and the availability of in person appointments. The patient expressed understanding and agreed to proceed.   I discussed the assessment and treatment plan with the patient. The patient was provided an opportunity to ask questions and all were answered. The patient agreed with the plan and demonstrated an understanding of the instructions.   The patient was advised to call back or seek an in-person evaluation if the symptoms worsen or if the condition fails to improve as anticipated.  I provided 35 minutes of non-face-to-face time during this encounter.   Granville Whitefield R Ardeth Repetto, LCSW   THERAPIST PROGRESS NOTE  Session Time: 3-335p  Participation Level: Active  Behavioral Response: Neat and Well GroomedAlertDepressed  Type of Therapy: Individual Therapy  Treatment Goals addressed:  Problem: Anxiety Disorder CCP Problem  1 Reduce overall frequency, intensity, and duration of the anxiety so that daily functioning is not impaired per pt self report 3 out of 5 sessions documented.    Goal: LTG-Patient's behavior demonstrates decreased anxiety Outcome: Progressing  Problem: Depression CCP Problem  1 Decrease depressive symptoms and improve levels of effective functioning-pt reports a decrease in overall depression symptoms 3 out of 5 sessions documented.   Goal: LTG: Reduce frequency, intensity, and duration of depression symptoms as evidenced by: self report 3 out of 5 sessions Outcome: Not Progressing  Goal: STG: Olamide WILL PARTICIPATE IN AT LEAST 80% OF SCHEDULED INDIVIDUAL PSYCHOTHERAPY SESSIONS Outcome: Progressing  Interventions:  Intervention: Discuss  self-management skills  Intervention: Encourage self-care activities  Intervention: Encourage compliance with prescribed medication regimen  Intervention: REVIEW PLEASE SKILLS (TREAT PHYSICAL ILLNESS, BALANCE EATING, AVOID MOOD-ALTERING SUBSTANCES, BALANCE SLEEP AND GET EXERCISE) WITH [place]  Intervention: Assist with relaxation techniques, as appropriate (deep breathing exercises, meditation, guided imagery)  Intervention: Work with patient to identify the major components of a recent episode of anxiety: physical symptoms, major thoughts and images, and major behaviors they experienced  Intervention: Educate patient on: Ways to improve sleep  Summary: Susan Tanner is a 41 y.o. female who presents with continuing symptoms related to depression.  Pt reports that she is compliant with her medication and is getting fair quality and quantity of sleep. Pt reports that she completed sleep study last night and is interested in the results.  Allowed pt to explore and express thoughts and feelings associated with recent life situations and external stressors. Pt reports that she is having continuing anxiety whenever she leaves her home and that she has felt this ways since she was in her 41's.   Reviewed several strategies to help manage depression and anxiety symptoms along with any additional environmental stressors.  Pt reflects understanding.   Continued recommendations are as follows: self care behaviors, positive social engagements, focusing on overall work/home/life balance, and focusing on positive physical and emotional wellness.    Suicidal/Homicidal: No  Therapist Response: Pt is continuing to apply interventions learned in session into daily life situations. Pt is currently on track to meet goals utilizing interventions mentioned above. Personal growth and progress is currently fluctuating. Treatment to continue as indicated.    Plan: Return again in 3 weeks.  Diagnosis: Axis I:  Schizoafffective Disorder, PTSD    Axis  II: No diagnosis    Rachel Bo Flay Ghosh, LCSW 05/16/2021

## 2021-05-17 ENCOUNTER — Encounter: Payer: Self-pay | Admitting: Licensed Clinical Social Worker

## 2021-05-17 NOTE — Plan of Care (Signed)
°  Problem: Depression CCP Problem  1 Decrease depressive symptoms and improve levels of effective functioning-pt reports a decrease in overall depression symptoms 3 out of 5 sessions documented.  Goal: LTG: Reduce frequency, intensity, and duration of depression symptoms as evidenced by: self report 3 out of 5 sessions Outcome: Not Progressing Goal: STG: [name] WILL PARTICIPATE IN AT LEAST 80% OF SCHEDULED INDIVIDUAL PSYCHOTHERAPY SESSIONS Outcome: Progressing Intervention: Discuss self-management skills Intervention: Encourage self-care activities Intervention: Encourage compliance with prescribed medication regimen Intervention: REVIEW PLEASE SKILLS (TREAT PHYSICAL ILLNESS, BALANCE EATING, AVOID MOOD-ALTERING SUBSTANCES, BALANCE SLEEP AND GET EXERCISE) WITH Susan Tanner   Problem: Anxiety Disorder CCP Problem  1 Reduce overall frequency, intensity, and duration of the anxiety so that daily functioning is not impaired per pt self report 3 out of 5 sessions documented.   Goal: LTG-Patient's behavior demonstrates decreased anxiety Outcome: Progressing Intervention: Assist with relaxation techniques, as appropriate (deep breathing exercises, meditation, guided imagery) Intervention: Work with patient to identify the major components of a recent episode of anxiety: physical symptoms, major thoughts and images, and major behaviors they experienced Intervention: Educate patient on: Ways to improve sleep

## 2021-05-18 ENCOUNTER — Other Ambulatory Visit: Payer: Self-pay

## 2021-05-19 ENCOUNTER — Other Ambulatory Visit: Payer: Self-pay | Admitting: Psychiatry
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
## 2021-05-24 ENCOUNTER — Ambulatory Visit (INDEPENDENT_AMBULATORY_CARE_PROVIDER_SITE_OTHER): Payer: 59 | Admitting: Psychiatry

## 2021-05-24 ENCOUNTER — Other Ambulatory Visit: Payer: Self-pay | Admitting: Psychiatry

## 2021-05-24 ENCOUNTER — Encounter: Payer: Self-pay | Admitting: Psychiatry

## 2021-05-24 ENCOUNTER — Other Ambulatory Visit: Payer: Self-pay

## 2021-05-24 VITALS — BP 135/86 | HR 83 | Temp 98.1°F | Wt 255.6 lb

## 2021-05-24 DIAGNOSIS — G4700 Insomnia, unspecified: Secondary | ICD-10-CM | POA: Diagnosis not present

## 2021-05-24 DIAGNOSIS — F259 Schizoaffective disorder, unspecified: Secondary | ICD-10-CM | POA: Diagnosis not present

## 2021-05-24 DIAGNOSIS — F4310 Post-traumatic stress disorder, unspecified: Secondary | ICD-10-CM | POA: Diagnosis not present

## 2021-05-24 MED ORDER — LATUDA 60 MG PO TABS: 60.0000 mg | ORAL_TABLET | Freq: Every day | ORAL | 2 refills | Status: DC

## 2021-05-24 MED ORDER — BUPROPION HCL ER (XL) 150 MG PO TB24: 150.0000 mg | ORAL_TABLET | Freq: Every day | ORAL | 0 refills | Status: DC

## 2021-05-25 ENCOUNTER — Other Ambulatory Visit: Payer: Self-pay | Admitting: Psychiatry

## 2021-05-25 ENCOUNTER — Telehealth: Payer: Self-pay

## 2021-05-25 NOTE — Telephone Encounter (Signed)
pt called states that you forgot to refill her xanax please send to the pharmacy

## 2021-05-25 NOTE — Telephone Encounter (Signed)
Per database, it has been ordered by other provider, last filled in Oct for ten days. Could you ask her to hold this medication at this time, and take hydroxyzine as needed for anxiety? Thanks.

## 2021-06-18 ENCOUNTER — Telehealth: Payer: Self-pay | Admitting: Licensed Clinical Social Worker

## 2021-06-18 ENCOUNTER — Other Ambulatory Visit: Payer: Self-pay

## 2021-06-18 ENCOUNTER — Ambulatory Visit: Payer: 59 | Admitting: Licensed Clinical Social Worker

## 2021-06-18 NOTE — Addendum Note (Signed)
Addended by: Granville Lewis on: 06/18/2021 04:24 PM   Modules accepted: Level of Service

## 2021-06-18 NOTE — Telephone Encounter (Signed)
LCSW counselor tried to connect with patient for scheduled appointment via MyChart video text request x 2 and email request; also tried to connect via phone without success. LCSW counselor left message for patient to call office number to reschedule OPT appointment.   Attempt 1: Text and email: 4:02pm  Attempt 2: Text and email: 4:07pm  Attempt 3: phone call:  4:16pm--left message voice mail  Visit will be charged as NS

## 2021-06-18 NOTE — Progress Notes (Addendum)
Pt cancelled appt
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
## 2021-06-20 ENCOUNTER — Other Ambulatory Visit: Payer: Self-pay | Admitting: Psychiatry

## 2021-06-21 ENCOUNTER — Encounter: Payer: Self-pay | Admitting: Psychiatry

## 2021-06-21 ENCOUNTER — Ambulatory Visit (INDEPENDENT_AMBULATORY_CARE_PROVIDER_SITE_OTHER): Payer: Medicare Other | Admitting: Psychiatry

## 2021-06-21 ENCOUNTER — Other Ambulatory Visit: Payer: Self-pay

## 2021-06-21 VITALS — BP 121/80 | HR 91 | Temp 97.2°F | Wt 249.2 lb

## 2021-06-21 DIAGNOSIS — F259 Schizoaffective disorder, unspecified: Secondary | ICD-10-CM | POA: Diagnosis not present

## 2021-06-21 DIAGNOSIS — F4310 Post-traumatic stress disorder, unspecified: Secondary | ICD-10-CM | POA: Diagnosis not present

## 2021-06-21 DIAGNOSIS — G4700 Insomnia, unspecified: Secondary | ICD-10-CM | POA: Diagnosis not present

## 2021-06-21 MED ORDER — ALPRAZOLAM 0.5 MG PO TABS: 0.5000 mg | ORAL_TABLET | Freq: Every day | ORAL | 0 refills | Status: AC | PRN

## 2021-06-21 MED ORDER — BUPROPION HCL ER (XL) 150 MG PO TB24: 150.0000 mg | ORAL_TABLET | Freq: Every day | ORAL | 0 refills | Status: DC

## 2021-06-21 MED ORDER — ESZOPICLONE 1 MG PO TABS: 1.0000 mg | ORAL_TABLET | Freq: Every evening | ORAL | 0 refills | Status: DC | PRN

## 2021-06-21 NOTE — Patient Instructions (Signed)
Continue citalopram 40 mg daily  ?Continue lithium 450 mg twice a day  ?Obtain labs (lithium, BMP) ?Continue Latuda 60 mg daily- ?Continue bupropion 150 mg daily  ?Continue hydroxyzine 25 mg as needed for anxiety ?Discontinue Ambien ?Start Lunesta 1 mg at night as needed for insomnia ?Next appointment- 3/28 at 11:30 ?

## 2021-06-26 ENCOUNTER — Encounter: Payer: Self-pay | Admitting: Psychiatry

## 2021-06-26 LAB — BASIC METABOLIC PANEL
BUN/Creatinine Ratio: 9 (ref 9–23)
BUN: 6 mg/dL (ref 6–20)
CO2: 21 mmol/L (ref 20–29)
Calcium: 9.4 mg/dL (ref 8.7–10.2)
Chloride: 102 mmol/L (ref 96–106)
Creatinine, Ser: 0.64 mg/dL (ref 0.57–1.00)
Glucose: 123 mg/dL — ABNORMAL HIGH (ref 70–99)
Potassium: 4.5 mmol/L (ref 3.5–5.2)
Sodium: 138 mmol/L (ref 134–144)
eGFR: 116 mL/min/{1.73_m2} (ref >59)

## 2021-06-26 LAB — LITHIUM LEVEL: Lithium Lvl: 0.8 mmol/L (ref 0.5–1.2)
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
## 2021-07-17 ENCOUNTER — Encounter: Payer: Self-pay | Admitting: Psychiatry

## 2021-07-17 ENCOUNTER — Other Ambulatory Visit: Payer: Self-pay

## 2021-07-17 ENCOUNTER — Telehealth (INDEPENDENT_AMBULATORY_CARE_PROVIDER_SITE_OTHER): Payer: Medicare Other | Admitting: Psychiatry

## 2021-07-17 DIAGNOSIS — F4310 Post-traumatic stress disorder, unspecified: Secondary | ICD-10-CM

## 2021-07-17 DIAGNOSIS — F259 Schizoaffective disorder, unspecified: Secondary | ICD-10-CM

## 2021-07-17 DIAGNOSIS — G4700 Insomnia, unspecified: Secondary | ICD-10-CM

## 2021-07-17 MED ORDER — BUPROPION HCL ER (XL) 150 MG PO TB24: 150.0000 mg | ORAL_TABLET | Freq: Every day | ORAL | 0 refills | Status: DC

## 2021-07-17 MED ORDER — ESZOPICLONE 2 MG PO TABS: 2.0000 mg | ORAL_TABLET | Freq: Every evening | ORAL | 1 refills | Status: DC | PRN

## 2021-08-17 ENCOUNTER — Other Ambulatory Visit: Payer: Self-pay | Admitting: Psychiatry
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
## 2021-08-24 ENCOUNTER — Telehealth (INDEPENDENT_AMBULATORY_CARE_PROVIDER_SITE_OTHER): Payer: Medicare Other | Admitting: Psychiatry

## 2021-08-24 ENCOUNTER — Encounter: Payer: Self-pay | Admitting: Psychiatry

## 2021-08-24 DIAGNOSIS — G4700 Insomnia, unspecified: Secondary | ICD-10-CM

## 2021-08-24 DIAGNOSIS — F250 Schizoaffective disorder, bipolar type: Secondary | ICD-10-CM | POA: Diagnosis not present

## 2021-08-24 DIAGNOSIS — F4310 Post-traumatic stress disorder, unspecified: Secondary | ICD-10-CM

## 2021-08-24 MED ORDER — ESZOPICLONE 2 MG PO TABS: 2.0000 mg | ORAL_TABLET | Freq: Every evening | ORAL | 1 refills | Status: DC | PRN

## 2021-08-24 MED ORDER — BUPROPION HCL ER (XL) 150 MG PO TB24: 300.0000 mg | ORAL_TABLET | Freq: Every day | ORAL | 1 refills | Status: DC

## 2021-08-24 MED ORDER — LURASIDONE HCL 60 MG PO TABS: 60.0000 mg | ORAL_TABLET | Freq: Every day | ORAL | 2 refills | Status: DC

## 2021-08-24 NOTE — Patient Instructions (Signed)
Decrease  citalopram 20 mg daily  ?Continue lithium 450 mg twice a day  ?Continue Latuda 60 mg daily ?Increase bupropion 300 mg daily  ?Continue hydroxyzine 25 mg as needed for anxiety ?Continue Lunesta 2 mg at night as needed for insomnia ?Continue Xanax 0.5 mg daily as needed for anxiety  ?Next appointment- 6/7 at 1 PM ?

## 2021-09-11 ENCOUNTER — Telehealth: Payer: Self-pay | Admitting: Psychiatry

## 2021-09-11 NOTE — Telephone Encounter (Signed)
Will call her now.

## 2021-09-11 NOTE — Telephone Encounter (Signed)
gave patient information and ask her to have cardiology send over the office note for tomorrow visit.

## 2021-09-11 NOTE — Telephone Encounter (Signed)
Patient requests a call back from Dr. Modesta Messing re: possible medication side effect. Heart palpitations, chest pain, SOB for about a week. Has cardiac history. Seeing cardiologist tomorrow. Knows to call 911 or go to ER if worsens.  Contacted the patient due to the above message. Left voice message to contact our office.  Could you ask if she is having those symptoms since being on higher dose of bupropion? If so, please advise her to reduce it to 150 mg daily. Please let me know if she needs any refills of this medication. Please also advice to go to ER, call 911 or urgent care if any worsening in her cardiac symptoms.

## 2021-09-11 NOTE — Telephone Encounter (Signed)
Patient called requesting a call back from Dr. Modesta Messing re: possible medication side effect. Heart palpitations, chest pain, SOB for about a week. Has cardiac history.  Seeing cardiologist tomorrow. Knows to call 911 or go to ER if this worsens. Advised provider and CMA.

## 2021-09-12 ENCOUNTER — Encounter: Payer: Self-pay | Admitting: Obstetrics and Gynecology

## 2021-09-12 ENCOUNTER — Telehealth: Payer: Self-pay

## 2021-09-12 ENCOUNTER — Inpatient Hospital Stay: Payer: Medicare Other | Attending: Obstetrics and Gynecology | Admitting: Obstetrics and Gynecology

## 2021-09-12 VITALS — BP 124/88 | HR 81 | Temp 96.6°F | Resp 20 | Wt 244.4 lb

## 2021-09-12 DIAGNOSIS — D071 Carcinoma in situ of vulva: Secondary | ICD-10-CM

## 2021-09-12 NOTE — Telephone Encounter (Signed)
Noted, thanks. Will wait for the cardiology evaluation. Please contact the patient to see if she can have a sooner appointment this Friday in available slot.

## 2021-09-12 NOTE — Telephone Encounter (Signed)
pt left a message that she was returning dr. Modesta Messing call from yesterday.

## 2021-09-12 NOTE — Telephone Encounter (Signed)
Left voice message to contact us back. Please discuss the following with the patient.   Could you ask if she is having those symptoms since being on higher dose of bupropion? If so, please advise her to reduce it to 150 mg daily. Please let me know if she needs any refills of this medication. Please also advice to go to ER, call 911 or urgent care if any worsening in her cardiac symptoms.
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
## 2021-09-12 NOTE — Telephone Encounter (Signed)
pt came into the office and she stopped the wellbutrin yesterday and she states she still having the chest pain, SOB, and paplation.  She is see cardiology today at 4:00
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
## 2021-09-12 NOTE — Patient Instructions (Signed)
Please follow up with your gyn in 6 months

## 2021-09-14 ENCOUNTER — Encounter: Payer: Self-pay | Admitting: Psychiatry

## 2021-09-14 ENCOUNTER — Telehealth (INDEPENDENT_AMBULATORY_CARE_PROVIDER_SITE_OTHER): Payer: Medicare Other | Admitting: Psychiatry

## 2021-09-14 DIAGNOSIS — F250 Schizoaffective disorder, bipolar type: Secondary | ICD-10-CM | POA: Diagnosis not present

## 2021-09-14 DIAGNOSIS — G4700 Insomnia, unspecified: Secondary | ICD-10-CM

## 2021-09-14 DIAGNOSIS — F4310 Post-traumatic stress disorder, unspecified: Secondary | ICD-10-CM

## 2021-09-14 MED ORDER — HYDROXYZINE PAMOATE 25 MG PO CAPS: 25.0000 mg | ORAL_CAPSULE | Freq: Every day | ORAL | 2 refills | Status: DC | PRN

## 2021-09-14 MED ORDER — ESZOPICLONE 3 MG PO TABS: 3.0000 mg | ORAL_TABLET | Freq: Every day | ORAL | 0 refills | Status: DC

## 2021-09-14 NOTE — Patient Instructions (Signed)
Continue citalopram 20 mg daily  Continue lithium 450 mg twice a day  Continue Latuda 60 mg daily- mild rigidity on her left arm Hold bupropion  Continue hydroxyzine 25 mg as needed for anxiety Increase Lunesta 3 mg at night as needed for insomnia Continue Xanax 0.5 mg daily as needed for anxiety Next appointment- 6/7 at 1 PM, video

## 2021-09-14 NOTE — Addendum Note (Signed)
Addended by: Norman Clay on: 09/14/2021 10:59 AM   Modules accepted: Orders
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
## 2021-09-26 ENCOUNTER — Encounter: Payer: Self-pay | Admitting: Psychiatry

## 2021-09-26 ENCOUNTER — Telehealth (INDEPENDENT_AMBULATORY_CARE_PROVIDER_SITE_OTHER): Payer: Medicare Other | Admitting: Psychiatry

## 2021-09-26 DIAGNOSIS — G4700 Insomnia, unspecified: Secondary | ICD-10-CM | POA: Diagnosis not present

## 2021-09-26 DIAGNOSIS — F4310 Post-traumatic stress disorder, unspecified: Secondary | ICD-10-CM | POA: Diagnosis not present

## 2021-09-26 DIAGNOSIS — F250 Schizoaffective disorder, bipolar type: Secondary | ICD-10-CM

## 2021-09-26 MED ORDER — ESZOPICLONE 3 MG PO TABS: 3.0000 mg | ORAL_TABLET | Freq: Every day | ORAL | 0 refills | Status: DC

## 2021-09-26 MED ORDER — LURASIDONE HCL 80 MG PO TABS: 80.0000 mg | ORAL_TABLET | Freq: Every day | ORAL | 0 refills | Status: DC

## 2021-09-26 NOTE — Patient Instructions (Addendum)
Continue citalopram 20 mg daily  Continue lithium 450 mg twice a day Continue Latuda 80 mg at night Continue hydroxyzine 25 mg as needed for anxiety Increase Lunesta 3 mg at night as needed for insomnia Continue Xanax 0.5 mg daily as needed for anxiety  Next appointment- 7/6 at 2 PM, in person

## 2021-10-04 ENCOUNTER — Encounter: Payer: Self-pay | Admitting: Psychiatry

## 2021-10-06 ENCOUNTER — Other Ambulatory Visit: Payer: Self-pay | Admitting: Psychiatry

## 2021-10-17 ENCOUNTER — Telehealth: Payer: Self-pay

## 2021-10-17 NOTE — Telephone Encounter (Signed)
Medication management - Telephone call with patient, after she left a message requesting a new Lunesta order.  Informed patient this was e-scribed into her [facility] in [place], [place] by Dr. Modesta Messing on 09/26/21 for her to fill on or after 10/08/21.  Patient agreed to call her [facility] to have medication on file filled and to call back if any issues.
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
## 2021-10-24 ENCOUNTER — Other Ambulatory Visit: Payer: Self-pay | Admitting: Psychiatry

## 2021-10-25 ENCOUNTER — Encounter: Payer: Self-pay | Admitting: Psychiatry

## 2021-10-25 ENCOUNTER — Ambulatory Visit (INDEPENDENT_AMBULATORY_CARE_PROVIDER_SITE_OTHER): Payer: Medicare Other | Admitting: Psychiatry

## 2021-10-25 VITALS — BP 130/83 | HR 84 | Ht 65.0 in | Wt 249.0 lb

## 2021-10-25 DIAGNOSIS — G4700 Insomnia, unspecified: Secondary | ICD-10-CM | POA: Diagnosis not present

## 2021-10-25 DIAGNOSIS — F250 Schizoaffective disorder, bipolar type: Secondary | ICD-10-CM | POA: Diagnosis not present

## 2021-10-25 DIAGNOSIS — F4310 Post-traumatic stress disorder, unspecified: Secondary | ICD-10-CM

## 2021-10-25 MED ORDER — ESZOPICLONE 3 MG PO TABS: 3.0000 mg | ORAL_TABLET | Freq: Every evening | ORAL | 0 refills | Status: DC | PRN

## 2021-10-25 MED ORDER — SERTRALINE HCL 50 MG PO TABS: ORAL_TABLET | ORAL | 0 refills | Status: DC

## 2021-10-25 MED ORDER — LITHIUM CARBONATE ER 450 MG PO TBCR: 450.0000 mg | EXTENDED_RELEASE_TABLET | Freq: Two times a day (BID) | ORAL | 2 refills | Status: DC

## 2021-10-25 NOTE — Patient Instructions (Signed)
Decrease citalopram 10 mg daily for one week, then discontinue  Start sertraline 25 mg at night for one week, then 50 mg at night  Continue lithium 450 mg twice a day  Continue Latuda 80 mg at night- mild rigidity on her left arm Continue hydroxyzine 25 mg as needed for anxiety Continue Lunesta 3 mg at night as needed for insomnia Continue Xanax 0.5 mg daily as needed for anxiety  Next appointment- 8/3 at 2 PM

## 2021-11-03 ENCOUNTER — Other Ambulatory Visit: Payer: Self-pay | Admitting: Psychiatry

## 2021-11-17 ENCOUNTER — Other Ambulatory Visit: Payer: Self-pay | Admitting: Psychiatry
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
## 2021-11-22 ENCOUNTER — Ambulatory Visit (INDEPENDENT_AMBULATORY_CARE_PROVIDER_SITE_OTHER): Payer: Medicare Other | Admitting: Psychiatry

## 2021-11-22 ENCOUNTER — Encounter: Payer: Self-pay | Admitting: Psychiatry

## 2021-11-22 VITALS — BP 144/92 | HR 86 | Temp 97.6°F | Wt 248.8 lb

## 2021-11-22 DIAGNOSIS — G4700 Insomnia, unspecified: Secondary | ICD-10-CM | POA: Diagnosis not present

## 2021-11-22 DIAGNOSIS — F4310 Post-traumatic stress disorder, unspecified: Secondary | ICD-10-CM

## 2021-11-22 DIAGNOSIS — F250 Schizoaffective disorder, bipolar type: Secondary | ICD-10-CM | POA: Diagnosis not present

## 2021-11-22 MED ORDER — ESZOPICLONE 3 MG PO TABS: 3.0000 mg | ORAL_TABLET | Freq: Every evening | ORAL | 0 refills | Status: DC | PRN

## 2021-11-22 MED ORDER — ALPRAZOLAM 0.5 MG PO TABS: 0.5000 mg | ORAL_TABLET | Freq: Every day | ORAL | 1 refills | Status: AC | PRN

## 2021-11-22 MED ORDER — SERTRALINE HCL 100 MG PO TABS: 100.0000 mg | ORAL_TABLET | Freq: Every day | ORAL | 1 refills | Status: DC

## 2021-12-11 ENCOUNTER — Encounter: Payer: Self-pay | Admitting: Psychiatry

## 2021-12-11 LAB — LITHIUM LEVEL: Lithium Lvl: 0.6 mmol/L (ref 0.5–1.2)

## 2021-12-11 NOTE — Telephone Encounter (Signed)
This encounter was created in error - please disregard.

## 2021-12-14 ENCOUNTER — Telehealth: Payer: Self-pay | Admitting: Psychiatry

## 2021-12-14 ENCOUNTER — Other Ambulatory Visit: Payer: Self-pay | Admitting: Psychiatry

## 2021-12-14 DIAGNOSIS — F250 Schizoaffective disorder, bipolar type: Secondary | ICD-10-CM

## 2021-12-14 NOTE — Telephone Encounter (Signed)
There was an order for metabolic panel. I re-ordered today just in case, though.

## 2021-12-14 NOTE — Telephone Encounter (Signed)
Patient called stating after last appt on 11-22-21 she needed lab work. Supposed to get 2 done but only 1 was on the order. She needs an order for the metabolic order. Please advise

## 2021-12-16 ENCOUNTER — Other Ambulatory Visit: Payer: Self-pay | Admitting: Psychiatry

## 2021-12-17 NOTE — Telephone Encounter (Signed)
spoke with [facility] she states that they only drew the lithium levels she will try and go back this week to get the metabolic done.

## 2021-12-27 ENCOUNTER — Other Ambulatory Visit (HOSPITAL_COMMUNITY): Payer: Self-pay | Admitting: Psychiatry

## 2021-12-28 ENCOUNTER — Encounter: Payer: Self-pay | Admitting: Psychiatry

## 2021-12-28 ENCOUNTER — Telehealth: Payer: Self-pay

## 2021-12-28 LAB — BASIC METABOLIC PANEL
BUN/Creatinine Ratio: 7 — ABNORMAL LOW (ref 9–23)
BUN: 6 mg/dL (ref 6–20)
CO2: 20 mmol/L (ref 20–29)
Calcium: 9 mg/dL (ref 8.7–10.2)
Chloride: 102 mmol/L (ref 96–106)
Creatinine, Ser: 0.81 mg/dL (ref 0.57–1.00)
Glucose: 121 mg/dL — ABNORMAL HIGH (ref 70–99)
Potassium: 3.8 mmol/L (ref 3.5–5.2)
Sodium: 141 mmol/L (ref 134–144)
eGFR: 95 mL/min/{1.73_m2} (ref >59)

## 2021-12-28 NOTE — Progress Notes (Signed)
Labs reviewed. Results are in the letter, fyi.

## 2021-12-28 NOTE — Telephone Encounter (Signed)
per Dr. Modesta Messing order please go over patient labwork result. and let her know to follow up with her primary care provider. pt did states that she was not fasting with this labwork
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
## 2022-01-03 ENCOUNTER — Ambulatory Visit (INDEPENDENT_AMBULATORY_CARE_PROVIDER_SITE_OTHER): Payer: Medicare Other | Admitting: Psychiatry

## 2022-01-03 ENCOUNTER — Encounter: Payer: Self-pay | Admitting: Psychiatry

## 2022-01-03 VITALS — BP 138/84 | HR 74 | Ht 65.0 in | Wt 250.0 lb

## 2022-01-03 DIAGNOSIS — F4310 Post-traumatic stress disorder, unspecified: Secondary | ICD-10-CM

## 2022-01-03 DIAGNOSIS — F259 Schizoaffective disorder, unspecified: Secondary | ICD-10-CM | POA: Diagnosis not present

## 2022-01-03 DIAGNOSIS — G4700 Insomnia, unspecified: Secondary | ICD-10-CM | POA: Diagnosis not present

## 2022-01-03 MED ORDER — LURASIDONE HCL 80 MG PO TABS: 80.0000 mg | ORAL_TABLET | Freq: Every day | ORAL | 1 refills | Status: DC

## 2022-01-03 MED ORDER — SERTRALINE HCL 100 MG PO TABS: 150.0000 mg | ORAL_TABLET | Freq: Every day | ORAL | 1 refills | Status: DC

## 2022-01-03 MED ORDER — ESZOPICLONE 3 MG PO TABS: 3.0000 mg | ORAL_TABLET | Freq: Every evening | ORAL | 0 refills | Status: DC | PRN

## 2022-01-04 ENCOUNTER — Telehealth (HOSPITAL_COMMUNITY): Payer: Self-pay | Admitting: Psychiatry

## 2022-01-04 NOTE — Telephone Encounter (Signed)
D:  Dr. Modesta Messing referred pt to [facility].  A:  Placed call and oriented pt.  Pt is requesting to verify her benefits first to see if she can afford MH-IOP.  Encouraged pt to call the case mgr afterwards.  Inform Dr. Modesta Messing.  R:  Pt receptive.

## 2022-01-07 ENCOUNTER — Other Ambulatory Visit: Payer: Self-pay | Admitting: Psychiatry

## 2022-01-10 ENCOUNTER — Telehealth (INDEPENDENT_AMBULATORY_CARE_PROVIDER_SITE_OTHER): Payer: Medicare Other | Admitting: Psychiatry

## 2022-01-10 DIAGNOSIS — F259 Schizoaffective disorder, unspecified: Secondary | ICD-10-CM

## 2022-01-10 NOTE — Progress Notes (Signed)
Virtual Visit via Telephone Note  I connected with Susan Tanner on 01/10/22 at  1:00 PM EDT by telephone and verified that I am speaking with the correct person using two identifiers.  Location: Patient: Home Provider: Hospital   I discussed the limitations, risks, security and privacy concerns of performing an evaluation and management service by telephone and the availability of in person appointments. I also discussed with the patient that there may be a patient responsible charge related to this service. The patient expressed understanding and agreed to proceed.   History of Present Illness: Patient reached by telephone.  This was a scheduled consultation appointment for electroconvulsive therapy arranged by the patient's primary psychiatrist.  Identities established and reason for appointment established.  Patient reports that she continues to suffer from depressed mood hopelessness fatigue and lack of interest in activities essentially all of the time.  She has been experiencing this for about 12 years at least with some ups and downs but recently things have been steadily impaired despite medication.  Patient feels sad doll and has a lack of interest and no energy.  She denies having any thought or plan of wanting to kill herself.  She additionally has the symptom of seeing things at times.  She says this has started to happen more recently.  She describes seeing what she calls "shadows" that looks somewhat like people.  They happen more commonly at night.  This has been a specific symptom for her when she is depressed in the past.  Not reporting auditory hallucinations.  No other report of psychosis.  No homicidal ideation.  Patient currently sees an outpatient psychiatrist and is on a combination of medication including Latuda, Zoloft, lithium Lunesta and hydroxyzine for her mood symptoms with only partial improvement.  Patient is alert and oriented and appropriate and understanding of the  conversation.  She expresses an understanding of ECT when I described it to her.  She was able to ask appropriate questions about risks and benefits.  Affect was euthymic and reactive.  Thoughts sounded lucid and clear no sign of delusional or bizarre thinking.  No report of current suicidal or homicidal ideation.  Despite visual hallucinations does not sound like she is having psychotic thinking.  Patient has a history of a cardiomyopathy and is on lisinopril but no other cardiac disease.  No history of coronary artery disease or stroke.  No history of surgery to her head.  She has had anesthesia in the past with some nausea complicating it but no other complications.  Socially the patient is married and lives with her husband and 2 children.  Reports supportive environment at home with her family.    Observations/Objective: This is a 41 year old woman with a history of severe recurrent major depression possibly with psychotic symptoms versus her standard diagnosis of schizoaffective disorder.  She continues to suffer from symptoms despite multiple trials of appropriate medication.  Patient has no contraindications to ECT.  We discussed ECT describing in general the treatment and the specifics of our procedure at the hospital.  Patient was advised of risks and benefits including side effects including pain nausea confusion and short-term memory impairment.  She asked questions appropriately about all of this.  I recommended that we consider proceeding with electroconvulsive therapy patient is agreeable.   Assessment and Plan: Plan is to begin the procedure of working up for ECT.  Her information will be passed on to the rest of the ECT team and the patient should  be contacted within a week to discuss further plans for lab studies followed by scheduling treatment.  I informed the patient that I would want her to stop her lithium prior to beginning ECT probably about 3 days prior to treatment.  Patient  understood and was agreeable to this.  I made no suggestions about any immediate changes to any medications or other treatment   Follow Up Instructions:    I discussed the assessment and treatment plan with the patient. The patient was provided an opportunity to ask questions and all were answered. The patient agreed with the plan and demonstrated an understanding of the instructions.   The patient was advised to call back or seek an in-person evaluation if the symptoms worsen or if the condition fails to improve as anticipated.  I provided 30 minutes of non-face-to-face time during this encounter.   [name], MD

## 2022-01-11 ENCOUNTER — Telehealth: Payer: Self-pay

## 2022-01-16 ENCOUNTER — Other Ambulatory Visit: Payer: Medicare Other

## 2022-01-16 ENCOUNTER — Other Ambulatory Visit: Payer: Self-pay | Admitting: Psychiatry

## 2022-01-16 ENCOUNTER — Ambulatory Visit
Admission: RE | Admit: 2022-01-16 | Discharge: 2022-01-16 | Disposition: A | Discharge status: Home / Self Care | Payer: Medicare Other | Source: Ambulatory Visit | Attending: Psychiatry | Admitting: Psychiatry

## 2022-01-16 ENCOUNTER — Encounter
Admission: RE | Admit: 2022-01-16 | Discharge: 2022-01-16 | Disposition: A | Discharge status: Home / Self Care | Payer: Medicare Other | Source: Ambulatory Visit | Attending: Psychiatry | Admitting: Psychiatry

## 2022-01-16 VITALS — BP 132/88 | HR 74 | Resp 16 | Ht 65.0 in | Wt 248.0 lb

## 2022-01-16 DIAGNOSIS — Z01818 Encounter for other preprocedural examination: Secondary | ICD-10-CM | POA: Insufficient documentation

## 2022-01-16 DIAGNOSIS — R200 Anesthesia of skin: Secondary | ICD-10-CM

## 2022-01-16 HISTORY — DX: Family history of other specified conditions: Z84.89

## 2022-01-16 HISTORY — DX: Nausea with vomiting, unspecified: R11.2

## 2022-01-16 HISTORY — DX: Ventricular premature depolarization: I49.3

## 2022-01-16 HISTORY — DX: Personal history of Methicillin resistant Staphylococcus aureus infection: Z86.14

## 2022-01-16 HISTORY — DX: Anemia, unspecified: D64.9

## 2022-01-16 HISTORY — DX: Hyperlipidemia, unspecified: E78.5

## 2022-01-16 HISTORY — DX: Gastro-esophageal reflux disease without esophagitis: K21.9

## 2022-01-16 HISTORY — DX: Other complications of anesthesia, initial encounter: T88.59XA

## 2022-01-16 HISTORY — DX: Essential (primary) hypertension: I10

## 2022-01-16 HISTORY — DX: Morbid (severe) obesity due to excess calories: E66.01

## 2022-01-16 HISTORY — DX: Dilated cardiomyopathy: I42.0

## 2022-01-16 HISTORY — DX: COVID-19: U07.1

## 2022-01-16 HISTORY — DX: Carcinoma in situ of vulva: D07.1

## 2022-01-16 HISTORY — DX: Other specified postprocedural states: Z98.890

## 2022-01-16 HISTORY — DX: Post-traumatic stress disorder, unspecified: F43.10

## 2022-01-16 LAB — CBC
HCT: 36.9 % (ref 36.0–46.0)
Hemoglobin: 11.6 g/dL — ABNORMAL LOW (ref 12.0–15.0)
MCH: 26.4 pg (ref 26.0–34.0)
MCHC: 31.4 g/dL (ref 30.0–36.0)
MCV: 84.1 fL (ref 80.0–100.0)
Platelets: 357 10*3/uL (ref 150–400)
RBC: 4.39 MIL/uL (ref 3.87–5.11)
RDW: 13.3 % (ref 11.5–15.5)
WBC: 12.1 10*3/uL — ABNORMAL HIGH (ref 4.0–10.5)
nRBC: 0 % (ref 0.0–0.2)

## 2022-01-16 LAB — BASIC METABOLIC PANEL
Anion gap: 9 (ref 5–15)
BUN: 8 mg/dL (ref 6–20)
CO2: 23 mmol/L (ref 22–32)
Calcium: 9.3 mg/dL (ref 8.9–10.3)
Chloride: 108 mmol/L (ref 98–111)
Creatinine, Ser: 0.68 mg/dL (ref 0.44–1.00)
GFR, Estimated: >60 mL/min (ref >60)
Glucose, Bld: 87 mg/dL (ref 70–99)
Potassium: 3.9 mmol/L (ref 3.5–5.1)
Sodium: 140 mmol/L (ref 135–145)

## 2022-01-18 ENCOUNTER — Other Ambulatory Visit: Payer: Self-pay | Admitting: Psychiatry

## 2022-01-18 ENCOUNTER — Other Ambulatory Visit: Payer: Medicare Other

## 2022-01-18 DIAGNOSIS — R200 Anesthesia of skin: Secondary | ICD-10-CM

## 2022-01-21 ENCOUNTER — Encounter: Payer: Self-pay | Admitting: Certified Registered"

## 2022-01-21 ENCOUNTER — Encounter
Admission: RE | Admit: 2022-01-21 | Discharge: 2022-01-21 | Disposition: A | Discharge status: Home / Self Care | Payer: Medicare Other | Source: Ambulatory Visit | Attending: Nurse Practitioner | Admitting: Nurse Practitioner

## 2022-01-21 ENCOUNTER — Ambulatory Visit: Payer: Self-pay | Admitting: Certified Registered"

## 2022-01-21 DIAGNOSIS — F332 Major depressive disorder, recurrent severe without psychotic features: Secondary | ICD-10-CM | POA: Insufficient documentation

## 2022-01-21 DIAGNOSIS — Z8616 Personal history of COVID-19: Secondary | ICD-10-CM | POA: Insufficient documentation

## 2022-01-21 LAB — GLUCOSE, CAPILLARY: Glucose-Capillary: 119 mg/dL — ABNORMAL HIGH (ref 70–99)

## 2022-01-21 MED ORDER — ONDANSETRON HCL 4 MG/2ML IJ SOLN: 4.0000 mg | Freq: Once | INTRAMUSCULAR | Status: DC | PRN

## 2022-01-21 MED ORDER — METHOHEXITAL SODIUM 100 MG/10ML IV SOSY
PREFILLED_SYRINGE | INTRAVENOUS | Status: DC | PRN
  Administered 2022-01-21: 100 mg via INTRAVENOUS

## 2022-01-21 MED ORDER — FENTANYL CITRATE (PF) 100 MCG/2ML IJ SOLN: 25.0000 ug | INTRAMUSCULAR | Status: DC | PRN

## 2022-01-21 MED ORDER — METHOHEXITAL SODIUM 0.5 G IJ SOLR
INTRAMUSCULAR | Status: AC
  Filled 2022-01-21: qty 500

## 2022-01-21 MED ORDER — LABETALOL HCL 5 MG/ML IV SOLN
INTRAVENOUS | Status: DC | PRN
  Administered 2022-01-21: 10 mg via INTRAVENOUS

## 2022-01-21 MED ORDER — SUCCINYLCHOLINE CHLORIDE 200 MG/10ML IV SOSY
PREFILLED_SYRINGE | INTRAVENOUS | Status: DC | PRN
  Administered 2022-01-21: 120 mg via INTRAVENOUS

## 2022-01-21 MED ORDER — SODIUM CHLORIDE 0.9 % IV SOLN: INTRAVENOUS | Status: DC | PRN

## 2022-01-21 MED ORDER — LIDOCAINE 2% (20 MG/ML) 5 ML SYRINGE
INTRAMUSCULAR | Status: DC | PRN
  Administered 2022-01-21: 100 mg via INTRAVENOUS

## 2022-01-21 NOTE — Anesthesia Preprocedure Evaluation (Signed)
Anesthesia Evaluation  Patient identified by MRN, date of birth, ID band Patient awake    Reviewed: Allergy & Precautions, NPO status , Patient's Chart, lab work & pertinent test results  History of Anesthesia Complications (+) PONV and history of anesthetic complications  Airway Mallampati: II  TM Distance: >3 FB     Dental  (+) Teeth Intact   Pulmonary asthma , sleep apnea , Current Smoker,     + decreased breath sounds      Cardiovascular Exercise Tolerance: Good hypertension, Pt. on medications  Rhythm:Regular Rate:Normal     Neuro/Psych Anxiety Depression Bipolar Disorder Schizophrenia negative neurological ROS     GI/Hepatic Neg liver ROS, GERD  Medicated,  Endo/Other  diabetes, Well Controlled, Type 2Morbid obesity  Renal/GU negative Renal ROS     Musculoskeletal   Abdominal (+) + obese,   Peds negative pediatric ROS (+)  Hematology  (+) Blood dyscrasia, anemia ,   Anesthesia Other Findings   Reproductive/Obstetrics                             Anesthesia Physical Anesthesia Plan  ASA: 3  Anesthesia Plan: General   Post-op Pain Management:    Induction: Intravenous  PONV Risk Score and Plan:   Airway Management Planned: Natural Airway and Mask  Additional Equipment:   Intra-op Plan:   Post-operative Plan:   Informed Consent: I have reviewed the patients History and Physical, chart, labs and discussed the procedure including the risks, benefits and alternatives for the proposed anesthesia with the patient or authorized representative who has indicated his/her understanding and acceptance.       Plan Discussed with: CRNA and Surgeon  Anesthesia Plan Comments:         Anesthesia Quick Evaluation

## 2022-01-21 NOTE — Anesthesia Postprocedure Evaluation (Signed)
Anesthesia Post Note  Patient: Susan Tanner  Procedure(s) Performed: ECT TX  Patient location during evaluation: PACU Anesthesia Type: General Level of consciousness: awake and oriented Pain management: pain level controlled Vital Signs Assessment: post-procedure vital signs reviewed and stable Respiratory status: spontaneous breathing and respiratory function stable Cardiovascular status: stable Anesthetic complications: no   No notable events documented.   Last Vitals:  Vitals:   01/21/22 1338 01/21/22 1351  BP: (!) 140/94 (!) 138/92  Pulse: 84 80  Resp: 19 18  Temp: 36.8 C 36.8 C  SpO2: 95%     Last Pain:  Vitals:   01/21/22 1351  TempSrc: Oral  PainSc: 0-No pain                 VAN STAVEREN,Cullan Launer

## 2022-01-21 NOTE — Procedures (Signed)
ECT SERVICES Physician's Interval Evaluation & Treatment Note  Patient Identification: Susan Tanner MRN:  [id number] Date of Evaluation:  01/21/2022 TX #: 1  MADRS:   MMSE:   P.E. Findings:  No physical exam problems  Psychiatric Interval Note:  Depressed and anxious  Subjective:  Patient is a 41 y.o. female seen for evaluation for Electroconvulsive Therapy. Depressed  Treatment Summary:   '[x]'$   Right Unilateral             '[]'$  Bilateral   % Energy : 0.3 ms 40%   Impedance: 1420 ohms  Seizure Energy Index: 25,794 V squared  Postictal Suppression Index: 46%  Seizure Concordance Index: 90%  Medications  Pre Shock: Brevital 100 mg succinylcholine 120 mg  Post Shock:    Seizure Duration: 19 seconds EMG 61 seconds EEG   Comments: Increase succinylcholine dose to 150 mg next time  Lungs:  '[x]'$   Clear to auscultation               '[]'$  Other:   Heart:    '[x]'$   Regular rhythm             '[]'$  irregular rhythm    '[x]'$   Previous H&P reviewed, patient examined and there are NO CHANGES                 '[]'$   Previous H&P reviewed, patient examined and there are changes noted.   Susan Berthold, MD 10/2/20236:35 PM

## 2022-01-21 NOTE — Transfer of Care (Signed)
Immediate Anesthesia Transfer of Care Note  Patient: Susan Tanner  Procedure(s) Performed: ECT TX  Patient Location: PACU  Anesthesia Type:General  Level of Consciousness: drowsy  Airway & Oxygen Therapy: Patient Spontanous Breathing and Patient connected to nasal cannula oxygen  Post-op Assessment: Report given to RN  Post vital signs: stable  Last Vitals:  Vitals Value Taken Time  BP    Temp    Pulse    Resp    SpO2      Last Pain:  Vitals:   01/21/22 1044  TempSrc:   PainSc: 0-No pain         Complications: No notable events documented.

## 2022-01-21 NOTE — H&P (Signed)
Susan Tanner is an 41 y.o. female.   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[place])    Complication of anesthesia    COVID-19    Depression    Diabetes mellitus without complication ([place])    Endometriosis    Family history of adverse reaction to anesthesia    half-brother-n/v,another brother hard to wake up   Frequent PVCs    GERD (gastroesophageal reflux disease)    Heart abnormality    History of methicillin resistant staphylococcus aureus (MRSA)    Hyperlipidemia    Hypertension    Morbid obesity ([place])    PONV (postoperative nausea and vomiting)    PTSD (post-traumatic stress disorder)    Vulvar intraepithelial neoplasia (VIN) grade 3     Past Surgical History:  Procedure Laterality Date   ABDOMINAL HYSTERECTOMY     APPENDECTOMY     TUBAL LIGATION      Family History  Problem Relation Age of Onset   Bipolar disorder Brother    Drug abuse Brother    Schizophrenia Maternal Uncle    Diabetes Maternal Grandfather    Diabetes Paternal Grandfather    Cancer Cousin    Brain cancer Cousin    Social History:  reports that she has been smoking cigars. She has never used smokeless tobacco. She reports current alcohol use. She reports current drug use. Drug: Marijuana.  Allergies:  Allergies  Allergen Reactions   Depakote Er [Divalproex Sodium Er] Other (See Comments)    Affects liver enzymes   Cefuroxime Axetil Other (See Comments)    Muscle pain   Loratadine-Pseudoephedrine Er     Muscle pain. Other reaction(s): Muscle Pain   Percocet [Oxycodone-Acetaminophen] Itching   Valproic Acid     Other reaction(s): Liver Disorder "affects liver" Other reaction(s): Liver Disorder    Ziprasidone Hcl Other (See Comments)    Inflames her liver, elevated LFTs Other reaction(s): Liver Disorder    Cariprazine Palpitations   Lamictal [Lamotrigine] Rash   Tape Rash    Ok to [place] paper tape    (Not in a hospital admission)   No results found for this or any previous visit (from the past 48 hour(s)). No results found.  Review of Systems  Constitutional: Negative.   HENT: Negative.    Eyes: Negative.   Respiratory: Negative.    Cardiovascular: Negative.   Gastrointestinal: Negative.   Musculoskeletal: Negative.   Skin: Negative.   Neurological: Negative.   Psychiatric/Behavioral: Negative.      Blood pressure (!) 152/98, pulse 70, temperature 98.6 F (37 C), temperature source Oral, resp. rate 18, height '5\' 5"'$  (1.651 m), weight 112.5 kg, SpO2 98 %. Physical Exam Vitals and nursing note reviewed.  Constitutional:      Appearance: She is well-developed.  HENT:     Head: Normocephalic and atraumatic.  Eyes:     Conjunctiva/sclera: Conjunctivae normal.     Pupils: Pupils are equal, round, and reactive to light.  Cardiovascular:     Heart sounds: Normal heart sounds.  Pulmonary:     Effort: Pulmonary effort is normal.  Abdominal:     Palpations: Abdomen is soft.  Musculoskeletal:        General: Normal range of motion.     Cervical back: Normal range of motion.  Skin:    General: Skin is warm and dry.  Neurological:     General: No focal deficit present.     Mental Status: She is alert.  Psychiatric:        Mood and Affect: Mood normal.      Assessment/Plan Begin index  [name], MD 01/21/2022, 12:20 PM

## 2022-01-21 NOTE — H&P (Signed)
Susan Tanner is an 41 y.o. female.   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[place])    Complication of anesthesia    COVID-19    Depression    Diabetes mellitus without complication ([place])    Endometriosis    Family history of adverse reaction to anesthesia    half-brother-n/v,another brother hard to wake up   Frequent PVCs    GERD (gastroesophageal reflux disease)    Heart abnormality    History of methicillin resistant staphylococcus aureus (MRSA)    Hyperlipidemia    Hypertension    Morbid obesity ([facility])    PONV (postoperative nausea and vomiting)    PTSD (post-traumatic stress disorder)    Vulvar intraepithelial neoplasia (VIN) grade 3     Past Surgical History:  Procedure Laterality Date   ABDOMINAL HYSTERECTOMY     APPENDECTOMY     TUBAL LIGATION      Family History  Problem Relation Age of Onset   Bipolar disorder Brother    Drug abuse Brother    Schizophrenia Maternal Uncle    Diabetes Maternal Grandfather    Diabetes Paternal Grandfather    Cancer Cousin    Brain cancer Cousin    Social History:  reports that she has been smoking cigars. She has never used smokeless tobacco. She reports current alcohol use. She reports current drug use. Drug: Marijuana.  Allergies:  Allergies  Allergen Reactions   Depakote Er [Divalproex Sodium Er] Other (See Comments)    Affects liver enzymes   Cefuroxime Axetil Other (See Comments)    Muscle pain   Loratadine-Pseudoephedrine Er     Muscle pain. Other reaction(s): Muscle Pain   Percocet [Oxycodone-Acetaminophen] Itching   Valproic Acid     Other reaction(s): Liver Disorder "affects liver" Other reaction(s): Liver Disorder    Ziprasidone Hcl Other (See Comments)    Inflames her liver, elevated LFTs Other reaction(s): Liver Disorder    Cariprazine Palpitations   Lamictal [Lamotrigine] Rash   Tape Rash    Ok to [place] paper tape    (Not in a hospital admission)   Results for orders placed or performed during the hospital encounter of 01/21/22 (from the past 48 hour(s))  Glucose, capillary     Status: Abnormal   Collection Time: 01/21/22  1:16 PM  Result Value Ref Range   Glucose-Capillary 119 (H) 70 - 99 mg/dL    Comment: Glucose reference range applies only to samples taken after fasting for at least 8 hours.   No results found.  Review of Systems  Constitutional: Negative.   HENT: Negative.    Eyes: Negative.   Respiratory: Negative.    Cardiovascular: Negative.   Gastrointestinal: Negative.   Musculoskeletal: Negative.   Skin: Negative.   Neurological: Negative.   Psychiatric/Behavioral: Negative.      Blood pressure (!) 138/92, pulse 80, temperature 98.3 F (36.8 C), temperature source Oral, resp. rate 18, height '5\' 5"'$  (1.651 m), weight 112.5 kg, SpO2 95 %. Physical Exam Constitutional:      Appearance: She is well-developed.  HENT:     Head: Normocephalic and atraumatic.  Eyes:     Conjunctiva/sclera: Conjunctivae normal.     Pupils: Pupils are equal, round, and reactive to light.  Cardiovascular:     Heart  sounds: Normal heart sounds.  Pulmonary:     Effort: Pulmonary effort is normal.  Abdominal:     Palpations: Abdomen is soft.  Musculoskeletal:        General: Normal range of motion.     Cervical back: Normal range of motion.  Skin:    General: Skin is warm and dry.  Neurological:     Mental Status: She is alert.  Psychiatric:        Attention and Perception: Attention normal.        Mood and Affect: Mood is depressed.      Assessment/Plan Initiating index course of right unilateral ECT today  [name], MD 01/21/2022, 6:25 PM

## 2022-01-22 ENCOUNTER — Other Ambulatory Visit: Payer: Self-pay | Admitting: Psychiatry

## 2022-01-22 DIAGNOSIS — R200 Anesthesia of skin: Secondary | ICD-10-CM

## 2022-01-23 ENCOUNTER — Encounter (HOSPITAL_BASED_OUTPATIENT_CLINIC_OR_DEPARTMENT_OTHER)
Admission: RE | Admit: 2022-01-23 | Discharge: 2022-01-23 | Disposition: A | Discharge status: Home / Self Care | Payer: Medicare Other | Source: Ambulatory Visit | Attending: Psychiatry | Admitting: Psychiatry

## 2022-01-23 ENCOUNTER — Encounter: Payer: Self-pay | Admitting: Anesthesiology

## 2022-01-23 ENCOUNTER — Ambulatory Visit: Payer: Self-pay | Admitting: Anesthesiology

## 2022-01-23 DIAGNOSIS — F332 Major depressive disorder, recurrent severe without psychotic features: Secondary | ICD-10-CM | POA: Diagnosis not present

## 2022-01-23 DIAGNOSIS — R200 Anesthesia of skin: Secondary | ICD-10-CM

## 2022-01-23 LAB — GLUCOSE, CAPILLARY: Glucose-Capillary: 115 mg/dL — ABNORMAL HIGH (ref 70–99)

## 2022-01-23 MED ORDER — SODIUM CHLORIDE 0.9 % IV SOLN: INTRAVENOUS | Status: DC | PRN

## 2022-01-23 MED ORDER — LABETALOL HCL 5 MG/ML IV SOLN
INTRAVENOUS | Status: DC | PRN
  Administered 2022-01-23: 10 mg via INTRAVENOUS

## 2022-01-23 MED ORDER — SUCCINYLCHOLINE CHLORIDE 200 MG/10ML IV SOSY
PREFILLED_SYRINGE | INTRAVENOUS | Status: DC | PRN
  Administered 2022-01-23: 150 mg via INTRAVENOUS

## 2022-01-23 MED ORDER — ONDANSETRON HCL 4 MG/2ML IJ SOLN: 4.0000 mg | Freq: Once | INTRAMUSCULAR | Status: DC | PRN

## 2022-01-23 MED ORDER — ACETAMINOPHEN 160 MG/5ML PO SOLN: 325.0000 mg | ORAL | Status: DC | PRN

## 2022-01-23 MED ORDER — GLYCOPYRROLATE 0.2 MG/ML IJ SOLN: 0.2000 mg | Freq: Once | INTRAMUSCULAR | Status: DC

## 2022-01-23 MED ORDER — LABETALOL HCL 5 MG/ML IV SOLN
INTRAVENOUS | Status: AC
  Filled 2022-01-23: qty 4

## 2022-01-23 MED ORDER — KETOROLAC TROMETHAMINE 30 MG/ML IJ SOLN: 30.0000 mg | Freq: Once | INTRAMUSCULAR | Status: DC

## 2022-01-23 MED ORDER — GLYCOPYRROLATE 0.2 MG/ML IJ SOLN: 0.1000 mg | Freq: Once | INTRAMUSCULAR | Status: DC

## 2022-01-23 MED ORDER — GLYCOPYRROLATE 0.2 MG/ML IJ SOLN
INTRAMUSCULAR | Status: DC | PRN
  Administered 2022-01-23: .2 mg via INTRAVENOUS

## 2022-01-23 MED ORDER — METHOHEXITAL SODIUM 100 MG/10ML IV SOSY
PREFILLED_SYRINGE | INTRAVENOUS | Status: DC | PRN
  Administered 2022-01-23: 100 mg via INTRAVENOUS

## 2022-01-23 MED ORDER — SODIUM CHLORIDE 0.9 % IV SOLN: 500.0000 mL | Freq: Once | INTRAVENOUS | Status: DC

## 2022-01-23 MED ORDER — GLYCOPYRROLATE 0.2 MG/ML IJ SOLN
INTRAMUSCULAR | Status: AC
  Filled 2022-01-23: qty 1

## 2022-01-23 MED ORDER — SUCCINYLCHOLINE CHLORIDE 200 MG/10ML IV SOSY
PREFILLED_SYRINGE | INTRAVENOUS | Status: AC
  Filled 2022-01-23: qty 10

## 2022-01-23 MED ORDER — ACETAMINOPHEN 325 MG PO TABS: 325.0000 mg | ORAL_TABLET | ORAL | Status: DC | PRN

## 2022-01-23 MED ORDER — METHOHEXITAL SODIUM 0.5 G IJ SOLR
INTRAMUSCULAR | Status: AC
  Filled 2022-01-23: qty 500

## 2022-01-23 MED ORDER — HYDROMORPHONE HCL 1 MG/ML IJ SOLN: 0.2500 mg | INTRAMUSCULAR | Status: DC | PRN

## 2022-01-23 NOTE — Anesthesia Preprocedure Evaluation (Signed)
Anesthesia Evaluation  Patient identified by MRN, date of birth, ID band Patient awake    Reviewed: Allergy & Precautions, NPO status , Patient's Chart, lab work & pertinent test results  History of Anesthesia Complications (+) PONV and history of anesthetic complications  Airway Mallampati: II  TM Distance: >3 FB     Dental  (+) Teeth Intact   Pulmonary asthma , sleep apnea , Current Smoker and Patient abstained from smoking.,     + decreased breath sounds      Cardiovascular Exercise Tolerance: Good hypertension, Pt. on medications  Rhythm:Regular Rate:Normal     Neuro/Psych PSYCHIATRIC DISORDERS Anxiety Depression Bipolar Disorder Schizophrenia negative neurological ROS     GI/Hepatic Neg liver ROS, GERD  Medicated,  Endo/Other  diabetes, Well Controlled, Type 2Morbid obesity  Renal/GU negative Renal ROS     Musculoskeletal   Abdominal (+) + obese,   Peds negative pediatric ROS (+)  Hematology  (+) Blood dyscrasia, anemia ,   Anesthesia Other Findings   Reproductive/Obstetrics                             Anesthesia Physical  Anesthesia Plan  ASA: 3  Anesthesia Plan: General   Post-op Pain Management: Minimal or no pain anticipated   Induction: Intravenous  PONV Risk Score and Plan: 3 and Ondansetron and Treatment may vary due to age or medical condition  Airway Management Planned: Natural Airway and Mask  Additional Equipment:   Intra-op Plan:   Post-operative Plan:   Informed Consent: I have reviewed the patients History and Physical, chart, labs and discussed the procedure including the risks, benefits and alternatives for the proposed anesthesia with the patient or authorized representative who has indicated his/her understanding and acceptance.       Plan Discussed with: CRNA and Surgeon  Anesthesia Plan Comments:         Anesthesia Quick Evaluation

## 2022-01-23 NOTE — Transfer of Care (Signed)
Immediate Anesthesia Transfer of Care Note  Patient: Susan Tanner  Procedure(s) Performed: ECT TX  Patient Location: PACU  Anesthesia Type:General  Level of Consciousness: drowsy  Airway & Oxygen Therapy: Patient Spontanous Breathing and Patient connected to nasal cannula oxygen  Post-op Assessment: Report given to RN and Post -op Vital signs reviewed and stable  Post vital signs: Reviewed and stable  Last Vitals:  Vitals Value Taken Time  BP 151/81 01/23/22 1303  Temp    Pulse 91 01/23/22 1304  Resp 21 01/23/22 1304  SpO2 95 % 01/23/22 1304  Vitals shown include unvalidated device data.  Last Pain: There were no vitals filed for this visit.       Complications: No notable events documented.

## 2022-01-23 NOTE — Procedures (Signed)
ECT SERVICES Physician's Interval Evaluation & Treatment Note  Patient Identification: Susan Tanner MRN:  [id number] Date of Evaluation:  01/23/2022 TX #: 2  MADRS:   MMSE:   P.E. Findings:  No change to physical exam  Psychiatric Interval Note:  Depressed with no different  Subjective:  Patient is a 41 y.o. female seen for evaluation for Electroconvulsive Therapy. Dysphoric multiple muscle complaints  Treatment Summary:   '[x]'$   Right Unilateral             '[]'$  Bilateral   % Energy : 0.3 ms 45%   Impedance: 1300 ohms  Seizure Energy Index: 10,089 V squared  Postictal Suppression Index: Less than 19%  Seizure Concordance Index: 94%  Medications  Pre Shock: Brevital 60 mg succinylcholine 80 mg  Post Shock:    Seizure Duration: 31 seconds EMG 52 seconds EEG   Comments: Follow-up Friday  Lungs:  '[x]'$   Clear to auscultation               '[]'$  Other:   Heart:    '[x]'$   Regular rhythm             '[]'$  irregular rhythm    '[x]'$   Previous H&P reviewed, patient examined and there are NO CHANGES                 '[]'$   Previous H&P reviewed, patient examined and there are changes noted.   [name], MD 10/4/20234:32 PM

## 2022-01-23 NOTE — Anesthesia Postprocedure Evaluation (Signed)
Anesthesia Post Note  Patient: Susan Tanner  Procedure(s) Performed: ECT TX  Patient location during evaluation: PACU Anesthesia Type: General Level of consciousness: awake and alert Pain management: pain level controlled Vital Signs Assessment: post-procedure vital signs reviewed and stable Respiratory status: spontaneous breathing, nonlabored ventilation, respiratory function stable and patient connected to nasal cannula oxygen Cardiovascular status: blood pressure returned to baseline and stable Postop Assessment: no apparent nausea or vomiting Anesthetic complications: no   No notable events documented.   Last Vitals:  Vitals:   01/23/22 1310 01/23/22 1320  BP: (!) 154/95 (!) 154/90  Pulse: 87 82  Resp: 16 (!) 21  Temp:    SpO2: 92% 97%    Last Pain:  Vitals:   01/23/22 1320  PainSc: 0-No pain                 Ilene Qua

## 2022-01-23 NOTE — H&P (Signed)
Susan Tanner is an 41 y.o. female.   Chief Complaint: Patient had some muscle aches and pains all over and some jaw pain after the last treatment.  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[place])    Complication of anesthesia    COVID-19    Depression    Diabetes mellitus without complication ([place])    Endometriosis    Family history of adverse reaction to anesthesia    half-brother-n/v,another brother hard to wake up   Frequent PVCs    GERD (gastroesophageal reflux disease)    Heart abnormality    History of methicillin resistant staphylococcus aureus (MRSA)    Hyperlipidemia    Hypertension    Morbid obesity ([facility])    PONV (postoperative nausea and vomiting)    PTSD (post-traumatic stress disorder)    Vulvar intraepithelial neoplasia (VIN) grade 3     Past Surgical History:  Procedure Laterality Date   ABDOMINAL HYSTERECTOMY     APPENDECTOMY     TUBAL LIGATION      Family History  Problem Relation Age of Onset   Bipolar disorder Brother    Drug abuse Brother    Schizophrenia Maternal Uncle    Diabetes Maternal Grandfather    Diabetes Paternal Grandfather    Cancer Cousin    Brain cancer Cousin    Social History:  reports that she has been smoking cigars. She has never used smokeless tobacco. She reports current alcohol use. She reports current drug use. Drug: Marijuana.  Allergies:  Allergies  Allergen Reactions   Depakote Er [Divalproex Sodium Er] Other (See Comments)    Affects liver enzymes   Cefuroxime Axetil Other (See Comments)    Muscle pain   Loratadine-Pseudoephedrine Er     Muscle pain. Other reaction(s): Muscle Pain   Percocet [Oxycodone-Acetaminophen] Itching   Valproic Acid     Other reaction(s): Liver Disorder "affects liver" Other reaction(s): Liver Disorder    Ziprasidone Hcl  Other (See Comments)    Inflames her liver, elevated LFTs Other reaction(s): Liver Disorder   Cariprazine Palpitations   Lamictal [Lamotrigine] Rash   Tape Rash    Ok to [place] paper tape    (Not in a hospital admission)   Results for orders placed or performed during the hospital encounter of 01/23/22 (from the past 48 hour(s))  Glucose, capillary     Status: Abnormal   Collection Time: 01/23/22  1:08 PM  Result Value Ref Range   Glucose-Capillary 115 (H) 70 - 99 mg/dL    Comment: Glucose reference range applies only to samples taken after fasting for at least 8 hours.   No results found.  Review of Systems  Constitutional: Negative.   HENT: Negative.    Eyes: Negative.   Respiratory: Negative.    Cardiovascular: Negative.   Gastrointestinal: Negative.   Musculoskeletal:  Positive for arthralgias.  Skin: Negative.   Neurological: Negative.   Psychiatric/Behavioral:  Positive for dysphoric mood.     Blood pressure (!) 135/90, pulse 73, temperature 98.7 F (37.1 C), temperature source Oral, resp. rate 18, height '5\' 5"'$  (1.651 m), SpO2 93 %. Physical Exam Vitals reviewed.  Constitutional:      Appearance: She is well-developed.  HENT:     Head: Normocephalic and atraumatic.  Eyes:  Conjunctiva/sclera: Conjunctivae normal.     Pupils: Pupils are equal, round, and reactive to light.  Cardiovascular:     Heart sounds: Normal heart sounds.  Pulmonary:     Effort: Pulmonary effort is normal.  Abdominal:     Palpations: Abdomen is soft.  Musculoskeletal:        General: Normal range of motion.     Cervical back: Normal range of motion.  Skin:    General: Skin is warm and dry.  Neurological:     Mental Status: She is alert.  Psychiatric:        Attention and Perception: Attention normal.        Mood and Affect: Mood is depressed.        Thought Content: Thought content does not include suicidal ideation.      Assessment/Plan Continue ECT today's treatment  #2  [name], MD 01/23/2022, 4:17 PM

## 2022-01-24 ENCOUNTER — Other Ambulatory Visit: Payer: Self-pay | Admitting: Psychiatry

## 2022-01-24 DIAGNOSIS — R200 Anesthesia of skin: Secondary | ICD-10-CM

## 2022-01-25 ENCOUNTER — Ambulatory Visit: Payer: Self-pay | Admitting: Certified Registered"

## 2022-01-25 ENCOUNTER — Ambulatory Visit
Admission: RE | Admit: 2022-01-25 | Discharge: 2022-01-25 | Disposition: A | Discharge status: Home / Self Care | Payer: Medicare Other | Source: Ambulatory Visit | Attending: Psychiatry | Admitting: Psychiatry

## 2022-01-25 ENCOUNTER — Encounter: Payer: Self-pay | Admitting: Certified Registered"

## 2022-01-25 DIAGNOSIS — F332 Major depressive disorder, recurrent severe without psychotic features: Secondary | ICD-10-CM

## 2022-01-25 DIAGNOSIS — F319 Bipolar disorder, unspecified: Secondary | ICD-10-CM | POA: Insufficient documentation

## 2022-01-25 DIAGNOSIS — R200 Anesthesia of skin: Secondary | ICD-10-CM | POA: Insufficient documentation

## 2022-01-25 LAB — GLUCOSE, CAPILLARY: Glucose-Capillary: 115 mg/dL — ABNORMAL HIGH (ref 70–99)

## 2022-01-25 MED ORDER — LABETALOL HCL 5 MG/ML IV SOLN
INTRAVENOUS | Status: DC | PRN
  Administered 2022-01-25: 5 mg via INTRAVENOUS

## 2022-01-25 MED ORDER — GLYCOPYRROLATE 0.2 MG/ML IJ SOLN
INTRAMUSCULAR | Status: AC
  Administered 2022-01-25: 0.2 mg via INTRAVENOUS
  Filled 2022-01-25: qty 1

## 2022-01-25 MED ORDER — KETOROLAC TROMETHAMINE 30 MG/ML IJ SOLN
INTRAMUSCULAR | Status: AC
  Administered 2022-01-25: 30 mg via INTRAVENOUS
  Filled 2022-01-25: qty 1

## 2022-01-25 MED ORDER — KETOROLAC TROMETHAMINE 30 MG/ML IJ SOLN: 30.0000 mg | Freq: Once | INTRAMUSCULAR | Status: AC

## 2022-01-25 MED ORDER — SUCCINYLCHOLINE CHLORIDE 200 MG/10ML IV SOSY
PREFILLED_SYRINGE | INTRAVENOUS | Status: DC | PRN
  Administered 2022-01-25: 150 mg via INTRAVENOUS

## 2022-01-25 MED ORDER — METHOHEXITAL SODIUM 0.5 G IJ SOLR
INTRAMUSCULAR | Status: AC
  Filled 2022-01-25: qty 500

## 2022-01-25 MED ORDER — GLYCOPYRROLATE 0.2 MG/ML IJ SOLN: 0.2000 mg | Freq: Once | INTRAMUSCULAR | Status: AC

## 2022-01-25 MED ORDER — SODIUM CHLORIDE 0.9 % IV SOLN
500.0000 mL | Freq: Once | INTRAVENOUS | Status: AC
  Administered 2022-01-25: 500 mL via INTRAVENOUS

## 2022-01-25 MED ORDER — METHOHEXITAL SODIUM 100 MG/10ML IV SOSY
PREFILLED_SYRINGE | INTRAVENOUS | Status: DC | PRN
  Administered 2022-01-25: 100 mg via INTRAVENOUS

## 2022-01-25 NOTE — Anesthesia Postprocedure Evaluation (Signed)
Anesthesia Post Note  Patient: Susan Tanner  Procedure(s) Performed: ECT TX  Patient location during evaluation: PACU Anesthesia Type: General Level of consciousness: awake and alert Pain management: pain level controlled Vital Signs Assessment: post-procedure vital signs reviewed and stable Respiratory status: spontaneous breathing, nonlabored ventilation and respiratory function stable Cardiovascular status: blood pressure returned to baseline and stable Postop Assessment: no apparent nausea or vomiting Anesthetic complications: no   No notable events documented.   Last Vitals:  Vitals:   01/25/22 1455 01/25/22 1500  BP:  (!) 151/101  Pulse:  82  Resp:  15  Temp:    SpO2: 92% 91%    Last Pain:  Vitals:   01/25/22 1455  TempSrc:   PainSc: 0-No pain                 [place] Ouch

## 2022-01-25 NOTE — Transfer of Care (Signed)
Immediate Anesthesia Transfer of Care Note  Patient: Susan Tanner  Procedure(s) Performed: ECT TX  Patient Location: PACU  Anesthesia Type:General  Level of Consciousness: alert , drowsy and patient cooperative  Airway & Oxygen Therapy: Patient Spontanous Breathing and Patient connected to face mask oxygen  Post-op Assessment: Report given to RN and Post -op Vital signs reviewed and stable  Post vital signs: stable  Last Vitals:  Vitals Value Taken Time  BP 163/108 01/25/22 1430  Temp    Pulse 83 01/25/22 1430  Resp 19 01/25/22 1430  SpO2 96 % 01/25/22 1430  Vitals shown include unvalidated device data.  Last Pain:  Vitals:   01/25/22 1034  TempSrc:   PainSc: 0-No pain         Complications: No notable events documented.

## 2022-01-25 NOTE — Anesthesia Preprocedure Evaluation (Signed)
Anesthesia Evaluation  Patient identified by MRN, date of birth, ID band Patient awake    Reviewed: Allergy & Precautions, NPO status , Patient's Chart, lab work & pertinent test results  History of Anesthesia Complications (+) PONV and history of anesthetic complications  Airway Mallampati: II  TM Distance: >3 FB     Dental  (+) Teeth Intact   Pulmonary asthma , sleep apnea , Current Smoker and Patient abstained from smoking.,     + decreased breath sounds      Cardiovascular Exercise Tolerance: Good hypertension, Pt. on medications  Rhythm:Regular Rate:Normal  Cardiomyopathy, dilated  Frequent PVCs   Neuro/Psych PSYCHIATRIC DISORDERS Anxiety Depression Bipolar Disorder Schizophrenia negative neurological ROS     GI/Hepatic Neg liver ROS, GERD  Medicated,  Endo/Other  diabetes, Well Controlled, Type 2Morbid obesity  Renal/GU negative Renal ROS     Musculoskeletal   Abdominal (+) + obese,   Peds negative pediatric ROS (+)  Hematology  (+) Blood dyscrasia, anemia ,   Anesthesia Other Findings   Reproductive/Obstetrics                             Anesthesia Physical  Anesthesia Plan  ASA: 3  Anesthesia Plan: General   Post-op Pain Management: Minimal or no pain anticipated   Induction: Intravenous  PONV Risk Score and Plan: 3 and Ondansetron and Treatment may vary due to age or medical condition  Airway Management Planned: Natural Airway and Mask  Additional Equipment:   Intra-op Plan:   Post-operative Plan:   Informed Consent: I have reviewed the patients History and Physical, chart, labs and discussed the procedure including the risks, benefits and alternatives for the proposed anesthesia with the patient or authorized representative who has indicated his/her understanding and acceptance.       Plan Discussed with: CRNA and Surgeon  Anesthesia Plan Comments:          Anesthesia Quick Evaluation

## 2022-01-25 NOTE — H&P (Signed)
Susan Tanner is an 41 y.o. female.   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[place])    Complication of anesthesia    COVID-19    Depression    Diabetes mellitus without complication ([place])    Endometriosis    Family history of adverse reaction to anesthesia    half-brother-n/v,another brother hard to wake up   Frequent PVCs    GERD (gastroesophageal reflux disease)    Heart abnormality    History of methicillin resistant staphylococcus aureus (MRSA)    Hyperlipidemia    Hypertension    Morbid obesity ([facility])    PONV (postoperative nausea and vomiting)    PTSD (post-traumatic stress disorder)    Vulvar intraepithelial neoplasia (VIN) grade 3     Past Surgical History:  Procedure Laterality Date   ABDOMINAL HYSTERECTOMY     APPENDECTOMY     TUBAL LIGATION      Family History  Problem Relation Age of Onset   Bipolar disorder Brother    Drug abuse Brother    Schizophrenia Maternal Uncle    Diabetes Maternal Grandfather    Diabetes Paternal Grandfather    Cancer Cousin    Brain cancer Cousin    Social History:  reports that she has been smoking cigars. She has never used smokeless tobacco. She reports current alcohol use. She reports current drug use. Drug: Marijuana.  Allergies:  Allergies  Allergen Reactions   Depakote Er [Divalproex Sodium Er] Other (See Comments)    Affects liver enzymes   Cefuroxime Axetil Other (See Comments)    Muscle pain   Loratadine-Pseudoephedrine Er     Muscle pain. Other reaction(s): Muscle Pain   Percocet [Oxycodone-Acetaminophen] Itching   Valproic Acid     Other reaction(s): Liver Disorder "affects liver" Other reaction(s): Liver Disorder    Ziprasidone Hcl Other (See Comments)    Inflames her liver, elevated LFTs Other reaction(s): Liver Disorder    Cariprazine Palpitations   Lamictal [Lamotrigine] Rash   Tape Rash    Ok to [place] paper tape    (Not in a hospital admission)   Results for orders placed or performed during the hospital encounter of 01/23/22 (from the past 48 hour(s))  Glucose, capillary     Status: Abnormal   Collection Time: 01/23/22  1:08 PM  Result Value Ref Range   Glucose-Capillary 115 (H) 70 - 99 mg/dL    Comment: Glucose reference range applies only to samples taken after fasting for at least 8 hours.   No results found.  Review of Systems  Constitutional: Negative.   HENT: Negative.    Eyes: Negative.   Respiratory: Negative.    Cardiovascular: Negative.   Gastrointestinal: Negative.   Musculoskeletal: Negative.   Skin: Negative.   Neurological: Negative.   Psychiatric/Behavioral: Negative.      Blood pressure (!) 169/107, pulse 71, temperature 98.7 F (37.1 C), temperature source Oral, resp. rate 18, height '5\' 5"'$  (1.651 m), weight 112.5 kg, SpO2 98 %. Physical Exam Vitals and nursing note reviewed.  Constitutional:      Appearance: She is well-developed.  HENT:     Head: Normocephalic and atraumatic.  Eyes:     Conjunctiva/sclera: Conjunctivae normal.     Pupils: Pupils are equal, round, and reactive to  light.  Cardiovascular:     Heart sounds: Normal heart sounds.  Pulmonary:     Effort: Pulmonary effort is normal.  Abdominal:     Palpations: Abdomen is soft.  Musculoskeletal:        General: Normal range of motion.     Cervical back: Normal range of motion.  Skin:    General: Skin is warm and dry.  Neurological:     General: No focal deficit present.     Mental Status: She is alert.  Psychiatric:        Mood and Affect: Mood normal.        Thought Content: Thought content normal.      Assessment/Plan Continue index. Extra careful with bite block  [name], MD 01/25/2022, 12:38 PM

## 2022-01-25 NOTE — Procedures (Signed)
ECT SERVICES Physician's Interval Evaluation & Treatment Note  Patient Identification: Susan Tanner MRN:  [id number] Date of Evaluation:  01/25/2022 TX #: 3  MADRS:   MMSE:   P.E. Findings:  No change to physical exam  Psychiatric Interval Note:  Not necessarily feeling better but looks a little bit brighter and more relaxed  Subjective:  Patient is a 41 y.o. female seen for evaluation for Electroconvulsive Therapy. No specific new complaint.  Chronic depression  Treatment Summary:   '[x]'$   Right Unilateral             '[]'$  Bilateral   % Energy : 0.3 ms 40%   Impedance: 1980 ohms  Seizure Energy Index: 25,522 V squared  Postictal Suppression Index: 48%  Seizure Concordance Index: 98%  Medications  Pre Shock: Robinul 0.2 mg Brevital 100 mg succinylcholine 150 mg labetalol 10 mg  Post Shock:    Seizure Duration: 24 seconds EMG 59 seconds EEG   Comments: Follow-up next treatment Monday  Lungs:  '[x]'$   Clear to auscultation               '[]'$  Other:   Heart:    '[x]'$   Regular rhythm             '[]'$  irregular rhythm    '[x]'$   Previous H&P reviewed, patient examined and there are NO CHANGES                 '[]'$   Previous H&P reviewed, patient examined and there are changes noted.   [name], MD 10/6/20235:38 PM

## 2022-01-27 ENCOUNTER — Other Ambulatory Visit: Payer: Self-pay | Admitting: Psychiatry

## 2022-01-27 DIAGNOSIS — R200 Anesthesia of skin: Secondary | ICD-10-CM

## 2022-01-28 ENCOUNTER — Encounter (HOSPITAL_BASED_OUTPATIENT_CLINIC_OR_DEPARTMENT_OTHER)
Admission: RE | Admit: 2022-01-28 | Discharge: 2022-01-28 | Disposition: A | Discharge status: Home / Self Care | Payer: Medicare Other | Source: Ambulatory Visit | Attending: Psychiatry | Admitting: Psychiatry

## 2022-01-28 ENCOUNTER — Encounter: Payer: Self-pay | Admitting: Registered Nurse

## 2022-01-28 DIAGNOSIS — R200 Anesthesia of skin: Secondary | ICD-10-CM

## 2022-01-28 DIAGNOSIS — F332 Major depressive disorder, recurrent severe without psychotic features: Secondary | ICD-10-CM | POA: Diagnosis not present

## 2022-01-28 MED ORDER — LIDOCAINE HCL (CARDIAC) PF 100 MG/5ML IV SOSY
PREFILLED_SYRINGE | INTRAVENOUS | Status: DC | PRN
  Administered 2022-01-28: 100 mg via INTRAVENOUS

## 2022-01-28 MED ORDER — ONDANSETRON HCL 4 MG/2ML IJ SOLN: 4.0000 mg | Freq: Once | INTRAMUSCULAR | Status: DC | PRN

## 2022-01-28 MED ORDER — METHOHEXITAL SODIUM 100 MG/10ML IV SOSY
PREFILLED_SYRINGE | INTRAVENOUS | Status: DC | PRN
  Administered 2022-01-28: 100 mg via INTRAVENOUS

## 2022-01-28 MED ORDER — KETOROLAC TROMETHAMINE 30 MG/ML IJ SOLN: 30.0000 mg | Freq: Once | INTRAMUSCULAR | Status: DC

## 2022-01-28 MED ORDER — SODIUM CHLORIDE 0.9 % IV SOLN: INTRAVENOUS | Status: DC | PRN

## 2022-01-28 MED ORDER — KETOROLAC TROMETHAMINE 30 MG/ML IJ SOLN
INTRAMUSCULAR | Status: DC | PRN
  Administered 2022-01-28: 30 mg via INTRAVENOUS

## 2022-01-28 MED ORDER — LABETALOL HCL 5 MG/ML IV SOLN
INTRAVENOUS | Status: DC | PRN
  Administered 2022-01-28: 10 mg via INTRAVENOUS

## 2022-01-28 MED ORDER — DEXTROSE-NACL 5-0.45 % IV SOLN: INTRAVENOUS | Status: DC

## 2022-01-28 MED ORDER — KETOROLAC TROMETHAMINE 30 MG/ML IJ SOLN
INTRAMUSCULAR | Status: AC
  Filled 2022-01-28: qty 1

## 2022-01-28 MED ORDER — SODIUM CHLORIDE 0.9 % IV SOLN: 500.0000 mL | Freq: Once | INTRAVENOUS | Status: DC

## 2022-01-28 MED ORDER — FENTANYL CITRATE (PF) 100 MCG/2ML IJ SOLN: 25.0000 ug | INTRAMUSCULAR | Status: DC | PRN

## 2022-01-28 MED ORDER — GLYCOPYRROLATE 0.2 MG/ML IJ SOLN: 0.2000 mg | Freq: Once | INTRAMUSCULAR | Status: DC

## 2022-01-28 NOTE — H&P (Signed)
Susan Tanner is an 41 y.o. female.   Chief Complaint: Chronic depression and low energy.  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[place])    Complication of anesthesia    COVID-19    Depression    Diabetes mellitus without complication ([place])    Endometriosis    Family history of adverse reaction to anesthesia    half-brother-n/v,another brother hard to wake up   Frequent PVCs    GERD (gastroesophageal reflux disease)    Heart abnormality    History of methicillin resistant staphylococcus aureus (MRSA)    Hyperlipidemia    Hypertension    Morbid obesity (HCC)    PONV (postoperative nausea and vomiting)    PTSD (post-traumatic stress disorder)    Vulvar intraepithelial neoplasia (VIN) grade 3     Past Surgical History:  Procedure Laterality Date   ABDOMINAL HYSTERECTOMY     APPENDECTOMY     TUBAL LIGATION      Family History  Problem Relation Age of Onset   Bipolar disorder Brother    Drug abuse Brother    Schizophrenia Maternal Uncle    Diabetes Maternal Grandfather    Diabetes Paternal Grandfather    Cancer Cousin    Brain cancer Cousin    Social History:  reports that she has been smoking cigars. She has never used smokeless tobacco. She reports current alcohol use. She reports current drug use. Drug: Marijuana.  Allergies:  Allergies  Allergen Reactions   Depakote Er [Divalproex Sodium Er] Other (See Comments)    Affects liver enzymes   Cefuroxime Axetil Other (See Comments)    Muscle pain   Loratadine-Pseudoephedrine Er     Muscle pain. Other reaction(s): Muscle Pain   Percocet [Oxycodone-Acetaminophen] Itching   Valproic Acid     Other reaction(s): Liver Disorder "affects liver" Other reaction(s): Liver Disorder    Ziprasidone Hcl Other (See Comments)    Inflames her liver,  elevated LFTs Other reaction(s): Liver Disorder   Cariprazine Palpitations   Lamictal [Lamotrigine] Rash   Tape Rash    Ok to [place] paper tape    (Not in a hospital admission)   No results found for this or any previous visit (from the past 48 hour(s)). No results found.  Review of Systems  Constitutional: Negative.   HENT: Negative.    Eyes: Negative.   Respiratory: Negative.    Cardiovascular: Negative.   Gastrointestinal: Negative.   Musculoskeletal:  Positive for myalgias.  Skin: Negative.   Neurological: Negative.   Psychiatric/Behavioral: Negative.      Blood pressure 138/89, pulse 79, temperature 97.8 F (36.6 C), temperature source Oral, resp. rate 18, height '5\' 5"'$  (1.651 m), weight 112.5 kg, SpO2 95 %. Physical Exam Vitals reviewed.  Constitutional:      Appearance: She is well-developed.  HENT:     Head: Normocephalic and atraumatic.  Eyes:     Conjunctiva/sclera: Conjunctivae normal.     Pupils: Pupils are equal, round, and reactive to light.  Cardiovascular:     Heart sounds: Normal heart sounds.  Pulmonary:     Effort: Pulmonary effort is normal.  Abdominal:     Palpations: Abdomen is soft.  Musculoskeletal:        General: Normal range of motion.  Cervical back: Normal range of motion.  Skin:    General: Skin is warm and dry.  Neurological:     General: No focal deficit present.     Mental Status: She is alert.  Psychiatric:        Attention and Perception: Attention normal.        Mood and Affect: Mood is depressed.      Assessment/Plan Continue index course  [name], MD 01/28/2022, 4:40 PM

## 2022-01-28 NOTE — Anesthesia Procedure Notes (Signed)
Date/Time: 01/28/2022 1:33 PM  Performed by: Doreen Salvage, CRNAPre-anesthesia Checklist: Patient identified, Emergency Drugs available, Suction available and Patient being monitored Patient Re-evaluated:Patient Re-evaluated prior to induction Oxygen Delivery Method: Circle system utilized Preoxygenation: Pre-oxygenation with 100% oxygen Induction Type: IV induction Ventilation: Mask ventilation without difficulty and Mask ventilation throughout procedure Airway Equipment and Method: Bite block Placement Confirmation: positive ETCO2 Dental Injury: Teeth and Oropharynx as per pre-operative assessment

## 2022-01-28 NOTE — Procedures (Signed)
ECT SERVICES Physician's Interval Evaluation & Treatment Note  Patient Identification: SANIA NOY MRN:  [id number] Date of Evaluation:  01/28/2022 TX #: 4  MADRS:   MMSE:   P.E. Findings:  No change to physical exam  Psychiatric Interval Note:  Patient still complaining of low energy although admits there may be some slight improvement in mood overall  Subjective:  Patient is a 41 y.o. female seen for evaluation for Electroconvulsive Therapy. See above  Treatment Summary:   '[x]'$   Right Unilateral             '[]'$  Bilateral   % Energy : 0.3 ms 40%   Impedance: 1250 ohms  Seizure Energy Index: 28,765 V squared  Postictal Suppression Index: 37%  Seizure Concordance Index: 60%  Medications  Pre Shock: Robinul 0.2 mg Brevital 100 mg succinylcholine 150 mg labetalol 10 mg  Post Shock: None  Seizure Duration: 22 seconds EMG 40 seconds EEG   Comments: A remarkable amount of movement given the succinylcholine dose.  We will increase to 200 mg next time  Lungs:  '[x]'$   Clear to auscultation               '[]'$  Other:   Heart:    '[x]'$   Regular rhythm             '[]'$  irregular rhythm    '[x]'$   Previous H&P reviewed, patient examined and there are NO CHANGES                 '[]'$   Previous H&P reviewed, patient examined and there are changes noted.   [name], MD 10/9/20234:46 PM

## 2022-01-28 NOTE — Anesthesia Preprocedure Evaluation (Signed)
Anesthesia Evaluation  Patient identified by MRN, date of birth, ID band Patient awake    Reviewed: Allergy & Precautions, NPO status , Patient's Chart, lab work & pertinent test results  Airway Mallampati: II  TM Distance: >3 FB     Dental  (+) Teeth Intact   Pulmonary asthma , sleep apnea and Continuous Positive Airway Pressure Ventilation , Current Smoker    + decreased breath sounds      Cardiovascular Exercise Tolerance: Good hypertension, Pt. on medications  Rhythm:Regular Rate:Normal     Neuro/Psych   Anxiety Depression Bipolar Disorder Schizophrenia  negative neurological ROS     GI/Hepatic Neg liver ROS,GERD  ,,  Endo/Other  diabetes, Type 2, Oral Hypoglycemic Agents  Morbid obesity  Renal/GU negative Renal ROS     Musculoskeletal   Abdominal  (+) + obese  Peds negative pediatric ROS (+)  Hematology  (+) Blood dyscrasia, anemia   Anesthesia Other Findings   Reproductive/Obstetrics                             Anesthesia Physical Anesthesia Plan  ASA: 3  Anesthesia Plan:    Post-op Pain Management:    Induction: Intravenous  PONV Risk Score and Plan:   Airway Management Planned: Natural Airway and Mask  Additional Equipment:   Intra-op Plan:   Post-operative Plan:   Informed Consent: I have reviewed the patients History and Physical, chart, labs and discussed the procedure including the risks, benefits and alternatives for the proposed anesthesia with the patient or authorized representative who has indicated his/her understanding and acceptance.       Plan Discussed with: CRNA and Surgeon  Anesthesia Plan Comments:        Anesthesia Quick Evaluation

## 2022-01-28 NOTE — Anesthesia Postprocedure Evaluation (Signed)
Anesthesia Post Note  Patient: Susan Tanner  Procedure(s) Performed: ECT TX  Anesthetic complications: no   No notable events documented.   Last Vitals:  Vitals:   01/28/22 1400 01/28/22 1410  BP: (!) 137/90 (!) 142/94  Pulse: 82 80  Resp: 17 18  Temp:  36.6 C  SpO2: 96% 95%    Last Pain:  Vitals:   01/28/22 1410  TempSrc:   PainSc: 0-No pain                 VAN STAVEREN,Marji Kuehnel

## 2022-01-28 NOTE — Transfer of Care (Signed)
Immediate Anesthesia Transfer of Care Note  Patient: Susan Tanner  Procedure(s) Performed: * No procedures listed *  Patient Location: PACU  Anesthesia Type:General  Level of Consciousness: sedated  Airway & Oxygen Therapy: Patient Spontanous Breathing and Patient connected to face mask oxygen  Post-op Assessment: Report given to RN and Post -op Vital signs reviewed and stable  Post vital signs: Reviewed and stable  Last Vitals:  Vitals:   01/28/22 1100 01/28/22 1346  BP: (!) 145/97 (!) 161/95  Pulse: 72   Resp: 16 (!) 23  Temp: 37 C (!) 36.3 C  SpO2: 22% 41%    Complications: No apparent anesthesia complications

## 2022-01-29 ENCOUNTER — Other Ambulatory Visit: Payer: Self-pay | Admitting: Psychiatry

## 2022-01-29 DIAGNOSIS — R200 Anesthesia of skin: Secondary | ICD-10-CM

## 2022-01-31 ENCOUNTER — Other Ambulatory Visit: Payer: Self-pay | Admitting: Psychiatry

## 2022-01-31 DIAGNOSIS — R200 Anesthesia of skin: Secondary | ICD-10-CM

## 2022-02-01 ENCOUNTER — Encounter (HOSPITAL_BASED_OUTPATIENT_CLINIC_OR_DEPARTMENT_OTHER)
Admission: RE | Admit: 2022-02-01 | Discharge: 2022-02-01 | Disposition: A | Discharge status: Home / Self Care | Payer: Medicare Other | Source: Ambulatory Visit | Attending: Psychiatry | Admitting: Psychiatry

## 2022-02-01 ENCOUNTER — Encounter: Payer: Self-pay | Admitting: Certified Registered"

## 2022-02-01 ENCOUNTER — Ambulatory Visit: Payer: Self-pay | Admitting: Certified Registered"

## 2022-02-01 DIAGNOSIS — F332 Major depressive disorder, recurrent severe without psychotic features: Secondary | ICD-10-CM | POA: Diagnosis not present

## 2022-02-01 DIAGNOSIS — R200 Anesthesia of skin: Secondary | ICD-10-CM

## 2022-02-01 MED ORDER — SUCCINYLCHOLINE CHLORIDE 200 MG/10ML IV SOSY
PREFILLED_SYRINGE | INTRAVENOUS | Status: DC | PRN
  Administered 2022-02-01: 200 mg via INTRAVENOUS

## 2022-02-01 MED ORDER — KETOROLAC TROMETHAMINE 30 MG/ML IJ SOLN: 30.0000 mg | Freq: Once | INTRAMUSCULAR | Status: AC

## 2022-02-01 MED ORDER — KETOROLAC TROMETHAMINE 30 MG/ML IJ SOLN
INTRAMUSCULAR | Status: AC
  Administered 2022-02-01: 30 mg via INTRAVENOUS
  Filled 2022-02-01: qty 1

## 2022-02-01 MED ORDER — MIDAZOLAM HCL 2 MG/2ML IJ SOLN
INTRAMUSCULAR | Status: AC
  Filled 2022-02-01: qty 2

## 2022-02-01 MED ORDER — MIDAZOLAM HCL 2 MG/2ML IJ SOLN
2.0000 mg | Freq: Once | INTRAMUSCULAR | Status: AC
  Administered 2022-02-01: 2 mg via INTRAVENOUS

## 2022-02-01 MED ORDER — LIDOCAINE HCL (CARDIAC) PF 100 MG/5ML IV SOSY
PREFILLED_SYRINGE | INTRAVENOUS | Status: DC | PRN
  Administered 2022-02-01: 100 mg via INTRAVENOUS

## 2022-02-01 MED ORDER — GLYCOPYRROLATE 0.2 MG/ML IJ SOLN
INTRAMUSCULAR | Status: AC
  Administered 2022-02-01: 0.2 mg via INTRAVENOUS
  Filled 2022-02-01: qty 1

## 2022-02-01 MED ORDER — SODIUM CHLORIDE 0.9 % IV SOLN
500.0000 mL | Freq: Once | INTRAVENOUS | Status: AC
  Administered 2022-02-01: 500 mL via INTRAVENOUS

## 2022-02-01 MED ORDER — GLYCOPYRROLATE 0.2 MG/ML IJ SOLN: 0.2000 mg | Freq: Once | INTRAMUSCULAR | Status: AC

## 2022-02-01 MED ORDER — LABETALOL HCL 5 MG/ML IV SOLN
INTRAVENOUS | Status: DC | PRN
  Administered 2022-02-01 (×2): 10 mg via INTRAVENOUS

## 2022-02-01 NOTE — Anesthesia Postprocedure Evaluation (Signed)
Anesthesia Post Note  Patient: Susan Tanner  Procedure(s) Performed: ECT TX  Patient location during evaluation: PACU Anesthesia Type: General Level of consciousness: awake and alert Pain management: pain level controlled Vital Signs Assessment: post-procedure vital signs reviewed and stable Respiratory status: spontaneous breathing, nonlabored ventilation, respiratory function stable and patient connected to nasal cannula oxygen Cardiovascular status: blood pressure returned to baseline and stable Postop Assessment: no apparent nausea or vomiting Anesthetic complications: no   No notable events documented.   Last Vitals:  Vitals:   02/01/22 1201 02/01/22 1208  BP: (!) 154/96 (!) 172/98  Pulse: 73 73  Resp: 20 18  Temp:  37.1 C  SpO2: 94% 95%    Last Pain:  Vitals:   02/01/22 1208  TempSrc: Oral  PainSc:                  Dimas Millin

## 2022-02-01 NOTE — Procedures (Signed)
ECT SERVICES Physician's Interval Evaluation & Treatment Note  Patient Identification: Susan Tanner MRN:  [id number] Date of Evaluation:  02/01/2022 TX #: 5  MADRS:   MMSE:   P.E. Findings:  No change to physical exam  Psychiatric Interval Note:  Brighter and more relaxed  Subjective:  Patient is a 41 y.o. female seen for evaluation for Electroconvulsive Therapy. Patient is able to acknowledge that she is feeling better  Treatment Summary:   '[x]'$   Right Unilateral             '[]'$  Bilateral   % Energy : 0.3 ms 40%   Impedance: 1760 ohms  Seizure Energy Index: 17,036 V squared  Postictal Suppression Index: 83%  Seizure Concordance Index: 98%  Medications  Pre Shock: Robinul 0.2 mg Brevital 100 mg succinylcholine 150 mg labetalol 10 mg  Post Shock:    Seizure Duration: EMG 18 seconds EEG 48 seconds   Comments: Follow-up Monday  Lungs:  '[x]'$   Clear to auscultation               '[]'$  Other:   Heart:    '[x]'$   Regular rhythm             '[]'$  irregular rhythm    '[x]'$   Previous H&P reviewed, patient examined and there are NO CHANGES                 '[]'$   Previous H&P reviewed, patient examined and there are changes noted.   [name], MD 10/13/20234:16 PM

## 2022-02-01 NOTE — Anesthesia Preprocedure Evaluation (Signed)
Anesthesia Evaluation  Patient identified by MRN, date of birth, ID band Patient awake    Reviewed: Allergy & Precautions, NPO status , Patient's Chart, lab work & pertinent test results  Airway Mallampati: II  TM Distance: >3 FB     Dental  (+) Teeth Intact   Pulmonary asthma , sleep apnea and Continuous Positive Airway Pressure Ventilation , Current Smoker,     + decreased breath sounds      Cardiovascular Exercise Tolerance: Good hypertension, Pt. on medications  Rhythm:Regular Rate:Normal     Neuro/Psych Anxiety Depression Bipolar Disorder Schizophrenia negative neurological ROS     GI/Hepatic Neg liver ROS, GERD  ,  Endo/Other  diabetes, Type 2, Oral Hypoglycemic AgentsMorbid obesity  Renal/GU negative Renal ROS     Musculoskeletal   Abdominal (+) + obese,   Peds negative pediatric ROS (+)  Hematology  (+) Blood dyscrasia, anemia ,   Anesthesia Other Findings   Reproductive/Obstetrics                             Anesthesia Physical  Anesthesia Plan  ASA: 3  Anesthesia Plan:    Post-op Pain Management:    Induction: Intravenous  PONV Risk Score and Plan:   Airway Management Planned: Natural Airway and Mask  Additional Equipment:   Intra-op Plan:   Post-operative Plan:   Informed Consent: I have reviewed the patients History and Physical, chart, labs and discussed the procedure including the risks, benefits and alternatives for the proposed anesthesia with the patient or authorized representative who has indicated his/her understanding and acceptance.       Plan Discussed with: CRNA and Surgeon  Anesthesia Plan Comments:        Anesthesia Quick Evaluation

## 2022-02-01 NOTE — Transfer of Care (Signed)
Immediate Anesthesia Transfer of Care Note  Patient: Susan Tanner  Procedure(s) Performed: ECT TX  Patient Location: PACU  Anesthesia Type:General  Level of Consciousness: drowsy  Airway & Oxygen Therapy: Patient Spontanous Breathing and Patient connected to nasal cannula oxygen  Post-op Assessment: Report given to RN  Post vital signs: stable  Last Vitals:  Vitals Value Taken Time  BP    Temp    Pulse 64 02/01/22 1145  Resp 23 02/01/22 1145  SpO2 91 % 02/01/22 1145  Vitals shown include unvalidated device data.  Last Pain:  Vitals:   02/01/22 1103  TempSrc: Oral  PainSc:          Complications: No notable events documented.

## 2022-02-01 NOTE — H&P (Signed)
Susan Tanner is an 41 y.o. female.   Chief Complaint: Patient is finally starting to feel like she is doing better HPI: Memory impairment as would be typically expected  Past Medical History:  Diagnosis Date   Anemia    h/o   Anxiety    Asthma    well controlled   Bipolar 1 disorder ([place])    Cardiomyopathy, dilated ([place] )    Complication of anesthesia    COVID-19    Depression    Diabetes mellitus without complication ([place])    Endometriosis    Family history of adverse reaction to anesthesia    half-brother-n/v,another brother hard to wake up   Frequent PVCs    GERD (gastroesophageal reflux disease)    Heart abnormality    History of methicillin resistant staphylococcus aureus (MRSA)    Hyperlipidemia    Hypertension    Morbid obesity ([place])    PONV (postoperative nausea and vomiting)    PTSD (post-traumatic stress disorder)    Vulvar intraepithelial neoplasia (VIN) grade 3     Past Surgical History:  Procedure Laterality Date   ABDOMINAL HYSTERECTOMY     APPENDECTOMY     TUBAL LIGATION      Family History  Problem Relation Age of Onset   Bipolar disorder Brother    Drug abuse Brother    Schizophrenia Maternal Uncle    Diabetes Maternal Grandfather    Diabetes Paternal Grandfather    Cancer Cousin    Brain cancer Cousin    Social History:  reports that she has been smoking cigars. She has never used smokeless tobacco. She reports current alcohol use. She reports current drug use. Drug: Marijuana.  Allergies:  Allergies  Allergen Reactions   Depakote Er [Divalproex Sodium Er] Other (See Comments)    Affects liver enzymes   Cefuroxime Axetil Other (See Comments)    Muscle pain   Loratadine-Pseudoephedrine Er     Muscle pain. Other reaction(s): Muscle Pain   Percocet [Oxycodone-Acetaminophen] Itching   Valproic Acid     Other reaction(s): Liver Disorder "affects liver" Other reaction(s): Liver Disorder    Ziprasidone Hcl Other (See Comments)    Inflames  her liver, elevated LFTs Other reaction(s): Liver Disorder   Cariprazine Palpitations   Lamictal [Lamotrigine] Rash   Tape Rash    Ok to [place] paper tape    (Not in a hospital admission)   No results found for this or any previous visit (from the past 48 hour(s)). No results found.  Review of Systems  Constitutional: Negative.   HENT: Negative.    Eyes: Negative.   Respiratory: Negative.    Cardiovascular: Negative.   Gastrointestinal: Negative.   Musculoskeletal: Negative.   Skin: Negative.   Neurological: Negative.   Psychiatric/Behavioral: Negative.      Blood pressure (!) 172/98, pulse 73, temperature 98.7 F (37.1 C), temperature source Oral, resp. rate 18, SpO2 95 %. Physical Exam Vitals reviewed.  Constitutional:      Appearance: She is well-developed.  HENT:     Head: Normocephalic and atraumatic.  Eyes:     Conjunctiva/sclera: Conjunctivae normal.     Pupils: Pupils are equal, round, and reactive to light.  Cardiovascular:     Heart sounds: Normal heart sounds.  Pulmonary:     Effort: Pulmonary effort is normal.  Abdominal:     Palpations: Abdomen is soft.  Musculoskeletal:        General: Normal range of motion.     Cervical back: Normal range  of motion.  Skin:    General: Skin is warm and dry.  Neurological:     General: No focal deficit present.     Mental Status: She is alert.  Psychiatric:        Mood and Affect: Mood normal.        Behavior: Behavior normal.        Thought Content: Thought content normal.      Assessment/Plan Treatment today and plan on at least having a follow-up treatment on Friday to make 6 total.  [name], MD 02/01/2022, 4:15 PM

## 2022-02-03 ENCOUNTER — Other Ambulatory Visit: Payer: Self-pay | Admitting: Psychiatry

## 2022-02-03 DIAGNOSIS — R200 Anesthesia of skin: Secondary | ICD-10-CM

## 2022-02-04 ENCOUNTER — Encounter (HOSPITAL_BASED_OUTPATIENT_CLINIC_OR_DEPARTMENT_OTHER)
Admission: RE | Admit: 2022-02-04 | Discharge: 2022-02-04 | Disposition: A | Discharge status: Home / Self Care | Payer: Medicare Other | Source: Ambulatory Visit | Attending: Nurse Practitioner | Admitting: Nurse Practitioner

## 2022-02-04 VITALS — BP 132/82 | HR 93 | Temp 98.1°F | Resp 17 | Ht 65.0 in | Wt 248.0 lb

## 2022-02-04 DIAGNOSIS — F332 Major depressive disorder, recurrent severe without psychotic features: Secondary | ICD-10-CM

## 2022-02-04 DIAGNOSIS — R200 Anesthesia of skin: Secondary | ICD-10-CM

## 2022-02-04 LAB — GLUCOSE, CAPILLARY
Glucose-Capillary: 116 mg/dL — ABNORMAL HIGH (ref 70–99)
Glucose-Capillary: 196 mg/dL — ABNORMAL HIGH (ref 70–99)

## 2022-02-04 MED ORDER — LABETALOL HCL 5 MG/ML IV SOLN
INTRAVENOUS | Status: DC | PRN
  Administered 2022-02-04: 20 mg via INTRAVENOUS

## 2022-02-04 MED ORDER — DEXTROSE 5 % IV SOLN
500.0000 mL | Freq: Once | INTRAVENOUS | Status: AC
  Administered 2022-02-04: 500 mL via INTRAVENOUS

## 2022-02-04 MED ORDER — GLYCOPYRROLATE 0.2 MG/ML IJ SOLN: 0.2000 mg | Freq: Once | INTRAMUSCULAR | Status: AC

## 2022-02-04 MED ORDER — ONDANSETRON HCL 4 MG/2ML IJ SOLN
INTRAMUSCULAR | Status: AC
  Administered 2022-02-04: 4 mg via INTRAVENOUS
  Filled 2022-02-04: qty 2

## 2022-02-04 MED ORDER — KETOROLAC TROMETHAMINE 30 MG/ML IJ SOLN
INTRAMUSCULAR | Status: AC
  Administered 2022-02-04: 30 mg via INTRAVENOUS
  Filled 2022-02-04: qty 1

## 2022-02-04 MED ORDER — LIDOCAINE HCL (CARDIAC) PF 100 MG/5ML IV SOSY
PREFILLED_SYRINGE | INTRAVENOUS | Status: DC | PRN
  Administered 2022-02-04: 100 mg via INTRAVENOUS

## 2022-02-04 MED ORDER — SUCCINYLCHOLINE CHLORIDE 200 MG/10ML IV SOSY
PREFILLED_SYRINGE | INTRAVENOUS | Status: DC | PRN
  Administered 2022-02-04: 200 mg via INTRAVENOUS

## 2022-02-04 MED ORDER — SODIUM CHLORIDE 0.9 % IV SOLN: 500.0000 mL | Freq: Once | INTRAVENOUS | Status: DC

## 2022-02-04 MED ORDER — GLYCOPYRROLATE 0.2 MG/ML IJ SOLN: 0.2000 mg | Freq: Once | INTRAMUSCULAR | Status: DC

## 2022-02-04 MED ORDER — ONDANSETRON HCL 4 MG/2ML IJ SOLN: 4.0000 mg | Freq: Once | INTRAMUSCULAR | Status: AC

## 2022-02-04 MED ORDER — GLYCOPYRROLATE 0.2 MG/ML IJ SOLN
INTRAMUSCULAR | Status: AC
  Administered 2022-02-04: 0.2 mg via INTRAVENOUS
  Filled 2022-02-04: qty 1

## 2022-02-04 MED ORDER — SODIUM CHLORIDE 0.9 % IV SOLN: 500.0000 mL | Freq: Once | INTRAVENOUS | Status: AC

## 2022-02-04 MED ORDER — METHOHEXITAL SODIUM 100 MG/10ML IV SOSY
PREFILLED_SYRINGE | INTRAVENOUS | Status: DC | PRN
  Administered 2022-02-04: 100 mg via INTRAVENOUS

## 2022-02-04 MED ORDER — KETOROLAC TROMETHAMINE 30 MG/ML IJ SOLN: 30.0000 mg | Freq: Once | INTRAMUSCULAR | Status: DC

## 2022-02-04 MED ORDER — ONDANSETRON HCL 4 MG/2ML IJ SOLN: 4.0000 mg | Freq: Once | INTRAMUSCULAR | Status: DC

## 2022-02-04 MED ORDER — MIDAZOLAM HCL 2 MG/2ML IJ SOLN
INTRAMUSCULAR | Status: AC
  Filled 2022-02-04: qty 2

## 2022-02-04 MED ORDER — MIDAZOLAM HCL 2 MG/2ML IJ SOLN
INTRAMUSCULAR | Status: DC | PRN
  Administered 2022-02-04: 2 mg via INTRAVENOUS

## 2022-02-04 MED ORDER — KETOROLAC TROMETHAMINE 30 MG/ML IJ SOLN: 30.0000 mg | Freq: Once | INTRAMUSCULAR | Status: AC

## 2022-02-04 NOTE — Anesthesia Preprocedure Evaluation (Signed)
Anesthesia Evaluation  Patient identified by MRN, date of birth, ID band Patient awake, Patient confused and Patient unresponsive    Reviewed: Allergy & Precautions, NPO status , Patient's Chart, lab work & pertinent test results  History of Anesthesia Complications (+) PONV and history of anesthetic complications  Airway Mallampati: II  TM Distance: >3 FB Neck ROM: full    Dental  (+) Teeth Intact   Pulmonary neg pulmonary ROS, asthma , sleep apnea , Current Smoker   Pulmonary exam normal  + decreased breath sounds      Cardiovascular Exercise Tolerance: Good hypertension, Pt. on medications negative cardio ROS Normal cardiovascular exam Rhythm:Regular     Neuro/Psych   Anxiety Depression Bipolar Disorder Schizophrenia  negative neurological ROS  negative psych ROS   GI/Hepatic negative GI ROS, Neg liver ROS,GERD  Medicated,,  Endo/Other  negative endocrine ROSdiabetes, Type 2, Oral Hypoglycemic Agents  Morbid obesity  Renal/GU negative Renal ROS  negative genitourinary   Musculoskeletal   Abdominal  (+) + obese  Peds negative pediatric ROS (+)  Hematology negative hematology ROS (+) Blood dyscrasia, anemia   Anesthesia Other Findings Past Medical History: No date: Anemia     Comment:  h/o No date: Anxiety No date: Asthma     Comment:  well controlled No date: Bipolar 1 disorder (HCC) No date: Cardiomyopathy, 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[place]) No date: Complication of anesthesia No date: COVID-19 No date: Depression No date: Diabetes mellitus without complication (HCC) No date: Endometriosis No date: Family history of adverse reaction to anesthesia     Comment:  half-brother-n/v,another brother hard to wake up No date: Frequent PVCs No date: GERD (gastroesophageal reflux disease) No date: Heart abnormality No date: History of methicillin resistant staphylococcus aureus (MRSA) No date: Hyperlipidemia No date:  Hypertension No date: Morbid obesity ([place]) No date: PONV (postoperative nausea and vomiting) No date: PTSD (post-traumatic stress disorder) No date: Vulvar intraepithelial neoplasia (VIN) grade 3  Past Surgical History: No date: ABDOMINAL HYSTERECTOMY No date: APPENDECTOMY No date: TUBAL LIGATION  BMI    Body Mass Index: 41.27 kg/m      Reproductive/Obstetrics negative OB ROS                             Anesthesia Physical Anesthesia Plan  ASA: 3  Anesthesia Plan: General   Post-op Pain Management:    Induction: Intravenous  PONV Risk Score and Plan: Propofol infusion and TIVA  Airway Management Planned: Natural Airway and Mask  Additional Equipment:   Intra-op Plan:   Post-operative Plan:   Informed Consent: I have reviewed the patients History and Physical, chart, labs and discussed the procedure including the risks, benefits and alternatives for the proposed anesthesia with the patient or authorized representative who has indicated his/her understanding and acceptance.     Dental Advisory Given  Plan Discussed with: CRNA and Surgeon  Anesthesia Plan Comments:        Anesthesia Quick Evaluation

## 2022-02-04 NOTE — H&P (Signed)
Susan Tanner is an 41 y.o. female.   Chief Complaint: Patient has no new complaint.  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[place])    Complication of anesthesia    COVID-19    Depression    Diabetes mellitus without complication ([place])    Endometriosis    Family history of adverse reaction to anesthesia    half-brother-n/v,another brother hard to wake up   Frequent PVCs    GERD (gastroesophageal reflux disease)    Heart abnormality    History of methicillin resistant staphylococcus aureus (MRSA)    Hyperlipidemia    Hypertension    Morbid obesity (HCC)    PONV (postoperative nausea and vomiting)    PTSD (post-traumatic stress disorder)    Vulvar intraepithelial neoplasia (VIN) grade 3     Past Surgical History:  Procedure Laterality Date   ABDOMINAL HYSTERECTOMY     APPENDECTOMY     TUBAL LIGATION      Family History  Problem Relation Age of Onset   Bipolar disorder Brother    Drug abuse Brother    Schizophrenia Maternal Uncle    Diabetes Maternal Grandfather    Diabetes Paternal Grandfather    Cancer Cousin    Brain cancer Cousin    Social History:  reports that she has been smoking cigars. She has never used smokeless tobacco. She reports current alcohol use. She reports current drug use. Drug: Marijuana.  Allergies:  Allergies  Allergen Reactions   Depakote Er [Divalproex Sodium Er] Other (See Comments)    Affects liver enzymes   Cefuroxime Axetil Other (See Comments)    Muscle pain   Loratadine-Pseudoephedrine Er     Muscle pain. Other reaction(s): Muscle Pain   Percocet [Oxycodone-Acetaminophen] Itching   Valproic Acid     Other reaction(s): Liver Disorder "affects liver" Other reaction(s): Liver Disorder    Ziprasidone Hcl Other (See Comments)    Inflames her liver,  elevated LFTs Other reaction(s): Liver Disorder   Cariprazine Palpitations   Lamictal [Lamotrigine] Rash   Tape Rash    Ok to [place] paper tape    (Not in a hospital admission)   Results for orders placed or performed during the hospital encounter of 02/04/22 (from the past 48 hour(s))  Glucose, capillary     Status: Abnormal   Collection Time: 02/04/22 12:17 PM  Result Value Ref Range   Glucose-Capillary 116 (H) 70 - 99 mg/dL    Comment: Glucose reference range applies only to samples taken after fasting for at least 8 hours.  Glucose, capillary     Status: Abnormal   Collection Time: 02/04/22  1:38 PM  Result Value Ref Range   Glucose-Capillary 196 (H) 70 - 99 mg/dL    Comment: Glucose reference range applies only to samples taken after fasting for at least 8 hours.   No results found.  Review of Systems  Constitutional: Negative.   HENT: Negative.    Eyes: Negative.   Respiratory: Negative.    Cardiovascular: Negative.   Gastrointestinal: Negative.   Musculoskeletal: Negative.   Skin: Negative.   Neurological: Negative.   Psychiatric/Behavioral: Negative.      Blood pressure 132/82, pulse 93, temperature 98.1 F (36.7 C), resp. rate 17, height '5\' 5"'$  (1.651 m), weight 112.5  kg, SpO2 93 %. Physical Exam Nursing note reviewed.  Constitutional:      Appearance: She is well-developed.  HENT:     Head: Normocephalic and atraumatic.  Eyes:     Conjunctiva/sclera: Conjunctivae normal.     Pupils: Pupils are equal, round, and reactive to light.  Cardiovascular:     Heart sounds: Normal heart sounds.  Pulmonary:     Effort: Pulmonary effort is normal.  Abdominal:     Palpations: Abdomen is soft.  Musculoskeletal:        General: Normal range of motion.     Cervical back: Normal range of motion.  Skin:    General: Skin is warm and dry.  Neurological:     General: No focal deficit present.     Mental Status: She is alert.  Psychiatric:        Mood and Affect: Mood  normal.        Thought Content: Thought content normal.      Assessment/Plan Has done well completing ECT course finished today probably follow-up 3 weeks  [name], MD 02/04/2022, 7:08 PM

## 2022-02-04 NOTE — Anesthesia Procedure Notes (Signed)
Procedure Name: General with mask airway Date/Time: 02/04/2022 1:07 PM  Performed by: Lily Peer, Mimie Goering, CRNAPre-anesthesia Checklist: Patient identified, Emergency Drugs available, Suction available, Patient being monitored and Timeout performed Patient Re-evaluated:Patient Re-evaluated prior to induction Oxygen Delivery Method: Circle system utilized Preoxygenation: Pre-oxygenation with 100% oxygen Induction Type: IV induction Ventilation: Mask ventilation throughout procedure

## 2022-02-04 NOTE — Procedures (Signed)
ECT SERVICES Physician's Interval Evaluation & Treatment Note  Patient Identification: Susan Tanner MRN:  [id number] Date of Evaluation:  02/04/2022 TX #: 6  MADRS:   MMSE:   P.E. Findings:  No change to physical exam  Psychiatric Interval Note:  Mood overall stable and improved  Subjective:  Patient is a 41 y.o. female seen for evaluation for Electroconvulsive Therapy. Not feeling very depressed  Treatment Summary:   '[x]'$   Right Unilateral             '[]'$  Bilateral   % Energy : 0.3 ms 40%   Impedance: 2230 ohms  Seizure Energy Index: 18,749 V squared  Postictal Suppression Index: 15%  Seizure Concordance Index: 97%  Medications  Pre Shock: Brevital 100 mg succinylcholine 150 mg labetalol 10 mg Robinul 0.2 mg  Post Shock:    Seizure Duration: 23 seconds EMG 69 seconds EEG   Comments: Last treatment of index course follow-up in 3 weeks  Lungs:  '[x]'$   Clear to auscultation               '[]'$  Other:   Heart:    '[]'$   Regular rhythm             '[]'$  irregular rhythm    '[x]'$   Previous H&P reviewed, patient examined and there are NO CHANGES                 '[x]'$   Previous H&P reviewed, patient examined and there are changes noted.   Susan Berthold, MD 10/16/20237:09 PM  6

## 2022-02-04 NOTE — Transfer of Care (Signed)
Immediate Anesthesia Transfer of Care Note  Patient: Susan Tanner  Procedure(s) Performed: ECT TX  Patient Location: PACU  Anesthesia Type:General  Level of Consciousness: drowsy  Airway & Oxygen Therapy: Patient Spontanous Breathing and Patient connected to face mask oxygen  Post-op Assessment: Report given to RN and Post -op Vital signs reviewed and stable  Post vital signs: Reviewed and stable  Last Vitals:  Vitals Value Taken Time  BP 155/107 02/04/22 1325  Temp 37.3 C 02/04/22 1325  Pulse 95 02/04/22 1327  Resp 23 02/04/22 1327  SpO2 95 % 02/04/22 1327  Vitals shown include unvalidated device data.  Last Pain:  Vitals:   02/04/22 1137  TempSrc: Oral  PainSc: 0-No pain         Complications: No notable events documented.

## 2022-02-06 ENCOUNTER — Inpatient Hospital Stay: Payer: Medicare Other | Attending: Obstetrics and Gynecology | Admitting: Obstetrics and Gynecology

## 2022-02-06 VITALS — BP 165/119 | HR 86 | Temp 98.0°F | Wt 243.0 lb

## 2022-02-06 DIAGNOSIS — N393 Stress incontinence (female) (male): Secondary | ICD-10-CM | POA: Diagnosis not present

## 2022-02-06 DIAGNOSIS — D071 Carcinoma in situ of vulva: Secondary | ICD-10-CM | POA: Insufficient documentation

## 2022-02-06 DIAGNOSIS — Z87891 Personal history of nicotine dependence: Secondary | ICD-10-CM | POA: Diagnosis not present

## 2022-02-06 DIAGNOSIS — Z6841 Body Mass Index (BMI) 40.0 and over, adult: Secondary | ICD-10-CM | POA: Insufficient documentation
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
## 2022-02-08 ENCOUNTER — Telehealth: Payer: Self-pay

## 2022-02-08 ENCOUNTER — Other Ambulatory Visit: Payer: Self-pay | Admitting: Psychiatry

## 2022-02-11 NOTE — Anesthesia Postprocedure Evaluation (Signed)
Anesthesia Post Note  Patient: Susan Tanner  Procedure(s) Performed: ECT TX  Patient location during evaluation: PACU Anesthesia Type: General Level of consciousness: awake and oriented Pain management: pain level controlled Vital Signs Assessment: post-procedure vital signs reviewed and stable Respiratory status: spontaneous breathing and respiratory function stable Cardiovascular status: stable Anesthetic complications: no   No notable events documented.   Last Vitals:  Vitals:   02/04/22 1350 02/04/22 1400  BP: 133/85 132/82  Pulse: 88 93  Resp: (!) 23 17  Temp:  36.7 C  SpO2: 90% 93%    Last Pain:  Vitals:   02/04/22 1400  TempSrc:   PainSc: 0-No pain                 VAN STAVEREN,Rudolph Daoust

## 2022-02-13 ENCOUNTER — Other Ambulatory Visit: Payer: Self-pay | Admitting: Psychiatry

## 2022-02-14 ENCOUNTER — Other Ambulatory Visit: Payer: Self-pay | Admitting: Psychiatry

## 2022-02-14 MED ORDER — ESZOPICLONE 3 MG PO TABS: 3.0000 mg | ORAL_TABLET | Freq: Every evening | ORAL | 0 refills | Status: DC | PRN

## 2022-02-18 ENCOUNTER — Encounter (INDEPENDENT_AMBULATORY_CARE_PROVIDER_SITE_OTHER): Payer: Self-pay

## 2022-02-23 ENCOUNTER — Other Ambulatory Visit: Payer: Self-pay | Admitting: Psychiatry

## 2022-02-23 DIAGNOSIS — R200 Anesthesia of skin: Secondary | ICD-10-CM

## 2022-02-25 ENCOUNTER — Inpatient Hospital Stay: Admission: RE | Admit: 2022-02-25 | Payer: Medicare Other | Source: Ambulatory Visit

## 2022-02-25 ENCOUNTER — Encounter: Payer: Self-pay | Admitting: Certified Registered Nurse Anesthetist
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
## 2022-03-12 ENCOUNTER — Ambulatory Visit (INDEPENDENT_AMBULATORY_CARE_PROVIDER_SITE_OTHER): Payer: Medicare Other | Admitting: Psychiatry

## 2022-03-12 ENCOUNTER — Encounter: Payer: Self-pay | Admitting: Psychiatry

## 2022-03-12 VITALS — BP 114/77 | HR 72 | Temp 98.1°F | Ht 65.0 in | Wt 247.8 lb

## 2022-03-12 DIAGNOSIS — F4310 Post-traumatic stress disorder, unspecified: Secondary | ICD-10-CM | POA: Diagnosis not present

## 2022-03-12 DIAGNOSIS — G4700 Insomnia, unspecified: Secondary | ICD-10-CM | POA: Diagnosis not present

## 2022-03-12 DIAGNOSIS — F259 Schizoaffective disorder, unspecified: Secondary | ICD-10-CM

## 2022-03-12 MED ORDER — ALPRAZOLAM 0.5 MG PO TABS: 0.5000 mg | ORAL_TABLET | Freq: Every evening | ORAL | 1 refills | Status: AC | PRN

## 2022-03-12 MED ORDER — LURASIDONE HCL 80 MG PO TABS: 80.0000 mg | ORAL_TABLET | Freq: Every day | ORAL | 0 refills | Status: DC

## 2022-03-12 MED ORDER — RAMELTEON 8 MG PO TABS: 8.0000 mg | ORAL_TABLET | Freq: Every day | ORAL | 1 refills | Status: DC

## 2022-03-12 NOTE — Patient Instructions (Signed)
Continue sertraline 150 mg at night Continue Latuda 80 mg at night Continue hydroxyzine 25 mg as needed for anxiety Decrease lunesta 1.5 mg for 3 days, then discontinue Start ramelteon 8 mg at night  Continue Xanax 0.5 mg daily as needed for anxiety  (Hold lithium- discontinued by Dr. Weber Cooks) Next appointment- 1/25 at 1 PM

## 2022-03-14 ENCOUNTER — Other Ambulatory Visit: Payer: Self-pay

## 2022-03-14 ENCOUNTER — Observation Stay: Payer: Medicare Other

## 2022-03-14 ENCOUNTER — Observation Stay
Admission: EM | Admit: 2022-03-14 | Discharge: 2022-03-15 | Disposition: A | Discharge status: Home / Self Care | Payer: Medicare Other | Attending: Internal Medicine | Admitting: Internal Medicine

## 2022-03-14 DIAGNOSIS — R55 Syncope and collapse: Principal | ICD-10-CM

## 2022-03-14 DIAGNOSIS — E119 Type 2 diabetes mellitus without complications: Secondary | ICD-10-CM | POA: Diagnosis not present

## 2022-03-14 DIAGNOSIS — I420 Dilated cardiomyopathy: Secondary | ICD-10-CM

## 2022-03-14 DIAGNOSIS — R531 Weakness: Secondary | ICD-10-CM | POA: Insufficient documentation

## 2022-03-14 DIAGNOSIS — E860 Dehydration: Secondary | ICD-10-CM | POA: Diagnosis not present

## 2022-03-14 DIAGNOSIS — I110 Hypertensive heart disease with heart failure: Secondary | ICD-10-CM | POA: Diagnosis not present

## 2022-03-14 DIAGNOSIS — I10 Essential (primary) hypertension: Secondary | ICD-10-CM | POA: Diagnosis not present

## 2022-03-14 DIAGNOSIS — N179 Acute kidney failure, unspecified: Secondary | ICD-10-CM | POA: Diagnosis not present

## 2022-03-14 DIAGNOSIS — Z8616 Personal history of COVID-19: Secondary | ICD-10-CM | POA: Diagnosis not present

## 2022-03-14 DIAGNOSIS — Z7984 Long term (current) use of oral hypoglycemic drugs: Secondary | ICD-10-CM | POA: Insufficient documentation

## 2022-03-14 DIAGNOSIS — Z79899 Other long term (current) drug therapy: Secondary | ICD-10-CM | POA: Diagnosis not present

## 2022-03-14 DIAGNOSIS — Z1152 Encounter for screening for COVID-19: Secondary | ICD-10-CM | POA: Insufficient documentation

## 2022-03-14 DIAGNOSIS — K922 Gastrointestinal hemorrhage, unspecified: Secondary | ICD-10-CM | POA: Diagnosis not present

## 2022-03-14 DIAGNOSIS — F17290 Nicotine dependence, other tobacco product, uncomplicated: Secondary | ICD-10-CM | POA: Insufficient documentation

## 2022-03-14 DIAGNOSIS — J45909 Unspecified asthma, uncomplicated: Secondary | ICD-10-CM | POA: Diagnosis not present

## 2022-03-14 DIAGNOSIS — I509 Heart failure, unspecified: Secondary | ICD-10-CM | POA: Diagnosis not present

## 2022-03-14 DIAGNOSIS — R197 Diarrhea, unspecified: Secondary | ICD-10-CM

## 2022-03-14 DIAGNOSIS — I959 Hypotension, unspecified: Secondary | ICD-10-CM | POA: Diagnosis not present

## 2022-03-14 LAB — URINE DRUG SCREEN, QUALITATIVE (ARMC ONLY)
Amphetamines, Ur Screen: NOT DETECTED
Barbiturates, Ur Screen: NOT DETECTED
Benzodiazepine, Ur Scrn: POSITIVE — AB
Cannabinoid 50 Ng, Ur ~~LOC~~: POSITIVE — AB
Cocaine Metabolite,Ur ~~LOC~~: NOT DETECTED
MDMA (Ecstasy)Ur Screen: NOT DETECTED
Methadone Scn, Ur: NOT DETECTED
Opiate, Ur Screen: NOT DETECTED
Phencyclidine (PCP) Ur S: NOT DETECTED
Tricyclic, Ur Screen: NOT DETECTED

## 2022-03-14 LAB — LACTIC ACID, PLASMA: Lactic Acid, Venous: 2 mmol/L (ref 0.5–1.9)

## 2022-03-14 LAB — CBC WITH DIFFERENTIAL/PLATELET
Abs Immature Granulocytes: 0.05 10*3/uL (ref 0.00–0.07)
Basophils Absolute: 0.1 10*3/uL (ref 0.0–0.1)
Basophils Relative: 1 %
Eosinophils Absolute: 0.2 10*3/uL (ref 0.0–0.5)
Eosinophils Relative: 2 %
HCT: 33.7 % — ABNORMAL LOW (ref 36.0–46.0)
Hemoglobin: 11 g/dL — ABNORMAL LOW (ref 12.0–15.0)
Immature Granulocytes: 0 %
Lymphocytes Relative: 25 %
Lymphs Abs: 3.3 10*3/uL (ref 0.7–4.0)
MCH: 25.6 pg — ABNORMAL LOW (ref 26.0–34.0)
MCHC: 32.6 g/dL (ref 30.0–36.0)
MCV: 78.4 fL — ABNORMAL LOW (ref 80.0–100.0)
Monocytes Absolute: 1.2 10*3/uL — ABNORMAL HIGH (ref 0.1–1.0)
Monocytes Relative: 8 %
Neutro Abs: 8.8 10*3/uL — ABNORMAL HIGH (ref 1.7–7.7)
Neutrophils Relative %: 64 %
Platelets: 366 10*3/uL (ref 150–400)
RBC: 4.3 MIL/uL (ref 3.87–5.11)
RDW: 14.2 % (ref 11.5–15.5)
WBC: 13.6 10*3/uL — ABNORMAL HIGH (ref 4.0–10.5)
nRBC: 0 % (ref 0.0–0.2)

## 2022-03-14 LAB — RESP PANEL BY RT-PCR (FLU A&B, COVID) ARPGX2
Influenza A by PCR: NEGATIVE
Influenza B by PCR: NEGATIVE
SARS Coronavirus 2 by RT PCR: NEGATIVE

## 2022-03-14 LAB — COMPREHENSIVE METABOLIC PANEL
ALT: 26 U/L (ref 0–44)
AST: 27 U/L (ref 15–41)
Albumin: 3.8 g/dL (ref 3.5–5.0)
Alkaline Phosphatase: 49 U/L (ref 38–126)
Anion gap: 7 (ref 5–15)
BUN: 16 mg/dL (ref 6–20)
CO2: 23 mmol/L (ref 22–32)
Calcium: 8.1 mg/dL — ABNORMAL LOW (ref 8.9–10.3)
Chloride: 109 mmol/L (ref 98–111)
Creatinine, Ser: 1.04 mg/dL — ABNORMAL HIGH (ref 0.44–1.00)
GFR, Estimated: >60 mL/min (ref >60)
Glucose, Bld: 183 mg/dL — ABNORMAL HIGH (ref 70–99)
Potassium: 3.7 mmol/L (ref 3.5–5.1)
Sodium: 139 mmol/L (ref 135–145)
Total Bilirubin: 0.5 mg/dL (ref 0.3–1.2)
Total Protein: 6.5 g/dL (ref 6.5–8.1)

## 2022-03-14 LAB — URINALYSIS, ROUTINE W REFLEX MICROSCOPIC
Bilirubin Urine: NEGATIVE
Glucose, UA: NEGATIVE mg/dL
Hgb urine dipstick: NEGATIVE
Ketones, ur: NEGATIVE mg/dL
Leukocytes,Ua: NEGATIVE
Nitrite: NEGATIVE
Protein, ur: NEGATIVE mg/dL
Specific Gravity, Urine: 1.011 (ref 1.005–1.030)
pH: 5 (ref 5.0–8.0)

## 2022-03-14 LAB — CBG MONITORING, ED: Glucose-Capillary: 169 mg/dL — ABNORMAL HIGH (ref 70–99)

## 2022-03-14 LAB — TROPONIN I (HIGH SENSITIVITY)
Troponin I (High Sensitivity): <2 ng/L (ref <18)
Troponin I (High Sensitivity): 3 ng/L (ref <18)

## 2022-03-14 LAB — BRAIN NATRIURETIC PEPTIDE: B Natriuretic Peptide: 16.6 pg/mL (ref 0.0–100.0)

## 2022-03-14 MED ORDER — LURASIDONE HCL 40 MG PO TABS
80.0000 mg | ORAL_TABLET | Freq: Every day | ORAL | Status: DC
  Administered 2022-03-15: 80 mg via ORAL
  Filled 2022-03-14: qty 2

## 2022-03-14 MED ORDER — ONDANSETRON HCL 4 MG PO TABS: 4.0000 mg | ORAL_TABLET | Freq: Four times a day (QID) | ORAL | Status: DC | PRN

## 2022-03-14 MED ORDER — ROSUVASTATIN CALCIUM 5 MG PO TABS: 5.0000 mg | ORAL_TABLET | Freq: Every day | ORAL | Status: DC

## 2022-03-14 MED ORDER — ENOXAPARIN SODIUM 60 MG/0.6ML IJ SOSY: 0.5000 mg/kg | PREFILLED_SYRINGE | INTRAMUSCULAR | Status: DC

## 2022-03-14 MED ORDER — ONDANSETRON HCL 4 MG/2ML IJ SOLN: 4.0000 mg | Freq: Four times a day (QID) | INTRAMUSCULAR | Status: DC | PRN

## 2022-03-14 MED ORDER — INSULIN ASPART 100 UNIT/ML IJ SOLN: 0.0000 [IU] | Freq: Three times a day (TID) | INTRAMUSCULAR | Status: DC

## 2022-03-14 MED ORDER — ACETAMINOPHEN 325 MG RE SUPP: 650.0000 mg | Freq: Four times a day (QID) | RECTAL | Status: DC | PRN

## 2022-03-14 MED ORDER — LACTATED RINGERS IV SOLN: INTRAVENOUS | Status: AC

## 2022-03-14 MED ORDER — ALPRAZOLAM 0.5 MG PO TABS: 0.5000 mg | ORAL_TABLET | Freq: Every day | ORAL | Status: DC | PRN

## 2022-03-14 MED ORDER — LACTATED RINGERS IV BOLUS
1000.0000 mL | Freq: Once | INTRAVENOUS | Status: AC
  Administered 2022-03-14: 1000 mL via INTRAVENOUS

## 2022-03-14 MED ORDER — ALBUTEROL SULFATE HFA 108 (90 BASE) MCG/ACT IN AERS: 2.0000 | INHALATION_SPRAY | Freq: Four times a day (QID) | RESPIRATORY_TRACT | Status: DC | PRN

## 2022-03-14 MED ORDER — ENOXAPARIN SODIUM 40 MG/0.4ML IJ SOSY: 40.0000 mg | PREFILLED_SYRINGE | INTRAMUSCULAR | Status: DC

## 2022-03-14 MED ORDER — SERTRALINE HCL 50 MG PO TABS
150.0000 mg | ORAL_TABLET | Freq: Every day | ORAL | Status: DC
  Administered 2022-03-14: 150 mg via ORAL
  Filled 2022-03-14: qty 3

## 2022-03-14 MED ORDER — ACETAMINOPHEN 325 MG PO TABS: 650.0000 mg | ORAL_TABLET | Freq: Four times a day (QID) | ORAL | Status: DC | PRN

## 2022-03-14 MED ORDER — PANTOPRAZOLE SODIUM 40 MG PO TBEC
40.0000 mg | DELAYED_RELEASE_TABLET | ORAL | Status: DC
  Administered 2022-03-15: 40 mg via ORAL
  Filled 2022-03-14: qty 1

## 2022-03-14 MED ORDER — ADULT MULTIVITAMIN W/MINERALS CH
1.0000 | ORAL_TABLET | Freq: Every day | ORAL | Status: DC
  Administered 2022-03-14 – 2022-03-15 (×2): 1 via ORAL
  Filled 2022-03-14 (×2): qty 1

## 2022-03-14 MED ORDER — SODIUM CHLORIDE 0.9% FLUSH
3.0000 mL | Freq: Two times a day (BID) | INTRAVENOUS | Status: DC
  Administered 2022-03-15: 3 mL via INTRAVENOUS

## 2022-03-14 NOTE — Assessment & Plan Note (Addendum)
Patient presents after a syncopal episode with significant hypotension.  Differential includes adverse drug reaction VS severe hypotension (in the setting of recent increase in antihypertensives +/- dehydration 2/2 diarrhea).  Lower on the differential is seizure especially since there is no urinary/bowel incontinence, tongue biting or postictal state.  WBC is elevated the patient has been experiencing diarrhea, however clinical picture is inconsistent with septic shock.  No electrolyte abnormalities to explain symptoms, EKG is reassuring and troponin are negative x 2.  Given patient's history of dilated cardiomyopathy, will obtain an echocardiogram to ensure no changes that may be contributing.  - Hold home antihypertensives - Telemetry monitoring - Echocardiogram ordered - Discussed abstaining from further marjiuana/Delta 9 use

## 2022-03-14 NOTE — Progress Notes (Signed)
PHARMACIST - PHYSICIAN COMMUNICATION  CONCERNING:  Enoxaparin (Lovenox) for DVT Prophylaxis    RECOMMENDATION: Patient was prescribed enoxaprin '40mg'$  q24 hours for VTE prophylaxis.   Filed Weights   03/14/22 1712  Weight: 112 kg (247 lb)    Body mass index is 41.1 kg/m.  Estimated Creatinine Clearance: 90.6 mL/min (A) (by C-G formula based on SCr of 1.04 mg/dL (H)).   Based on [place] patient is candidate for enoxaparin 0.'5mg'$ /kg TBW SQ every 24 hours based on BMI being >30.   DESCRIPTION: Pharmacy has adjusted enoxaparin dose per [facility] policy.  Patient is now receiving enoxaparin 55 mg every 24 hours    Darrick Penna, PharmD Clinical Pharmacist  03/14/2022 8:01 PM

## 2022-03-14 NOTE — ED Notes (Signed)
Pt given 2 warm blankets as requested.

## 2022-03-14 NOTE — Assessment & Plan Note (Signed)
-   Hold home antihypertensives as noted above

## 2022-03-14 NOTE — ED Notes (Addendum)
Visitor to bedside. Pt in NSR currently on monitor. Denies CP, HA or any other pain. Denies hitting head when nearly passing out in chair at house today. Reports weakness and nausea at times. Pt's resp reg/unlabored, skin dry and calmly laying on stretcher. Pt has her glasses on. Reports did take a gummy today but nothing else. Smokes daily. ETOH every once in a while.

## 2022-03-14 NOTE — Assessment & Plan Note (Addendum)
Most recent ejection fraction of 45% approximately 6 months ago.  Given syncope today, will repeat echocardiogram to ensure no changes have occurred.  Patient is euvolemic on examination with BMP within normal limits.  - Echocardiogram pending

## 2022-03-14 NOTE — ED Notes (Signed)
Blood pulled off EMS IV by other RN during triage process.

## 2022-03-14 NOTE — Assessment & Plan Note (Signed)
Creatinine elevated on admission up to 1.04.  Previously approximately 0.7.  Etiology likely multifactorial.  Patient's lisinopril dose was recently increased from 10 mg to 40 and HCTZ was added.  In addition, patient has been having diarrhea.   - Continue IV fluids - Repeat BMP in the a.m. - Hold nephrotoxic agents

## 2022-03-14 NOTE — ED Triage Notes (Signed)
Pt in via EMS from home after 1 vomiting episode and nearly passing out; history of HTN and new BP meds; DM; asthma; axiety; today in with hypotension; BP 78/38 initially with EMS; EMS gave 700cc fluid bolus through 18g R fa IV they placed; HR 75 and 90% RA with EMS; 94% on 1L; 76/39 post first 500cc per EMS; BG 228 with EMS; 97.4 oral with EMS. Pt weak and nauseous. Pt A&Ox4 upon arrival to ER room.

## 2022-03-14 NOTE — ED Notes (Signed)
Pt reports several episodes of diarrhea for past 2 days.

## 2022-03-14 NOTE — Assessment & Plan Note (Addendum)
Patient's antihypertensives have recently been advanced due to persistent hypertension in the setting of ECT.  Approximately 3 weeks ago, amlodipine 10 mg was added.  Her lisinopril 10 mg was changed to lisinopril-HCTZ 40-25 mg.  Several days later, propranolol 80 mg was added.  It is possible that this is contributing to hypotension, especially in light of diarrhea that may have caused dehydration.  Lower on the differential is drug reaction to delta 8, however no studies demonstrating delta 8 has lead hypotension.  - Hold home antihypertensives - Continue IV fluids

## 2022-03-14 NOTE — Assessment & Plan Note (Signed)
-   Hold home metformin - A1c pending - SSI, moderate

## 2022-03-14 NOTE — ED Notes (Addendum)
Pt leaving for imaging. Pt reminded urine sample needed when able.

## 2022-03-14 NOTE — ED Provider Notes (Signed)
[facility] Provider Note    Event Date/Time   First MD Initiated Contact with Patient 03/14/22 1704     (approximate)   History   Chief Complaint Hypotension and Near Syncope   HPI  Susan Tanner is a 41 y.o. female with past medical history of hypertension, hyperlipidemia, diabetes, asthma, bipolar disorder, and schizoaffective disorder who presents to the ED complaining of syncope.  Patient reports that she was sitting at the dinner table with family eating Thanksgiving dinner when she began to feel dizzy and lightheaded.  She started to feel nauseous and vomited once, states it felt like there was something stuck in her throat.  She ended up passing out and states the next and she remembers is EMS arriving.  She denies any chest pain or shortness of breath with this episode, EMS does report that they found patient to be hypotensive on arrival.  Patient with initial BP of 78/38 and she was given 500 cc of IV fluids without significant improvement, after which a second 500 cc bag of IV fluids was started.  Patient denies any history of similar episodes but does state they have recently been increasing her blood pressure medications after she started ECT.  She reports feeling weak but otherwise well on arrival to the ED.     Physical Exam   Triage Vital Signs: ED Triage Vitals  Enc Vitals Group     BP --      Pulse --      Resp --      Temp 03/14/22 1711 98.2 F (36.8 C)     Temp Source 03/14/22 1711 Oral     SpO2 --      Weight --      Height --      Head Circumference --      Peak Flow --      Pain Score 03/14/22 1712 0     Pain Loc --      Pain Edu? --      Excl. in [place]? --     Most recent vital signs: Vitals:   03/14/22 1800 03/14/22 1830  BP: (!) 107/59 (!) 107/59  Pulse: 80 83  Resp: 15 17  Temp:    SpO2: 98% 100%    Constitutional: Alert and oriented. Eyes: Conjunctivae are normal. Head: Atraumatic. Nose: No  congestion/rhinnorhea. Mouth/Throat: Mucous membranes are moist.  Cardiovascular: Normal rate, regular rhythm. Grossly normal heart sounds.  2+ radial pulses bilaterally. Respiratory: Normal respiratory effort.  No retractions. Lungs CTAB. Gastrointestinal: Soft and nontender. No distention. Musculoskeletal: No lower extremity tenderness nor edema.  Neurologic:  Normal speech and language. No gross focal neurologic deficits are appreciated.    ED Results / Procedures / Treatments   Labs (all labs ordered are listed, but only abnormal results are displayed) Labs Reviewed  CBC WITH DIFFERENTIAL/PLATELET - Abnormal; Notable for the following components:      Result Value   WBC 13.6 (*)    Hemoglobin 11.0 (*)    HCT 33.7 (*)    MCV 78.4 (*)    MCH 25.6 (*)    Neutro Abs 8.8 (*)    Monocytes Absolute 1.2 (*)    All other components within normal limits  COMPREHENSIVE METABOLIC PANEL - Abnormal; Notable for the following components:   Glucose, Bld 183 (*)    Creatinine, Ser 1.04 (*)    Calcium 8.1 (*)    All other components within normal limits  CBG  MONITORING, ED - Abnormal; Notable for the following components:   Glucose-Capillary 169 (*)    All other components within normal limits  URINALYSIS, ROUTINE W REFLEX MICROSCOPIC  BRAIN NATRIURETIC PEPTIDE  LACTIC ACID, PLASMA  LACTIC ACID, PLASMA  TROPONIN I (HIGH SENSITIVITY)  TROPONIN I (HIGH SENSITIVITY)     EKG  ED ECG REPORT I, Blake Divine, the attending physician, personally viewed and interpreted this ECG.   Date: 03/14/2022  EKG Time: 17:10  Rate: 76  Rhythm: normal sinus rhythm  Axis: Normal  Intervals:none  ST&T Change: None  PROCEDURES:  Critical Care performed: No  Procedures   MEDICATIONS ORDERED IN ED: Medications  lactated ringers bolus 1,000 mL (1,000 mLs Intravenous New Bag/Given 03/14/22 1756)     IMPRESSION / MDM / ASSESSMENT AND PLAN / ED COURSE  I reviewed the triage vital signs and  the nursing notes.                              41 y.o. female with past medical history of hypertension, hyperlipidemia, diabetes, dilated cardiomyopathy, bipolar disorder, asthma, and schizoaffective disorder who presents to the ED complaining of dizziness and nausea followed by syncopal episode at home while eating dinner this evening.  Patient's presentation is most consistent with acute presentation with potential threat to life or bodily function.  Differential diagnosis includes, but is not limited to, arrhythmia, ACS, dehydration, electrolyte abnormality, AKI, anemia, vasovagal episode, medication effect.  Patient nontoxic-appearing and in no acute distress, vital signs remarkable for borderline hypotension at 103/60 but otherwise reassuring.  This number is improved following initial BP with EMS, will finish 500 cc IV fluid bag initiated by EMS for a total of 1 L of IV fluids.  EKG shows no evidence of arrhythmia or ischemia, patient is at slightly higher risk for cardiac etiology of syncope given her dilated cardiomyopathy with EF of 45 to 50% on last echocardiogram in June.  We will observe on cardiac monitor and check basic labs and troponin.  Patient is status post hysterectomy.  Would also consider vasovagal episode given association with nausea and vomiting.  Also consider medication effect with her recent increase in blood pressure medications.  Labs are reassuring with mild leukocytosis but no significant anemia, electrolyte abnormality, or AKI.  Blood pressure improved following additional IV fluids but remains borderline low, patient continues to report feeling weak.  Troponin within normal limits but cannot entirely exclude cardiac etiology for her episode.  She would also benefit from further monitoring of her blood pressure, case discussed with hospitalist for admission.      FINAL CLINICAL IMPRESSION(S) / ED DIAGNOSES   Final diagnoses:  Syncope, unspecified syncope type   Hypotension, unspecified hypotension type     Rx / DC Orders   ED Discharge Orders     None        Note:  This document was prepared using Dragon voice recognition software and may include unintentional dictation errors.   Blake Divine, MD 03/14/22 [phone]

## 2022-03-14 NOTE — H&P (Signed)
History and Physical    Patient: Susan Tanner NIO:[id number] DOB: 1980-08-02 DOA: 03/14/2022 DOS: the patient was seen and examined on 03/14/2022 PCP: Sallee Lange, NP  Patient coming from: Home  Chief Complaint:  Chief Complaint  Patient presents with   Hypotension   Near Syncope   HPI: Susan Tanner is a 41 y.o. female with medical history significant of dilated cardiomyopathy with most recent EF of 40%, hypertension, type 2 diabetes, depression and bipolar 1 on ECT, endometriosis, asthma, who presents to the ED with syncope.  Susan Tanner states that for the past 2 days, she has been experiencing nonbloody, nonmelanotic diarrhea.  Otherwise she has felt at her baseline.  This morning, she took her blood pressure and noted that her blood pressure was around 130/90.  Given that it was closer to her normal range, she only took 1 tablet of lisinopril-HCTZ rather than 2.  In addition, she took propranolol.  She usually takes her amlodipine at nighttime.  A few hours later, took 1 delta 8 gummy and subsequently smoked marijuana as well.  Shortly thereafter, she was eating dinner with her family and the next thing she remembers is coming to with her family surrounding her.    Susan Tanner husband states that he noticed Susan Tanner hand was shaking when she was pushing her glass.  She subsequently fell back into her chair and her eyes rolled back.  A family member was able to catch her chair before it fell completely to the ground.  At that time, Susan Tanner was unresponsive to those around her.  She began to throw up.  A few seconds later, she returned back to baseline.  Susan Tanner denies any urinary incontinence, bowel incontinence or tongue biting.  Mr. Champagne denies seeing any other shaking movements of the arm or any other extremity.  Overall, Mrs. Schemm denies any fever, chills, malaise, chest pain, shortness of breath, palpitations.  She endorses generalized weakness that has only been present  since the episode earlier.  Per EMS report, blood pressure was 78/38 with minimal improvement after 500 cc bolus.  She was saturating at 90% on room air with complaints of nausea and weakness.  ED course: On arrival to the ED, patient's blood pressure was 103/60 and repeat was 97/59.  She was saturating at 98% on room air.  Heart rate between 78 and 80.  Initial workup remarkable for creatinine of 1.04, WBC of 13.6, hemoglobin of 11.  EKG with sinus rhythm.  Initial troponin negative.  TRH contacted for admission for presyncope.  Review of Systems: As mentioned in the history of present illness. All other systems reviewed and are negative.  Past Medical History:  Diagnosis Date   Anemia    h/o   Anxiety    Asthma    well controlled   Bipolar 1 disorder [place]    Cardiomyopathy, dilated ([place])    Complication of anesthesia    COVID-19    Depression    Diabetes mellitus without complication ([place])    Endometriosis    Family history of adverse reaction to anesthesia    half-brother-n/v,another brother hard to wake up   Frequent PVCs    GERD (gastroesophageal reflux disease)    Heart abnormality    History of methicillin resistant staphylococcus aureus (MRSA)    Hyperlipidemia    Hypertension    Morbid obesity (H[facility])    PONV (postoperative nausea and vomiting)    PTSD (post-traumatic stress disorder)  Vulvar intraepithelial neoplasia (VIN) grade 3    Past Surgical History:  Procedure Laterality Date   ABDOMINAL HYSTERECTOMY     APPENDECTOMY     TUBAL LIGATION     Social History:  reports that she has been smoking cigars. She has never used smokeless tobacco. She reports current alcohol use. She reports current drug use. Drug: Marijuana.  Allergies  Allergen Reactions   Depakote Er [Divalproex Sodium Er] Other (See Comments)    Affects liver enzymes   Cefuroxime Axetil Other (See Comments)    Muscle pain   Loratadine-Pseudoephedrine Er     Muscle pain. Other reaction(s):  Muscle Pain   Percocet [Oxycodone-Acetaminophen] Itching   Valproic Acid     Other reaction(s): Liver Disorder "affects liver" Other reaction(s): Liver Disorder    Ziprasidone Hcl Other (See Comments)    Inflames her liver, elevated LFTs Other reaction(s): Liver Disorder   Cariprazine Palpitations   Lamictal [Lamotrigine] Rash   Tape Rash    Ok to [place] paper tape   Wellbutrin [Bupropion] Palpitations    And near syncope per pt    Family History  Problem Relation Age of Onset   Bipolar disorder Brother    Drug abuse Brother    Schizophrenia Maternal Uncle    Diabetes Maternal Grandfather    Diabetes Paternal Grandfather    Cancer Cousin    Brain cancer Cousin     Prior to Admission medications   Medication Sig Start Date End Date Taking? Authorizing Provider  albuterol (PROVENTIL HFA;VENTOLIN HFA) 108 (90 Base) MCG/ACT inhaler Inhale 2 puffs into the lungs every 6 (six) hours as needed for wheezing or shortness of breath. Patient can only tolerate ventolin inhaler 02/17/16   Frederich Cha, MD  ALPRAZolam Duanne Moron) 0.5 MG tablet Take 1 tablet (0.5 mg total) by mouth at bedtime as needed for anxiety. 03/16/22 05/15/22  Norman Clay, MD  amLODipine (NORVASC) 10 MG tablet Take 10 mg by mouth daily.    [provider]  Cyanocobalamin (VITAMIN B-12 IJ) Inject 1 Dose as directed every 30 (thirty) days.    [provider]  glucose blood test strip OneTouch Ultra Test strips    [provider]  hydrOXYzine (VISTARIL) 25 MG capsule TAKE 1 CAPSULE (25 MG TOTAL) BY MOUTH DAILY AS NEEDED FOR ANXIETY. 01/29/22 04/29/22  Norman Clay, MD  lisinopril-hydrochlorothiazide (ZESTORETIC) 20-12.5 MG tablet Take 1 tablet by mouth daily.    [provider]  lithium carbonate (ESKALITH) 450 MG CR tablet Take 1 tablet (450 mg total) by mouth 2 (two) times daily. Patient not taking: Reported on 03/12/2022 12/16/21 03/16/22  Norman Clay, MD  lurasidone (LATUDA) 80 MG TABS  tablet Take 1 tablet (80 mg total) by mouth daily with breakfast. 03/25/22 06/23/22  Norman Clay, MD  metFORMIN (GLUCOPHAGE) 500 MG tablet Take 1,000 mg by mouth 2 (two) times daily with a meal. 09/20/20   [provider]  pantoprazole (PROTONIX) 40 MG tablet Take 40 mg by mouth every morning.    [provider]  propranolol ER (INDERAL LA) 60 MG 24 hr capsule Take 60 mg by mouth daily.    [provider]  ramelteon (ROZEREM) 8 MG tablet Take 1 tablet (8 mg total) by mouth at bedtime. 03/15/22 05/14/22  Norman Clay, MD  rosuvastatin (CRESTOR) 5 MG tablet Take 5 mg by mouth at bedtime. 03/26/18   [provider]  sertraline (ZOLOFT) 100 MG tablet Take 1.5 tablets (150 mg total) by mouth at bedtime. 02/13/22  05/14/22  Norman Clay, MD    Physical Exam: Vitals:   03/14/22 1730 03/14/22 1745 03/14/22 1800 03/14/22 1830  BP: (!) 97/59  (!) 107/59 (!) 107/59  Pulse: 81  80 83  Resp: (!) '21 17 15 17  '$ Temp:      TempSrc:      SpO2: 95% 97% 98% 100%  Weight:      Height:       Physical Exam Vitals and nursing note reviewed.  Constitutional:      General: She is not in acute distress.    Appearance: She is obese. She is not ill-appearing or toxic-appearing.  HENT:     Head: Normocephalic and atraumatic.     Mouth/Throat:     Mouth: Mucous membranes are moist.     Pharynx: Oropharynx is clear.  Eyes:     Extraocular Movements: Extraocular movements intact.     Conjunctiva/sclera: Conjunctivae normal.     Pupils: Pupils are equal, round, and reactive to light.  Cardiovascular:     Rate and Rhythm: Normal rate and regular rhythm.     Heart sounds: No murmur heard.    No gallop.  Pulmonary:     Effort: Pulmonary effort is normal. No respiratory distress.     Breath sounds: Normal breath sounds. No wheezing, rhonchi or rales.  Abdominal:     General: Bowel sounds are normal. There is no distension.     Palpations: Abdomen is soft.     Tenderness: There  is no abdominal tenderness. There is no guarding.  Musculoskeletal:     Right lower leg: No edema.     Left lower leg: No edema.  Skin:    General: Skin is warm and dry.  Neurological:     Mental Status: She is alert and oriented to person, place, and time. Mental status is at baseline.     Cranial Nerves: No cranial nerve deficit.     Motor: No weakness.  Psychiatric:        Mood and Affect: Mood normal.        Behavior: Behavior normal.    Data Reviewed: CBC with WBC of 13.6, hemoglobin of 11.0, MCV of 78, platelets of 366.CMP with glucose of 183, creatinine of 1.04, BUN of 16, and calcium of 8.1.  No other electrolyte abnormalities or LFT elevations noted.  Initial troponin negative.  EKG personally reviewed.  Significant motion artifact noted.  Sinus rhythm with a rate of 76.  No acute ST or T wave changes to suggest acute ischemia.  Results are pending, will review when available.  Assessment and Plan: * Syncope Patient presents after a syncopal episode with significant hypotension.  Differential includes adverse drug reaction VS severe hypotension (in the setting of recent increase in antihypertensives +/- dehydration 2/2 diarrhea).  Lower on the differential is seizure especially since there is no urinary/bowel incontinence, tongue biting or postictal state.  WBC is elevated the patient has been experiencing diarrhea, however clinical picture is inconsistent with septic shock.  No electrolyte abnormalities to explain symptoms, EKG is reassuring and troponin are negative x 2.  Given patient's history of dilated cardiomyopathy, will obtain an echocardiogram to ensure no changes that may be contributing.  - Hold home antihypertensives - Telemetry monitoring - Echocardiogram ordered - Discussed abstaining from further marjiuana/Delta 9 use  Hypotension Patient's antihypertensives have recently been advanced due to persistent hypertension in the setting of ECT.  Approximately 3  weeks ago, amlodipine 10 mg was added.  Her lisinopril 10 mg was changed to lisinopril-HCTZ 40-25 mg.  Several days later, propranolol 80 mg was added.  It is possible that this is contributing to hypotension, especially in light of diarrhea that may have caused dehydration.  Lower on the differential is drug reaction to delta 8, however no studies demonstrating delta 8 has lead hypotension.  - Hold home antihypertensives - Continue IV fluids  AKI (acute kidney injury) ([place]) Creatinine elevated on admission up to 1.04.  Previously approximately 0.7.  Etiology likely multifactorial.  Patient's lisinopril dose was recently increased from 10 mg to 40 and HCTZ was added.  In addition, patient has been having diarrhea.   - Continue IV fluids - Repeat BMP in the a.m. - Hold nephrotoxic agents  Essential hypertension - Hold home antihypertensives as noted above  Cardiomyopathy, dilated ([place]) Most recent ejection fraction of 45% approximately 6 months ago.  Given syncope today, will repeat echocardiogram to ensure no changes have occurred.  Patient is euvolemic on examination with BMP within normal limits.  - Echocardiogram pending  Type 2 diabetes mellitus without complications ([place]) - Hold home metformin - A1c pending - SSI, moderate  Advance Care Planning:   Code Status: Full Code   Consults: None  Family Communication: Patient's husband and mother updated at bedside  Severity of Illness: The appropriate patient status for this patient is OBSERVATION. Observation status is judged to be reasonable and necessary in order to provide the required intensity of service to ensure the patient's safety. The patient's presenting symptoms, physical exam findings, and initial radiographic and laboratory data in the context of their medical condition is felt to place them at decreased risk for further clinical deterioration. Furthermore, it is anticipated that the patient will be medically stable for  discharge from the hospital within 2 midnights of admission.   Author: Jose Persia, MD 03/14/2022 8:49 PM  For on call review [email or web address].

## 2022-03-15 DIAGNOSIS — R55 Syncope and collapse: Secondary | ICD-10-CM | POA: Diagnosis not present

## 2022-03-15 DIAGNOSIS — R197 Diarrhea, unspecified: Secondary | ICD-10-CM | POA: Diagnosis not present

## 2022-03-15 DIAGNOSIS — N179 Acute kidney failure, unspecified: Secondary | ICD-10-CM

## 2022-03-15 LAB — CBC
HCT: 32.7 % — ABNORMAL LOW (ref 36.0–46.0)
Hemoglobin: 10.3 g/dL — ABNORMAL LOW (ref 12.0–15.0)
MCH: 25 pg — ABNORMAL LOW (ref 26.0–34.0)
MCHC: 31.5 g/dL (ref 30.0–36.0)
MCV: 79.4 fL — ABNORMAL LOW (ref 80.0–100.0)
Platelets: 294 10*3/uL (ref 150–400)
RBC: 4.12 MIL/uL (ref 3.87–5.11)
RDW: 14.2 % (ref 11.5–15.5)
WBC: 11.7 10*3/uL — ABNORMAL HIGH (ref 4.0–10.5)
nRBC: 0 % (ref 0.0–0.2)

## 2022-03-15 LAB — BASIC METABOLIC PANEL
Anion gap: 8 (ref 5–15)
BUN: 13 mg/dL (ref 6–20)
CO2: 24 mmol/L (ref 22–32)
Calcium: 9 mg/dL (ref 8.9–10.3)
Chloride: 110 mmol/L (ref 98–111)
Creatinine, Ser: 0.64 mg/dL (ref 0.44–1.00)
GFR, Estimated: >60 mL/min (ref >60)
Glucose, Bld: 174 mg/dL — ABNORMAL HIGH (ref 70–99)
Potassium: 3.4 mmol/L — ABNORMAL LOW (ref 3.5–5.1)
Sodium: 142 mmol/L (ref 135–145)

## 2022-03-15 LAB — HIV ANTIBODY (ROUTINE TESTING W REFLEX): HIV Screen 4th Generation wRfx: NONREACTIVE

## 2022-03-15 LAB — LACTIC ACID, PLASMA: Lactic Acid, Venous: 2.4 mmol/L (ref 0.5–1.9)

## 2022-03-15 LAB — CBG MONITORING, ED: Glucose-Capillary: 139 mg/dL — ABNORMAL HIGH (ref 70–99)

## 2022-03-15 MED ORDER — LISINOPRIL-HYDROCHLOROTHIAZIDE 20-12.5 MG PO TABS: 1.0000 | ORAL_TABLET | Freq: Every day | ORAL | 0 refills | Status: AC

## 2022-03-15 MED ORDER — METFORMIN HCL 500 MG PO TABS
1000.0000 mg | ORAL_TABLET | Freq: Two times a day (BID) | ORAL | Status: DC
  Administered 2022-03-15: 1000 mg via ORAL
  Filled 2022-03-15: qty 2

## 2022-03-15 NOTE — Discharge Instructions (Addendum)
Keep log of BP at home fopr PCP to review

## 2022-03-15 NOTE — ED Notes (Signed)
Ambulated well with no issues.

## 2022-03-15 NOTE — Discharge Summary (Signed)
Physician Discharge Summary   Patient: Susan Tanner MRN: [id number] DOB: Apr 19, 1981  Admit date:     03/14/2022  Discharge date: 03/15/22  Discharge Physician: Fritzi Mandes   PCP: Sallee Lange, NP   Recommendations at discharge:   keep a log of your blood pressure readings at home take at different times of the day and have PCP review I have held your propranolol ER for now. Follow-up PCP on Monday  Discharge Diagnoses: Principal Problem:   Syncope Active Problems:   Hypotension   AKI (acute kidney injury) ([place])   Essential hypertension   Cardiomyopathy, dilated ([place])   Type 2 diabetes mellitus without complications [facility])   Hospital Course:  Susan Tanner is a 41 y.o. female with medical history significant of dilated cardiomyopathy with most recent EF of 40%, hypertension, type 2 diabetes, depression and bipolar 1 on ECT, endometriosis, asthma, who presents to the ED with syncope.  pt states that for the past 2 days, she has been experiencing nonbloody, nonmelanotic diarrhea.   Per EMS report, blood pressure was 78/38 with minimal improvement after 500 cc bolus.  She was saturating at 90% on room air with complaints of nausea and weakness.   Syncope Hypotension -- suspected due to G.I. losses, dehydration, antihypertensives -- patient received IV fluids. Blood pressure meds were held. Blood pressure much improved -- overall at baseline. No chest pain no vomiting -- discussed abstaining from further use of marijuana and other street drugs  History of hypertension -- I have resumed her hydrochlorothiazide/lisinopril and amlodipine -- holding propranolol ER  Acute renal failure secondary to G.I. loss/dehydration -- baseline creatinine 0.7 -- came in with creatinine of 1.04 and elevated lactic acid -- now improved with IV hydration -- able to tolerated PO's  History of dilated cardiomyopathy EF of 45% -- patient volume status is stable. -- Denies any chest  pain  Type II diabetes without complication -- resume home metformin  Anxiety/depression/bipolar disorder -- patient has gotten recently ECT treatment by Dr. Weber Cooks -- you home meds  Overall hemodynamically stable. Patient feels well and at baseline for discharge. Discussed with mother       Consultants: none Disposition: Home Diet recommendation:  Discharge Diet Orders (From admission, onward)     Start     Ordered   03/15/22 0000  Diet - low sodium heart healthy        03/15/22 1148           Cardiac and Carb modified diet DISCHARGE MEDICATION: Allergies as of 03/15/2022       Reactions   Depakote Er [divalproex Sodium Er] Other (See Comments)   Affects liver enzymes   Cefuroxime Axetil Other (See Comments)   Muscle pain   Loratadine-pseudoephedrine Er    Muscle pain. Other reaction(s): Muscle Pain   Percocet [oxycodone-acetaminophen] Itching   Valproic Acid    Other reaction(s): Liver Disorder "affects liver" Other reaction(s): Liver Disorder   Ziprasidone Hcl Other (See Comments)   Inflames her liver, elevated LFTs Other reaction(s): Liver Disorder   Cariprazine Palpitations   Lamictal [lamotrigine] Rash   Tape Rash   Ok to [place] paper tape   Wellbutrin [bupropion] Palpitations   And near syncope per pt        Medication List     STOP taking these medications    lithium carbonate 450 MG CR tablet Commonly known as: ESKALITH   propranolol ER 60 MG 24 hr capsule Commonly known as: INDERAL LA  TAKE these medications    albuterol 108 (90 Base) MCG/ACT inhaler Commonly known as: VENTOLIN HFA Inhale 2 puffs into the lungs every 6 (six) hours as needed for wheezing or shortness of breath. Patient can only tolerate ventolin inhaler   ALPRAZolam 0.5 MG tablet Commonly known as: XANAX Take 1 tablet (0.5 mg total) by mouth at bedtime as needed for anxiety. Start taking on: March 16, 2022   amLODipine 10 MG tablet Commonly known as:  NORVASC Take 10 mg by mouth daily.   clindamycin 1 % lotion Commonly known as: CLEOCIN T APPLY A THIN LAYER TWICE DAILY TO AFFECTED AREAS   glucose blood test strip OneTouch Ultra Test strips   hydrOXYzine 25 MG capsule Commonly known as: VISTARIL TAKE 1 CAPSULE (25 MG TOTAL) BY MOUTH DAILY AS NEEDED FOR ANXIETY.   lisinopril-hydrochlorothiazide 20-12.5 MG tablet Commonly known as: ZESTORETIC Take 1 tablet by mouth daily. What changed: how much to take   lurasidone 80 MG Tabs tablet Commonly known as: LATUDA Take 1 tablet (80 mg total) by mouth daily with breakfast. Start taking on: March 25, 2022 What changed: when to take this   metFORMIN 500 MG tablet Commonly known as: GLUCOPHAGE Take 1,000 mg by mouth 2 (two) times daily with a meal.   pantoprazole 40 MG tablet Commonly known as: PROTONIX Take 40 mg by mouth every morning.   ramelteon 8 MG tablet Commonly known as: ROZEREM Take 1 tablet (8 mg total) by mouth at bedtime.   rosuvastatin 5 MG tablet Commonly known as: CRESTOR Take 5 mg by mouth at bedtime.   sertraline 100 MG tablet Commonly known as: ZOLOFT Take 1.5 tablets (150 mg total) by mouth at bedtime.   VITAMIN B-12 IJ Inject 1 Dose as directed every 30 (thirty) days.        Follow-up Information     Gauger, Victoriano Lain, NP. Go to.   Specialty: Internal Medicine Why: next week on your appt Contact information: [place] [phone] [phone]                Discharge Exam: Filed Weights   03/14/22 1712  Weight: 112 kg     Condition at discharge: fair  The results of significant diagnostics from this hospitalization (including imaging, microbiology, ancillary and laboratory) are listed below for reference.   Imaging Studies: DG Chest 2 View  Result Date: 03/14/2022 CLINICAL DATA:  Weakness and near syncopal episode. EXAM: CHEST - 2 VIEW COMPARISON:  01/16/2022 FINDINGS: The cardiac silhouette,  mediastinal and hilar contours are within normal limits. The lungs are clear. No pleural effusions. No pulmonary lesions. No pneumothorax. The bony thorax is intact. IMPRESSION: No acute cardiopulmonary findings. Electronically Signed   By: Marijo Sanes M.D.   On: 03/14/2022 18:59    Microbiology: Results for orders placed or performed during the hospital encounter of 03/14/22  Resp Panel by RT-PCR (Flu A&B, Covid) Anterior Nasal Swab     Status: None   Collection Time: 03/14/22  8:49 PM   Specimen: Anterior Nasal Swab  Result Value Ref Range Status   SARS Coronavirus 2 by RT PCR NEGATIVE NEGATIVE Final    Comment: (NOTE) SARS-CoV-2 target nucleic acids are NOT DETECTED.  The SARS-CoV-2 RNA is generally detectable in upper respiratory specimens during the acute phase of infection. The lowest concentration of SARS-CoV-2 viral copies this assay can detect is 138 copies/mL. A negative result does not preclude SARS-Cov-2 infection and should not be used as  the sole basis for treatment or other patient management decisions. A negative result may occur with  improper specimen collection/handling, submission of specimen other than nasopharyngeal swab, presence of viral mutation(s) within the areas targeted by this assay, and inadequate number of viral copies(<138 copies/mL). A negative result must be combined with clinical observations, patient history, and epidemiological information. The expected result is Negative.  Fact Sheet for Patients:  EntrepreneurPulse.com.au  Fact Sheet for Healthcare Providers:  IncredibleEmployment.be  This test is no t yet approved or cleared by the [place] FDA and  has been authorized for detection and/or diagnosis of SARS-CoV-2 by FDA under an Emergency Use Authorization (EUA). This EUA will remain  in effect (meaning this test can be used) for the duration of the COVID-19 declaration under Section 564(b)(1) of the  Act, 21 U.S.C.section 360bbb-3(b)(1), unless the authorization is terminated  or revoked sooner.       Influenza A by PCR NEGATIVE NEGATIVE Final   Influenza B by PCR NEGATIVE NEGATIVE Final    Comment: (NOTE) The Xpert Xpress SARS-CoV-2/FLU/RSV plus assay is intended as an aid in the diagnosis of influenza from Nasopharyngeal swab specimens and should not be used as a sole basis for treatment. Nasal washings and aspirates are unacceptable for Xpert Xpress SARS-CoV-2/FLU/RSV testing.  Fact Sheet for Patients: EntrepreneurPulse.com.au  Fact Sheet for Healthcare Providers: IncredibleEmployment.be  This test is not yet approved or cleared by the [place] FDA and has been authorized for detection and/or diagnosis of SARS-CoV-2 by FDA under an Emergency Use Authorization (EUA). This EUA will remain in effect (meaning this test can be used) for the duration of the COVID-19 declaration under Section 564(b)(1) of the Act, 21 U.S.C. section 360bbb-3(b)(1), unless the authorization is terminated or revoked.  Performed at [facility], [address] [place], [place] [address]     Labs: CBC: Recent Labs  Lab 03/14/22 1710 03/15/22 0453  WBC 13.6* 11.7*  NEUTROABS 8.8*  --   HGB 11.0* 10.3*  HCT 33.7* 32.7*  MCV 78.4* 79.4*  PLT 366 086   Basic Metabolic Panel: Recent Labs  Lab 03/14/22 1710 03/15/22 0453  NA 139 142  K 3.7 3.4*  CL 109 110  CO2 23 24  GLUCOSE 183* 174*  BUN 16 13  CREATININE 1.04* 0.64  CALCIUM 8.1* 9.0   Liver Function Tests: Recent Labs  Lab 03/14/22 1710  AST 27  ALT 26  ALKPHOS 49  BILITOT 0.5  PROT 6.5  ALBUMIN 3.8   CBG: Recent Labs  Lab 03/14/22 1711 03/15/22 0803  GLUCAP 169* 139*    Discharge time spent: greater than 30 minutes.  Signed: Fritzi Mandes, MD [facility] 03/15/2022

## 2022-03-16 LAB — HEMOGLOBIN A1C
Hgb A1c MFr Bld: 6.6 % — ABNORMAL HIGH (ref 4.8–5.6)
Mean Plasma Glucose: 143 mg/dL

## 2022-04-04 ENCOUNTER — Other Ambulatory Visit: Payer: Self-pay | Admitting: Psychiatry

## 2022-04-17 ENCOUNTER — Ambulatory Visit: Payer: Medicare Other
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
## 2022-04-25 ENCOUNTER — Encounter: Payer: Self-pay | Admitting: Psychiatry

## 2022-04-25 ENCOUNTER — Ambulatory Visit (INDEPENDENT_AMBULATORY_CARE_PROVIDER_SITE_OTHER): Payer: Medicare Other | Admitting: Psychiatry

## 2022-04-25 VITALS — BP 126/84 | HR 79 | Temp 97.6°F | Ht 65.0 in | Wt 246.0 lb

## 2022-04-25 DIAGNOSIS — G4700 Insomnia, unspecified: Secondary | ICD-10-CM | POA: Diagnosis not present

## 2022-04-25 DIAGNOSIS — F4310 Post-traumatic stress disorder, unspecified: Secondary | ICD-10-CM

## 2022-04-25 DIAGNOSIS — F259 Schizoaffective disorder, unspecified: Secondary | ICD-10-CM

## 2022-04-25 MED ORDER — SERTRALINE HCL 100 MG PO TABS: 150.0000 mg | ORAL_TABLET | Freq: Every day | ORAL | 0 refills | Status: DC

## 2022-04-25 MED ORDER — RAMELTEON 8 MG PO TABS: 8.0000 mg | ORAL_TABLET | Freq: Every day | ORAL | 0 refills | Status: DC

## 2022-04-25 NOTE — Patient Instructions (Signed)
Continue sertraline 150 mg at night Continue Latuda 80 mg at night Continue hydroxyzine 25 mg as needed for anxiety Continue ramelteon 8 mg at night  Continue Xanax 0.5 mg daily as needed for anxiety  Next appointment- 2/22 at 3 PM

## 2022-04-26 ENCOUNTER — Other Ambulatory Visit: Payer: Self-pay | Admitting: Psychiatry

## 2022-05-15 ENCOUNTER — Inpatient Hospital Stay: Payer: 59

## 2022-05-16 ENCOUNTER — Ambulatory Visit: Payer: Medicare Other | Admitting: Psychiatry

## 2022-06-05 ENCOUNTER — Other Ambulatory Visit: Payer: Self-pay | Admitting: Psychiatry
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
## 2022-06-11 ENCOUNTER — Other Ambulatory Visit: Payer: Self-pay | Admitting: Psychiatry

## 2022-06-13 ENCOUNTER — Encounter: Payer: Self-pay | Admitting: Psychiatry

## 2022-06-13 ENCOUNTER — Ambulatory Visit (INDEPENDENT_AMBULATORY_CARE_PROVIDER_SITE_OTHER): Payer: 59 | Admitting: Psychiatry

## 2022-06-13 VITALS — BP 129/85 | HR 85 | Temp 97.2°F | Ht 65.0 in | Wt 245.8 lb

## 2022-06-13 DIAGNOSIS — G4700 Insomnia, unspecified: Secondary | ICD-10-CM | POA: Diagnosis not present

## 2022-06-13 DIAGNOSIS — F4310 Post-traumatic stress disorder, unspecified: Secondary | ICD-10-CM

## 2022-06-13 DIAGNOSIS — F259 Schizoaffective disorder, unspecified: Secondary | ICD-10-CM | POA: Diagnosis not present

## 2022-06-13 MED ORDER — LURASIDONE HCL 80 MG PO TABS: 80.0000 mg | ORAL_TABLET | Freq: Every day | ORAL | 1 refills | Status: DC

## 2022-06-13 MED ORDER — RAMELTEON 8 MG PO TABS: 8.0000 mg | ORAL_TABLET | Freq: Every day | ORAL | 2 refills | Status: DC

## 2022-06-13 MED ORDER — HYDROXYZINE PAMOATE 25 MG PO CAPS: 25.0000 mg | ORAL_CAPSULE | Freq: Every day | ORAL | 1 refills | Status: DC | PRN

## 2022-06-13 MED ORDER — SERTRALINE HCL 100 MG PO TABS: 150.0000 mg | ORAL_TABLET | Freq: Every day | ORAL | 0 refills | Status: DC

## 2022-06-13 NOTE — Patient Instructions (Signed)
Continue sertraline 150 mg at night Continue Latuda 80 mg at night Continue hydroxyzine 25 mg as needed for anxiety Continue ramelteon 8 mg at night  Continue Xanax 0.5 mg daily as needed for anxiety Next appointment- 5/2 at 11 AM
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
## 2022-08-22 ENCOUNTER — Encounter: Payer: Self-pay | Admitting: Psychiatry

## 2022-08-22 ENCOUNTER — Ambulatory Visit (INDEPENDENT_AMBULATORY_CARE_PROVIDER_SITE_OTHER): Payer: 59 | Admitting: Psychiatry

## 2022-08-22 VITALS — BP 126/76 | HR 64 | Temp 97.3°F | Ht 65.0 in | Wt 247.6 lb

## 2022-08-22 DIAGNOSIS — F4310 Post-traumatic stress disorder, unspecified: Secondary | ICD-10-CM

## 2022-08-22 DIAGNOSIS — F259 Schizoaffective disorder, unspecified: Secondary | ICD-10-CM

## 2022-08-22 DIAGNOSIS — G4700 Insomnia, unspecified: Secondary | ICD-10-CM

## 2022-08-22 MED ORDER — LURASIDONE HCL 20 MG PO TABS: 20.0000 mg | ORAL_TABLET | Freq: Every day | ORAL | 0 refills | Status: DC

## 2022-08-23 LAB — VITAMIN D 25 HYDROXY (VIT D DEFICIENCY, FRACTURES): Vit D, 25-Hydroxy: 22 ng/mL — ABNORMAL LOW (ref 30.0–100.0)

## 2022-08-23 NOTE — Progress Notes (Signed)
Could you contact the patient about the blood test result. Vitamin D levels are marginally lower. I would advise taking 5000 IU of vitamin D daily to replenish this deficiency. This supplement is available over the counter,

## 2022-08-23 NOTE — Progress Notes (Signed)
Patient returned call to office made patient aware of the lab results and she voiced understanding

## 2022-08-23 NOTE — Progress Notes (Signed)
Called to inform patient of lab results no answer left voicemail for patient to return call to office 

## 2022-08-26 ENCOUNTER — Encounter (HOSPITAL_COMMUNITY): Payer: Self-pay

## 2022-09-02 ENCOUNTER — Telehealth: Payer: Self-pay

## 2022-09-02 NOTE — Telephone Encounter (Signed)
-----   Message from Neysa Hotter, MD sent at 08/23/2022  7:58 AM EDT ----- Could you contact the patient about the blood test result. Vitamin D levels are marginally lower. I would advise taking 5000 IU of vitamin D daily to replenish this deficiency. This supplement is available over the counter,

## 2022-09-02 NOTE — Telephone Encounter (Signed)
Pt was notified of test results and instruction were given per providers orders.

## 2022-09-09 ENCOUNTER — Other Ambulatory Visit: Payer: Self-pay | Admitting: Psychiatry

## 2022-09-11 ENCOUNTER — Other Ambulatory Visit: Payer: Self-pay | Admitting: Psychiatry

## 2022-09-11 ENCOUNTER — Telehealth: Payer: Self-pay

## 2022-09-11 MED ORDER — RAMELTEON 8 MG PO TABS: 8.0000 mg | ORAL_TABLET | Freq: Every day | ORAL | 0 refills | Status: DC

## 2022-09-11 NOTE — Telephone Encounter (Signed)
Ordered

## 2022-09-11 NOTE — Telephone Encounter (Signed)
Pt called states she needs refill on the ramelteon 8mg  is is out of medications. Pt last seen on 5-2 next appt 6-20.   Disp Refills Start End   ramelteon (ROZEREM) 8 MG tablet 30 tablet 2 06/13/2022 09/11/2022   Sig - Route: Take 1 tablet (8 mg total) by mouth at bedtime. - Oral   Sent to pharmacy as: ramelteon (ROZEREM) 8 MG tablet   E-Prescribing Status: Receipt confirmed by pharmacy (06/13/2022  3:27 PM EST)

## 2022-09-12 NOTE — Telephone Encounter (Signed)
Pt was notified that rx was sent to the pharmacy .  

## 2022-10-04 ENCOUNTER — Other Ambulatory Visit: Payer: Self-pay | Admitting: Psychiatry
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
## 2022-10-10 ENCOUNTER — Encounter: Payer: Self-pay | Admitting: Psychiatry

## 2022-10-10 ENCOUNTER — Telehealth (INDEPENDENT_AMBULATORY_CARE_PROVIDER_SITE_OTHER): Payer: 59 | Admitting: Psychiatry

## 2022-10-10 DIAGNOSIS — G4700 Insomnia, unspecified: Secondary | ICD-10-CM

## 2022-10-10 DIAGNOSIS — F259 Schizoaffective disorder, unspecified: Secondary | ICD-10-CM | POA: Diagnosis not present

## 2022-10-10 DIAGNOSIS — F4310 Post-traumatic stress disorder, unspecified: Secondary | ICD-10-CM

## 2022-10-10 MED ORDER — LURASIDONE HCL 20 MG PO TABS: 20.0000 mg | ORAL_TABLET | Freq: Every day | ORAL | 0 refills | Status: DC

## 2022-10-10 MED ORDER — SERTRALINE HCL 100 MG PO TABS: 200.0000 mg | ORAL_TABLET | Freq: Every day | ORAL | 0 refills | Status: DC

## 2022-10-14 ENCOUNTER — Other Ambulatory Visit: Payer: Self-pay | Admitting: Psychiatry

## 2022-10-14 MED ORDER — RAMELTEON 8 MG PO TABS: 8.0000 mg | ORAL_TABLET | Freq: Every day | ORAL | 1 refills | Status: DC

## 2022-10-20 ENCOUNTER — Other Ambulatory Visit: Payer: Self-pay | Admitting: Psychiatry

## 2022-10-31 ENCOUNTER — Other Ambulatory Visit: Payer: Self-pay | Admitting: Psychiatry

## 2022-11-07 ENCOUNTER — Emergency Department
Admission: EM | Admit: 2022-11-07 | Discharge: 2022-11-07 | Disposition: A | Discharge status: Home / Self Care | Payer: 59 | Attending: Emergency Medicine | Admitting: Emergency Medicine

## 2022-11-07 ENCOUNTER — Other Ambulatory Visit: Payer: Self-pay | Admitting: Psychiatry

## 2022-11-07 ENCOUNTER — Other Ambulatory Visit: Payer: Self-pay

## 2022-11-07 ENCOUNTER — Emergency Department: Payer: 59

## 2022-11-07 DIAGNOSIS — N83201 Unspecified ovarian cyst, right side: Secondary | ICD-10-CM

## 2022-11-07 DIAGNOSIS — Z7984 Long term (current) use of oral hypoglycemic drugs: Secondary | ICD-10-CM | POA: Insufficient documentation

## 2022-11-07 DIAGNOSIS — R102 Pelvic and perineal pain: Secondary | ICD-10-CM | POA: Diagnosis present

## 2022-11-07 DIAGNOSIS — Z79899 Other long term (current) drug therapy: Secondary | ICD-10-CM | POA: Diagnosis not present

## 2022-11-07 LAB — CBC WITH DIFFERENTIAL/PLATELET
Abs Immature Granulocytes: 0.03 10*3/uL (ref 0.00–0.07)
Basophils Absolute: 0.1 10*3/uL (ref 0.0–0.1)
Basophils Relative: 1 %
Eosinophils Absolute: 0.2 10*3/uL (ref 0.0–0.5)
Eosinophils Relative: 2 %
HCT: 33.9 % — ABNORMAL LOW (ref 36.0–46.0)
Hemoglobin: 11.2 g/dL — ABNORMAL LOW (ref 12.0–15.0)
Immature Granulocytes: 0 %
Lymphocytes Relative: 29 %
Lymphs Abs: 2.9 10*3/uL (ref 0.7–4.0)
MCH: 26.2 pg (ref 26.0–34.0)
MCHC: 33 g/dL (ref 30.0–36.0)
MCV: 79.2 fL — ABNORMAL LOW (ref 80.0–100.0)
Monocytes Absolute: 0.7 10*3/uL (ref 0.1–1.0)
Monocytes Relative: 7 %
Neutro Abs: 6 10*3/uL (ref 1.7–7.7)
Neutrophils Relative %: 61 %
Platelets: 327 10*3/uL (ref 150–400)
RBC: 4.28 MIL/uL (ref 3.87–5.11)
RDW: 14.2 % (ref 11.5–15.5)
WBC: 10 10*3/uL (ref 4.0–10.5)
nRBC: 0 % (ref 0.0–0.2)

## 2022-11-07 LAB — COMPREHENSIVE METABOLIC PANEL
ALT: 22 U/L (ref 0–44)
AST: 22 U/L (ref 15–41)
Albumin: 3.9 g/dL (ref 3.5–5.0)
Alkaline Phosphatase: 66 U/L (ref 38–126)
Anion gap: 7 (ref 5–15)
BUN: 12 mg/dL (ref 6–20)
CO2: 25 mmol/L (ref 22–32)
Calcium: 8.6 mg/dL — ABNORMAL LOW (ref 8.9–10.3)
Chloride: 105 mmol/L (ref 98–111)
Creatinine, Ser: 0.79 mg/dL (ref 0.44–1.00)
GFR, Estimated: >60 mL/min (ref >60)
Glucose, Bld: 96 mg/dL (ref 70–99)
Potassium: 3.6 mmol/L (ref 3.5–5.1)
Sodium: 137 mmol/L (ref 135–145)
Total Bilirubin: 0.6 mg/dL (ref 0.3–1.2)
Total Protein: 7.2 g/dL (ref 6.5–8.1)

## 2022-11-07 LAB — URINALYSIS, ROUTINE W REFLEX MICROSCOPIC
Bilirubin Urine: NEGATIVE
Glucose, UA: NEGATIVE mg/dL
Hgb urine dipstick: NEGATIVE
Ketones, ur: NEGATIVE mg/dL
Leukocytes,Ua: NEGATIVE
Nitrite: NEGATIVE
Protein, ur: NEGATIVE mg/dL
Specific Gravity, Urine: 1.017 (ref 1.005–1.030)
pH: 5 (ref 5.0–8.0)

## 2022-11-07 LAB — CBG MONITORING, ED: Glucose-Capillary: 89 mg/dL (ref 70–99)

## 2022-11-07 MED ORDER — HYDROCODONE-ACETAMINOPHEN 5-325 MG PO TABS: 1.0000 | ORAL_TABLET | ORAL | 0 refills | Status: DC | PRN

## 2022-11-07 MED ORDER — ONDANSETRON 4 MG PO TBDP
4.0000 mg | ORAL_TABLET | Freq: Once | ORAL | Status: AC
  Administered 2022-11-07: 4 mg via ORAL
  Filled 2022-11-07: qty 1

## 2022-11-07 MED ORDER — HYDROCODONE-ACETAMINOPHEN 5-325 MG PO TABS
1.0000 | ORAL_TABLET | ORAL | Status: AC
  Administered 2022-11-07: 1 via ORAL
  Filled 2022-11-07: qty 1

## 2022-11-07 MED ORDER — ONDANSETRON 4 MG PO TBDP: 4.0000 mg | ORAL_TABLET | Freq: Three times a day (TID) | ORAL | 0 refills | Status: DC | PRN

## 2022-11-07 NOTE — ED Provider Notes (Signed)
[place] EMERGENCY DEPARTMENT AT [facility] Provider Note   CSN: [id number] Arrival date & time: 11/07/22  1252     History  Chief Complaint  Patient presents with   Pelvic Pain    Patient presents with bilateral "ovarian" pain; She has endometriosis and has had a partial hysterectomy; She states the pain has been ongoing for approx. 2 week    Susan Tanner is a 42 y.o. female with extensive history of ovarian cyst, history of hysterectomy back in 2011, endometriosis, bipolar disorder, chronic pelvic pain presents to the emergency department for evaluation of 3 weeks of lower pelvic pain that has not improved.  Pain is similar to previous cyst but typically the pain resolves.  She has had some associated nausea with the pain.  Pain is 7 out of 10.  She has not any medications for the pain.  She denies any urinary symptoms, back pain, nausea vomiting or diarrhea.  No constipation.  No chest pain or shortness of breath.  Pain is in her lower pelvic area bilaterally.  She denies any trauma or injury.  HPI     Home Medications Prior to Admission medications   Medication Sig Start Date End Date Taking? Authorizing Provider  HYDROcodone-acetaminophen (NORCO) 5-325 MG tablet Take 1 tablet by mouth every 4 (four) hours as needed for moderate pain. 11/07/22  Yes Evon Slack, PA-C  ondansetron (ZOFRAN-ODT) 4 MG disintegrating tablet Take 1 tablet (4 mg total) by mouth every 8 (eight) hours as needed for nausea or vomiting. 11/07/22  Yes Evon Slack, PA-C  albuterol (PROVENTIL HFA;VENTOLIN HFA) 108 (90 Base) MCG/ACT inhaler Inhale 2 puffs into the lungs every 6 (six) hours as needed for wheezing or shortness of breath. Patient can only tolerate ventolin inhaler 02/17/16   Hassan Rowan, MD  clindamycin (CLEOCIN T) 1 % lotion APPLY A THIN LAYER TWICE DAILY TO AFFECTED AREAS 01/07/22   [provider]  Cyanocobalamin (VITAMIN B-12 IJ) Inject 1 Dose as directed every 30  (thirty) days.    [provider]  glucose blood test strip OneTouch Ultra Test strips    [provider]  hydrOXYzine (VISTARIL) 25 MG capsule Take 1 capsule (25 mg total) by mouth daily as needed for anxiety. 07/28/22 01/24/23  Neysa Hotter, MD  lisinopril-hydrochlorothiazide (ZESTORETIC) 20-12.5 MG tablet Take 1 tablet by mouth daily. 03/15/22   Enedina Finner, MD  lurasidone (LATUDA) 20 MG TABS tablet Take 1 tablet (20 mg total) by mouth daily. Total of 100 mg daily. Take along with 80 mg tab 11/20/22 02/18/23  Neysa Hotter, MD  lurasidone (LATUDA) 80 MG TABS tablet Take 1 tablet (80 mg total) by mouth daily with breakfast. 06/23/22 12/20/22  Neysa Hotter, MD  metFORMIN (GLUCOPHAGE) 500 MG tablet Take 1,000 mg by mouth 2 (two) times daily with a meal. 09/20/20   [provider]  pantoprazole (PROTONIX) 40 MG tablet Take 40 mg by mouth every morning.    [provider]  propranolol ER (INDERAL LA) 60 MG 24 hr capsule Take 60 mg by mouth daily. 03/21/22   [provider]  ramelteon (ROZEREM) 8 MG tablet Take 1 tablet (8 mg total) by mouth at bedtime. 09/11/22 10/11/22  Neysa Hotter, MD  ramelteon (ROZEREM) 8 MG tablet Take 1 tablet (8 mg total) by mouth at bedtime. 10/14/22 12/13/22  Neysa Hotter, MD  rosuvastatin (CRESTOR) 5 MG tablet Take 5 mg by mouth at bedtime. 03/26/18   [provider]  sertraline (ZOLOFT)  100 MG tablet Take 2 tablets (200 mg total) by mouth at bedtime. 10/17/22 01/15/23  Neysa Hotter, MD      Allergies    Depakote er Mliss Sax sodium er], Cefuroxime axetil, Loratadine-pseudoephedrine er, Percocet [oxycodone-acetaminophen], Valproic acid, Ziprasidone hcl, Cariprazine, Lamictal [lamotrigine], Tape, and Wellbutrin [bupropion]    Review of Systems   Review of Systems  Physical Exam Updated Vital Signs BP (!) 123/95   Pulse 74   Temp 98.4 F (36.9 C) (Oral)   Resp 19   Ht 5\' 5"  (1.651 m)   Wt 113.4 kg   SpO2 98%   BMI  41.60 kg/m  Physical Exam Constitutional:      Appearance: She is well-developed.  HENT:     Head: Normocephalic and atraumatic.     Mouth/Throat:     Mouth: Mucous membranes are moist.     Pharynx: No oropharyngeal exudate or posterior oropharyngeal erythema.  Eyes:     Conjunctiva/sclera: Conjunctivae normal.  Cardiovascular:     Rate and Rhythm: Normal rate.     Pulses: Normal pulses.     Heart sounds: Normal heart sounds.  Pulmonary:     Effort: Pulmonary effort is normal. No respiratory distress.  Abdominal:     General: Abdomen is flat. Bowel sounds are normal. There is no distension.     Tenderness: There is no abdominal tenderness. There is no right CVA tenderness, left CVA tenderness or guarding.  Musculoskeletal:        General: Normal range of motion.     Cervical back: Normal range of motion.  Skin:    General: Skin is warm.     Findings: No rash.  Neurological:     General: No focal deficit present.     Mental Status: She is alert and oriented to person, place, and time.     Cranial Nerves: No cranial nerve deficit.  Psychiatric:        Behavior: Behavior normal.        Thought Content: Thought content normal.     ED Results / Procedures / Treatments   Labs (all labs ordered are listed, but only abnormal results are displayed) Labs Reviewed  URINALYSIS, ROUTINE W REFLEX MICROSCOPIC - Abnormal; Notable for the following components:      Result Value   Color, Urine YELLOW (*)    APPearance CLEAR (*)    All other components within normal limits  COMPREHENSIVE METABOLIC PANEL - Abnormal; Notable for the following components:   Calcium 8.6 (*)    All other components within normal limits  CBC WITH DIFFERENTIAL/PLATELET - Abnormal; Notable for the following components:   Hemoglobin 11.2 (*)    HCT 33.9 (*)    MCV 79.2 (*)    All other components within normal limits  CBG MONITORING, ED    EKG None  Radiology US PELVIC COMPLETE WITH  TRANSVAGINAL  Result Date: 11/07/2022 CLINICAL DATA:  Pelvic pain.  Hysterectomy. EXAM: TRANSABDOMINAL AND TRANSVAGINAL ULTRASOUND OF PELVIS TECHNIQUE: Both transabdominal and transvaginal ultrasound examinations of the pelvis were performed. Transabdominal technique was performed for global imaging of the pelvis including uterus, ovaries, adnexal regions, and pelvic cul-de-sac. It was necessary to proceed with endovaginal exam following the transabdominal exam to visualize the ovaries/adnexa. COMPARISON:  ultrasound 07/01/2018 FINDINGS: Uterus Hysterectomy. Right ovary Measurements: 5.5 x 2.5 x 4.5 cm = volume: 31 mL. Anechoic cyst containing avascular internal reticulations measuring 5.1 x 3.7 x 4.5 cm. Left ovary Measurements: 3.8 x 2.2 x 1.2 cm =  volume: 5.2 mL. Normal appearance/no adnexal mass. Other findings No abnormal free fluid. IMPRESSION: Right ovarian cyst measuring 5.1 cm, likely a hemorrhagic cyst. Recommend follow-up ultrasound in 6-12 weeks. Electronically Signed   By: Minerva Fester M.D.   On: 11/07/2022 17:13    Procedures Procedures    Medications Ordered in ED Medications  HYDROcodone-acetaminophen (NORCO/VICODIN) 5-325 MG per tablet 1 tablet (1 tablet Oral Given 11/07/22 1546)  ondansetron (ZOFRAN-ODT) disintegrating tablet 4 mg (4 mg Oral Given 11/07/22 1546)    ED Course/ Medical Decision Making/ A&P                             Medical Decision Making Amount and/or Complexity of Data Reviewed Labs: ordered. Radiology: ordered.  Risk Prescription drug management.   41 year old female with lower pelvic pain.  History of hysterectomy.  She presents with a few weeks of lower pelvic pain.  Has a history ovarian cyst.  Blood work appears normal, no elevated white count.  Hemoglobin normal.  Her vital signs are stable, afebrile nontachycardic.  On exam very little tenderness.  Transvaginal ultrasound does show a large right-sided ovarian cyst with no signs of torsion or  free fluid.  Patient appears stable.  Pain well-controlled.  Discussed with her signs and symptoms to return to the ER for.  Recommend she follow-up with her GYN physician.  She is given Norco and Zofran to take only as needed for severe pain. Final Clinical Impression(s) / ED Diagnoses Final diagnoses:  Right ovarian cyst    Rx / DC Orders ED Discharge Orders          Ordered    HYDROcodone-acetaminophen (NORCO) 5-325 MG tablet  Every 4 hours PRN        11/07/22 1727    ondansetron (ZOFRAN-ODT) 4 MG disintegrating tablet  Every 8 hours PRN        11/07/22 1727              Ronnette Juniper 11/07/22 1731    Minna Antis, MD 11/07/22 1925

## 2022-11-07 NOTE — Discharge Instructions (Addendum)
Your ultrasound showed a large right-sided ovarian cyst consistent with likely hemorrhagic cyst.  This is likely to resolve on its own with no complications.  The radiologist recommended repeat ultrasound in 6 to 12 weeks to make sure this cyst has resolved.  Please take Norco as needed for pain.  Return to the ER for any worsening symptoms or any urgent changes in your health such as fevers, severe pain, vomiting.

## 2022-11-14 ENCOUNTER — Emergency Department
Admission: EM | Admit: 2022-11-14 | Discharge: 2022-11-14 | Disposition: A | Discharge status: Home / Self Care | Payer: 59 | Attending: Emergency Medicine | Admitting: Emergency Medicine

## 2022-11-14 ENCOUNTER — Emergency Department: Payer: 59

## 2022-11-14 ENCOUNTER — Other Ambulatory Visit: Payer: Self-pay

## 2022-11-14 DIAGNOSIS — R1031 Right lower quadrant pain: Secondary | ICD-10-CM | POA: Diagnosis present

## 2022-11-14 DIAGNOSIS — R110 Nausea: Secondary | ICD-10-CM | POA: Insufficient documentation

## 2022-11-14 DIAGNOSIS — I10 Essential (primary) hypertension: Secondary | ICD-10-CM | POA: Insufficient documentation

## 2022-11-14 DIAGNOSIS — E119 Type 2 diabetes mellitus without complications: Secondary | ICD-10-CM | POA: Diagnosis not present

## 2022-11-14 DIAGNOSIS — N83201 Unspecified ovarian cyst, right side: Secondary | ICD-10-CM

## 2022-11-14 LAB — URINALYSIS, ROUTINE W REFLEX MICROSCOPIC
Bilirubin Urine: NEGATIVE
Glucose, UA: NEGATIVE mg/dL
Hgb urine dipstick: NEGATIVE
Ketones, ur: NEGATIVE mg/dL
Leukocytes,Ua: NEGATIVE
Nitrite: NEGATIVE
Protein, ur: NEGATIVE mg/dL
Specific Gravity, Urine: 1.023 (ref 1.005–1.030)
pH: 5 (ref 5.0–8.0)

## 2022-11-14 LAB — COMPREHENSIVE METABOLIC PANEL
ALT: 26 U/L (ref 0–44)
AST: 32 U/L (ref 15–41)
Albumin: 4.1 g/dL (ref 3.5–5.0)
Alkaline Phosphatase: 49 U/L (ref 38–126)
Anion gap: 12 (ref 5–15)
BUN: 12 mg/dL (ref 6–20)
CO2: 21 mmol/L — ABNORMAL LOW (ref 22–32)
Calcium: 9 mg/dL (ref 8.9–10.3)
Chloride: 103 mmol/L (ref 98–111)
Creatinine, Ser: 0.74 mg/dL (ref 0.44–1.00)
GFR, Estimated: >60 mL/min (ref >60)
Glucose, Bld: 127 mg/dL — ABNORMAL HIGH (ref 70–99)
Potassium: 3.9 mmol/L (ref 3.5–5.1)
Sodium: 136 mmol/L (ref 135–145)
Total Bilirubin: 0.7 mg/dL (ref 0.3–1.2)
Total Protein: 7.8 g/dL (ref 6.5–8.1)

## 2022-11-14 LAB — CBC
HCT: 37.3 % (ref 36.0–46.0)
Hemoglobin: 12 g/dL (ref 12.0–15.0)
MCH: 25.6 pg — ABNORMAL LOW (ref 26.0–34.0)
MCHC: 32.2 g/dL (ref 30.0–36.0)
MCV: 79.5 fL — ABNORMAL LOW (ref 80.0–100.0)
Platelets: 343 10*3/uL (ref 150–400)
RBC: 4.69 MIL/uL (ref 3.87–5.11)
RDW: 14.5 % (ref 11.5–15.5)
WBC: 9.8 10*3/uL (ref 4.0–10.5)
nRBC: 0 % (ref 0.0–0.2)

## 2022-11-14 LAB — POC URINE PREG, ED: Preg Test, Ur: NEGATIVE

## 2022-11-14 MED ORDER — KETOROLAC TROMETHAMINE 15 MG/ML IJ SOLN
15.0000 mg | Freq: Once | INTRAMUSCULAR | Status: AC
  Administered 2022-11-14: 15 mg via INTRAVENOUS
  Filled 2022-11-14: qty 1

## 2022-11-14 MED ORDER — OXYCODONE-ACETAMINOPHEN 5-325 MG PO TABS
1.0000 | ORAL_TABLET | Freq: Once | ORAL | Status: AC
  Administered 2022-11-14: 1 via ORAL
  Filled 2022-11-14: qty 1

## 2022-11-14 MED ORDER — OXYCODONE-ACETAMINOPHEN 7.5-325 MG PO TABS: 1.0000 | ORAL_TABLET | ORAL | 0 refills | Status: DC | PRN

## 2022-11-14 MED ORDER — MORPHINE SULFATE (PF) 4 MG/ML IV SOLN
4.0000 mg | Freq: Once | INTRAVENOUS | Status: AC
  Administered 2022-11-14: 4 mg via INTRAVENOUS
  Filled 2022-11-14: qty 1

## 2022-11-14 MED ORDER — ONDANSETRON HCL 4 MG/2ML IJ SOLN
4.0000 mg | Freq: Once | INTRAMUSCULAR | Status: AC
  Administered 2022-11-14: 4 mg via INTRAVENOUS
  Filled 2022-11-14: qty 2

## 2022-11-14 NOTE — ED Triage Notes (Signed)
Pt sts that she has a ovarian cyst that was dx here at [facility]. Pt sts that she is in a lot of pain because of it. Pt endorses that she has a follow up with OB tomorrow.
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
## 2022-11-28 ENCOUNTER — Telehealth (INDEPENDENT_AMBULATORY_CARE_PROVIDER_SITE_OTHER): Payer: 59 | Admitting: Psychiatry

## 2022-11-28 ENCOUNTER — Encounter: Payer: Self-pay | Admitting: Psychiatry

## 2022-11-28 DIAGNOSIS — G4700 Insomnia, unspecified: Secondary | ICD-10-CM

## 2022-11-28 DIAGNOSIS — F259 Schizoaffective disorder, unspecified: Secondary | ICD-10-CM | POA: Diagnosis not present

## 2022-11-28 DIAGNOSIS — F4310 Post-traumatic stress disorder, unspecified: Secondary | ICD-10-CM | POA: Diagnosis not present

## 2022-11-28 MED ORDER — SERTRALINE HCL 100 MG PO TABS: 200.0000 mg | ORAL_TABLET | Freq: Every day | ORAL | 0 refills | Status: DC

## 2022-11-28 MED ORDER — LURASIDONE HCL 120 MG PO TABS: 120.0000 mg | ORAL_TABLET | Freq: Every day | ORAL | 1 refills | Status: DC

## 2022-11-28 MED ORDER — RAMELTEON 8 MG PO TABS: 8.0000 mg | ORAL_TABLET | Freq: Every day | ORAL | 1 refills | Status: DC

## 2022-11-28 NOTE — Patient Instructions (Signed)
Continue sertraline 200 mg at night Increase Latuda 120 mg at night  Continue hydroxyzine 25 mg as needed for anxiety Continue ramelteon 8 mg at night  Next appointment- 10/3 at 11 am

## 2022-12-15 ENCOUNTER — Other Ambulatory Visit: Payer: Self-pay | Admitting: Psychiatry
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
## 2023-01-23 ENCOUNTER — Ambulatory Visit (INDEPENDENT_AMBULATORY_CARE_PROVIDER_SITE_OTHER): Payer: 59 | Admitting: Psychiatry

## 2023-01-23 ENCOUNTER — Encounter: Payer: Self-pay | Admitting: Psychiatry

## 2023-01-23 VITALS — BP 106/75 | HR 83 | Temp 97.5°F | Ht 65.0 in | Wt 248.0 lb

## 2023-01-23 DIAGNOSIS — G4700 Insomnia, unspecified: Secondary | ICD-10-CM

## 2023-01-23 DIAGNOSIS — F4310 Post-traumatic stress disorder, unspecified: Secondary | ICD-10-CM | POA: Diagnosis not present

## 2023-01-23 DIAGNOSIS — F259 Schizoaffective disorder, unspecified: Secondary | ICD-10-CM | POA: Diagnosis not present

## 2023-01-23 MED ORDER — LURASIDONE HCL 120 MG PO TABS: 120.0000 mg | ORAL_TABLET | Freq: Every day | ORAL | 1 refills | Status: DC

## 2023-01-23 MED ORDER — SUVOREXANT 5 MG PO TABS: 5.0000 mg | ORAL_TABLET | Freq: Every evening | ORAL | 1 refills | Status: AC | PRN

## 2023-01-23 MED ORDER — HYDROXYZINE PAMOATE 25 MG PO CAPS: 25.0000 mg | ORAL_CAPSULE | Freq: Every day | ORAL | 1 refills | Status: DC | PRN

## 2023-01-23 NOTE — Patient Instructions (Signed)
Continue sertraline 200 mg at night Continue Latuda 120 mg at night  Continue hydroxyzine 25 mg as needed for anxiety Start belsomra 5 mg at night as needed for insomnia Discontinue ramelteon (was on 8 mg at night) Next appointment- 11/25 at 3:30

## 2023-02-09 ENCOUNTER — Other Ambulatory Visit: Payer: Self-pay | Admitting: Psychiatry

## 2023-03-02 ENCOUNTER — Emergency Department
Admission: EM | Admit: 2023-03-02 | Discharge: 2023-03-02 | Disposition: A | Discharge status: Home / Self Care | Payer: 59 | Attending: Emergency Medicine | Admitting: Emergency Medicine

## 2023-03-02 ENCOUNTER — Other Ambulatory Visit: Payer: Self-pay

## 2023-03-02 ENCOUNTER — Emergency Department: Payer: 59

## 2023-03-02 DIAGNOSIS — R112 Nausea with vomiting, unspecified: Secondary | ICD-10-CM | POA: Insufficient documentation

## 2023-03-02 DIAGNOSIS — R1011 Right upper quadrant pain: Secondary | ICD-10-CM | POA: Insufficient documentation

## 2023-03-02 DIAGNOSIS — R1010 Upper abdominal pain, unspecified: Secondary | ICD-10-CM

## 2023-03-02 DIAGNOSIS — I10 Essential (primary) hypertension: Secondary | ICD-10-CM | POA: Insufficient documentation

## 2023-03-02 DIAGNOSIS — E119 Type 2 diabetes mellitus without complications: Secondary | ICD-10-CM | POA: Insufficient documentation

## 2023-03-02 LAB — CBC
HCT: 36.5 % (ref 36.0–46.0)
Hemoglobin: 11.6 g/dL — ABNORMAL LOW (ref 12.0–15.0)
MCH: 25.6 pg — ABNORMAL LOW (ref 26.0–34.0)
MCHC: 31.8 g/dL (ref 30.0–36.0)
MCV: 80.6 fL (ref 80.0–100.0)
Platelets: 338 10*3/uL (ref 150–400)
RBC: 4.53 MIL/uL (ref 3.87–5.11)
RDW: 14.6 % (ref 11.5–15.5)
WBC: 8.9 10*3/uL (ref 4.0–10.5)
nRBC: 0 % (ref 0.0–0.2)

## 2023-03-02 LAB — URINALYSIS, ROUTINE W REFLEX MICROSCOPIC
Bilirubin Urine: NEGATIVE
Glucose, UA: NEGATIVE mg/dL
Hgb urine dipstick: NEGATIVE
Ketones, ur: NEGATIVE mg/dL
Leukocytes,Ua: NEGATIVE
Nitrite: NEGATIVE
Protein, ur: NEGATIVE mg/dL
Specific Gravity, Urine: 1.023 (ref 1.005–1.030)
pH: 5 (ref 5.0–8.0)

## 2023-03-02 LAB — COMPREHENSIVE METABOLIC PANEL
ALT: 34 U/L (ref 0–44)
AST: 35 U/L (ref 15–41)
Albumin: 4.4 g/dL (ref 3.5–5.0)
Alkaline Phosphatase: 78 U/L (ref 38–126)
Anion gap: 9 (ref 5–15)
BUN: 14 mg/dL (ref 6–20)
CO2: 27 mmol/L (ref 22–32)
Calcium: 9.2 mg/dL (ref 8.9–10.3)
Chloride: 104 mmol/L (ref 98–111)
Creatinine, Ser: 0.8 mg/dL (ref 0.44–1.00)
GFR, Estimated: >60 mL/min (ref >60)
Glucose, Bld: 138 mg/dL — ABNORMAL HIGH (ref 70–99)
Potassium: 4.1 mmol/L (ref 3.5–5.1)
Sodium: 140 mmol/L (ref 135–145)
Total Bilirubin: 0.4 mg/dL (ref <1.2)
Total Protein: 8 g/dL (ref 6.5–8.1)

## 2023-03-02 LAB — POC URINE PREG, ED: Preg Test, Ur: NEGATIVE

## 2023-03-02 LAB — LIPASE, BLOOD: Lipase: 37 U/L (ref 11–51)

## 2023-03-02 MED ORDER — DICYCLOMINE HCL 10 MG PO CAPS
10.0000 mg | ORAL_CAPSULE | Freq: Once | ORAL | Status: AC
  Administered 2023-03-02: 10 mg via ORAL
  Filled 2023-03-02: qty 1

## 2023-03-02 MED ORDER — ONDANSETRON 8 MG PO TBDP: 8.0000 mg | ORAL_TABLET | Freq: Three times a day (TID) | ORAL | 0 refills | Status: DC | PRN

## 2023-03-02 MED ORDER — DICYCLOMINE HCL 10 MG PO CAPS: 10.0000 mg | ORAL_CAPSULE | Freq: Three times a day (TID) | ORAL | 0 refills | Status: AC

## 2023-03-02 MED ORDER — ONDANSETRON 4 MG PO TBDP
8.0000 mg | ORAL_TABLET | Freq: Once | ORAL | Status: AC
  Administered 2023-03-02: 8 mg via ORAL
  Filled 2023-03-02: qty 2

## 2023-03-02 NOTE — ED Triage Notes (Signed)
Pt comes with c/o belly pain since last Thursday. Pt states nausea and vomiting. Pt states across the belly and to the right side.

## 2023-03-02 NOTE — ED Notes (Signed)
Primary RN's were with critical EMS pt, provider had informed pt of discharge and she left without discharge papers.

## 2023-03-02 NOTE — ED Provider Notes (Signed)
[facility] Provider Note   Event Date/Time   First MD Initiated Contact with Patient 03/02/23 1521     (approximate) History  Abdominal Pain  HPI Susan Tanner is a 42 y.o. female with a past medical history of bipolar type I, type 2 diabetes, endometriosis, hypertension, hyperlipidemia who presents complaining of midepigastric and right upper quadrant abdominal pain that has been intermittent over the last few days and is associated with postprandial emesis.  Patient denies any symptoms similar to this in the past. ROS: Patient currently denies any vision changes, tinnitus, difficulty speaking, facial droop, sore throat, chest pain, shortness of breath, diarrhea, dysuria, or weakness/numbness/paresthesias in any extremity   Physical Exam  Triage Vital Signs: ED Triage Vitals  Encounter Vitals Group     BP 03/02/23 1350 (!) 147/103     Systolic BP Percentile --      Diastolic BP Percentile --      Pulse Rate 03/02/23 1350 76     Resp 03/02/23 1350 18     Temp 03/02/23 1350 98 F (36.7 C)     Temp src --      SpO2 03/02/23 1350 96 %     Weight 03/02/23 1349 245 lb (111.1 kg)     Height 03/02/23 1349 5\' 5"  (1.651 m)     Head Circumference --      Peak Flow --      Pain Score 03/02/23 1349 6     Pain Loc --      Pain Education --      Exclude from Growth Chart --    Most recent vital signs: Vitals:   03/02/23 1350  BP: (!) 147/103  Pulse: 76  Resp: 18  Temp: 98 F (36.7 C)  SpO2: 96%   General: Awake, oriented x4. CV:  Good peripheral perfusion.  Resp:  Normal effort.  Abd:  No distention.  Other:  Middle-aged obese Caucasian female resting comfortably in no acute distress ED Results / Procedures / Treatments  Labs (all labs ordered are listed, but only abnormal results are displayed) Labs Reviewed  COMPREHENSIVE METABOLIC PANEL - Abnormal; Notable for the following components:      Result Value   Glucose, Bld 138 (*)    All other  components within normal limits  CBC - Abnormal; Notable for the following components:   Hemoglobin 11.6 (*)    MCH 25.6 (*)    All other components within normal limits  URINALYSIS, ROUTINE W REFLEX MICROSCOPIC - Abnormal; Notable for the following components:   Color, Urine YELLOW (*)    APPearance CLEAR (*)    All other components within normal limits  LIPASE, BLOOD  POC URINE PREG, ED   RADIOLOGY ED MD interpretation: Right upper quadrant ultrasound interpreted independently shows no acute findings -Agree with radiology assessment Official radiology report(s): US ABDOMEN LIMITED RUQ (LIVER/GB)  Result Date: 03/02/2023 CLINICAL DATA:  Pain for 4 days. EXAM: ULTRASOUND ABDOMEN LIMITED RIGHT UPPER QUADRANT COMPARISON:  Abdominal ultrasound dated 05/24/2016. FINDINGS: Gallbladder: No gallstones or wall thickening visualized. No sonographic Murphy sign noted by sonographer. Common bile duct: Diameter: 3 mm Liver: No focal lesion identified. Increased parenchymal echogenicity. Portal vein is patent on color Doppler imaging with normal direction of blood flow towards the liver. Other: None. IMPRESSION: 1. No acute findings. 2. Hepatic steatosis. Electronically Signed   By: Romona Curls M.D.   On: 03/02/2023 17:21   PROCEDURES: Critical Care performed: No .1-3 Lead EKG  Interpretation  Performed by: Merwyn Katos, MD Authorized by: Merwyn Katos, MD     Interpretation: normal     ECG rate:  71   ECG rate assessment: normal     Rhythm: sinus rhythm     Ectopy: none     Conduction: normal    MEDICATIONS ORDERED IN ED: Medications  ondansetron (ZOFRAN-ODT) disintegrating tablet 8 mg (8 mg Oral Given 03/02/23 1556)  dicyclomine (BENTYL) capsule 10 mg (10 mg Oral Given 03/02/23 1556)   IMPRESSION / MDM / ASSESSMENT AND PLAN / ED COURSE  I reviewed the triage vital signs and the nursing notes.                             The patient is on the cardiac monitor to evaluate for  evidence of arrhythmia and/or significant heart rate changes. Patient's presentation is most consistent with acute presentation with potential threat to life or bodily function. Patient's symptoms not typical for emergent causes of abdominal pain such as, but not limited to, appendicitis, abdominal aortic aneurysm, surgical biliary disease, pancreatitis, SBO, mesenteric ischemia, serious intra-abdominal bacterial illness. Presentation also not typical of gynecologic emergencies such as TOA, Ovarian Torsion, PID. Not Ectopic. Doubt atypical ACS.  Pt tolerating PO. Disposition: Patient will be discharged with strict return precautions and follow up with primary MD within 12-24 hours for further evaluation. Patient understands that this still may have an early presentation of an emergent medical condition such as appendicitis that will require a recheck.   FINAL CLINICAL IMPRESSION(S) / ED DIAGNOSES   Final diagnoses:  Pain of upper abdomen  Nausea and vomiting, unspecified vomiting type   Rx / DC Orders   ED Discharge Orders          Ordered    ondansetron (ZOFRAN-ODT) 8 MG disintegrating tablet  Every 8 hours PRN        03/02/23 1754    dicyclomine (BENTYL) 10 MG capsule  3 times daily before meals & bedtime        03/02/23 1754           Note:  This document was prepared using Dragon voice recognition software and may include unintentional dictation errors.   Merwyn Katos, MD 03/02/23 [phone]

## 2023-03-03 MED ORDER — BELSOMRA 10 MG PO TABS: 10.0000 mg | ORAL_TABLET | Freq: Every evening | ORAL | 0 refills | Status: AC | PRN

## 2023-03-11 NOTE — Telephone Encounter (Signed)
Could you contact her to reschedule based on her request? Thanks.
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
## 2023-03-17 ENCOUNTER — Ambulatory Visit: Payer: 59 | Admitting: Psychiatry

## 2023-03-29 ENCOUNTER — Other Ambulatory Visit: Payer: Self-pay | Admitting: Psychiatry

## 2023-03-29 NOTE — Telephone Encounter (Signed)
The refill has been ordered as requested. Please contact the patient to schedule a follow-up visit.

## 2023-03-31 NOTE — Telephone Encounter (Signed)
Scheduled and on wait list.

## 2023-04-02 ENCOUNTER — Other Ambulatory Visit: Payer: Self-pay | Admitting: Psychiatry

## 2023-04-15 ENCOUNTER — Other Ambulatory Visit: Payer: Self-pay | Admitting: Psychiatry

## 2023-04-19 ENCOUNTER — Other Ambulatory Visit: Payer: Self-pay | Admitting: Psychiatry

## 2023-04-19 MED ORDER — BELSOMRA 15 MG PO TABS: 15.0000 mg | ORAL_TABLET | Freq: Every evening | ORAL | 0 refills | Status: DC | PRN

## 2023-04-24 ENCOUNTER — Encounter: Payer: Self-pay | Admitting: Psychiatry

## 2023-04-24 ENCOUNTER — Ambulatory Visit (INDEPENDENT_AMBULATORY_CARE_PROVIDER_SITE_OTHER): Payer: 59 | Admitting: Psychiatry

## 2023-04-24 VITALS — BP 122/84 | HR 75 | Temp 98.7°F | Ht 65.0 in | Wt 233.0 lb

## 2023-04-24 DIAGNOSIS — F4310 Post-traumatic stress disorder, unspecified: Secondary | ICD-10-CM | POA: Diagnosis not present

## 2023-04-24 DIAGNOSIS — G4700 Insomnia, unspecified: Secondary | ICD-10-CM | POA: Diagnosis not present

## 2023-04-24 DIAGNOSIS — F259 Schizoaffective disorder, unspecified: Secondary | ICD-10-CM | POA: Diagnosis not present

## 2023-04-24 MED ORDER — LURASIDONE HCL 120 MG PO TABS: 1.0000 | ORAL_TABLET | Freq: Every day | ORAL | 1 refills | Status: DC

## 2023-04-24 NOTE — Patient Instructions (Signed)
 Continue sertraline 200 mg at night Continue Latuda 120 mg at night  Continue hydroxyzine 25 mg as needed at night  for anxiety Continue Belsomra 15 mg at night as needed for insomnia Next appointment- 2/17 at 11 AM
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
## 2023-05-15 ENCOUNTER — Ambulatory Visit: Payer: 59 | Admitting: Psychiatry

## 2023-05-23 ENCOUNTER — Other Ambulatory Visit: Payer: Self-pay | Admitting: Psychiatry

## 2023-06-03 NOTE — Telephone Encounter (Signed)
Called to make patient aware that the app was incorrect I actually spoke to the pharmacy and she does have refills on file and she should contact the pharmacy to see when they would have it available for her
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
## 2023-06-10 ENCOUNTER — Ambulatory Visit (INDEPENDENT_AMBULATORY_CARE_PROVIDER_SITE_OTHER): Payer: 59 | Admitting: Psychiatry

## 2023-06-10 ENCOUNTER — Encounter: Payer: Self-pay | Admitting: Psychiatry

## 2023-06-10 VITALS — BP 113/76 | HR 74 | Temp 96.3°F | Ht 65.0 in | Wt 235.0 lb

## 2023-06-10 DIAGNOSIS — F4310 Post-traumatic stress disorder, unspecified: Secondary | ICD-10-CM | POA: Diagnosis not present

## 2023-06-10 DIAGNOSIS — E611 Iron deficiency: Secondary | ICD-10-CM | POA: Diagnosis not present

## 2023-06-10 DIAGNOSIS — R5382 Chronic fatigue, unspecified: Secondary | ICD-10-CM

## 2023-06-10 DIAGNOSIS — F259 Schizoaffective disorder, unspecified: Secondary | ICD-10-CM

## 2023-06-10 DIAGNOSIS — G4700 Insomnia, unspecified: Secondary | ICD-10-CM

## 2023-06-10 MED ORDER — BELSOMRA 15 MG PO TABS: 15.0000 mg | ORAL_TABLET | Freq: Every evening | ORAL | 1 refills | Status: DC | PRN

## 2023-06-10 MED ORDER — SERTRALINE HCL 100 MG PO TABS
200.0000 mg | ORAL_TABLET | Freq: Every day | ORAL | 0 refills | Status: DC
Start: 2023-07-01 — End: 2023-10-13

## 2023-06-10 MED ORDER — HYDROXYZINE PAMOATE 25 MG PO CAPS: 25.0000 mg | ORAL_CAPSULE | Freq: Every day | ORAL | 1 refills | Status: DC | PRN

## 2023-06-10 NOTE — Patient Instructions (Signed)
Continue sertraline 200 mg at night Continue Latuda 120 mg at night  Continue hydroxyzine 25 mg as needed at night  for anxiety Continue Belsomra 15 mg at night as needed for insomnia Start ferrous sulfate 65 mg daily Next appointment- 4/7 at 2:30

## 2023-07-22 ENCOUNTER — Other Ambulatory Visit: Payer: Self-pay | Admitting: Obstetrics and Gynecology

## 2023-07-22 DIAGNOSIS — Z1231 Encounter for screening mammogram for malignant neoplasm of breast: Secondary | ICD-10-CM
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
## 2023-07-28 ENCOUNTER — Encounter: Payer: Self-pay | Admitting: Psychiatry

## 2023-07-28 ENCOUNTER — Ambulatory Visit (INDEPENDENT_AMBULATORY_CARE_PROVIDER_SITE_OTHER): Payer: 59 | Admitting: Psychiatry

## 2023-07-28 VITALS — BP 118/82 | HR 86 | Temp 98.0°F | Ht 65.0 in | Wt 236.2 lb

## 2023-07-28 DIAGNOSIS — G4700 Insomnia, unspecified: Secondary | ICD-10-CM

## 2023-07-28 DIAGNOSIS — F419 Anxiety disorder, unspecified: Secondary | ICD-10-CM

## 2023-07-28 DIAGNOSIS — F259 Schizoaffective disorder, unspecified: Secondary | ICD-10-CM

## 2023-07-28 DIAGNOSIS — F4310 Post-traumatic stress disorder, unspecified: Secondary | ICD-10-CM | POA: Diagnosis not present

## 2023-07-28 DIAGNOSIS — F1290 Cannabis use, unspecified, uncomplicated: Secondary | ICD-10-CM | POA: Diagnosis not present

## 2023-07-28 MED ORDER — LURASIDONE HCL 120 MG PO TABS: 1.0000 | ORAL_TABLET | Freq: Every day | ORAL | 1 refills | Status: DC

## 2023-07-28 NOTE — Patient Instructions (Signed)
 Continue sertraline 200 mg at night Continue Latuda 120 mg at night  Continue hydroxyzine 25 mg as needed at night  for anxiety Continue Belsomra 15 mg at night as needed for insomnia Continue ferrous sulfate 325 mg daily Next appointment- 5/15 at 2 :30

## 2023-08-07 ENCOUNTER — Inpatient Hospital Stay
Admission: RE | Admit: 2023-08-07 | Discharge: 2023-08-07 | Disposition: A | Discharge status: Home / Self Care | Payer: Self-pay | Source: Ambulatory Visit | Attending: Nurse Practitioner | Admitting: Nurse Practitioner

## 2023-08-07 ENCOUNTER — Other Ambulatory Visit: Payer: Self-pay | Admitting: *Deleted

## 2023-08-07 DIAGNOSIS — Z1231 Encounter for screening mammogram for malignant neoplasm of breast: Secondary | ICD-10-CM

## 2023-08-08 ENCOUNTER — Ambulatory Visit
Admission: RE | Admit: 2023-08-08 | Discharge: 2023-08-08 | Disposition: A | Discharge status: Home / Self Care | Source: Ambulatory Visit | Attending: Obstetrics and Gynecology | Admitting: Obstetrics and Gynecology

## 2023-08-08 DIAGNOSIS — Z1231 Encounter for screening mammogram for malignant neoplasm of breast: Secondary | ICD-10-CM | POA: Diagnosis present

## 2023-08-26 ENCOUNTER — Encounter: Payer: Self-pay | Admitting: Psychiatry

## 2023-08-26 ENCOUNTER — Telehealth (INDEPENDENT_AMBULATORY_CARE_PROVIDER_SITE_OTHER): Admitting: Psychiatry

## 2023-08-26 DIAGNOSIS — F4310 Post-traumatic stress disorder, unspecified: Secondary | ICD-10-CM | POA: Diagnosis not present

## 2023-08-26 DIAGNOSIS — F419 Anxiety disorder, unspecified: Secondary | ICD-10-CM

## 2023-08-26 DIAGNOSIS — E611 Iron deficiency: Secondary | ICD-10-CM

## 2023-08-26 DIAGNOSIS — F259 Schizoaffective disorder, unspecified: Secondary | ICD-10-CM | POA: Diagnosis not present

## 2023-08-26 MED ORDER — VENLAFAXINE HCL ER 37.5 MG PO CP24: ORAL_CAPSULE | ORAL | 0 refills | Status: DC

## 2023-08-26 NOTE — Patient Instructions (Signed)
 Taper sertraline  by decreasing the dose by 50 mg each week until discontinued  Start venlafaxine 37.5 mg daily for one week, then 75 mg daily  Continue Latuda  120 mg at night  Continue hydroxyzine  25 mg as needed at night  for anxiety Continue ferrous sulfate 325 mg daily Next appointment- 5/15 at 2 :30
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
## 2023-09-04 ENCOUNTER — Other Ambulatory Visit: Payer: Self-pay

## 2023-09-04 ENCOUNTER — Encounter: Payer: Self-pay | Admitting: Psychiatry

## 2023-09-04 ENCOUNTER — Ambulatory Visit (INDEPENDENT_AMBULATORY_CARE_PROVIDER_SITE_OTHER): Admitting: Psychiatry

## 2023-09-04 VITALS — BP 121/83 | HR 91 | Temp 97.8°F | Ht 65.0 in | Wt 242.8 lb

## 2023-09-04 DIAGNOSIS — F259 Schizoaffective disorder, unspecified: Secondary | ICD-10-CM

## 2023-09-04 DIAGNOSIS — F419 Anxiety disorder, unspecified: Secondary | ICD-10-CM | POA: Diagnosis not present

## 2023-09-04 DIAGNOSIS — F4310 Post-traumatic stress disorder, unspecified: Secondary | ICD-10-CM

## 2023-09-04 MED ORDER — VENLAFAXINE HCL ER 150 MG PO CP24: 150.0000 mg | ORAL_CAPSULE | Freq: Every day | ORAL | 1 refills | Status: DC

## 2023-09-04 NOTE — Patient Instructions (Signed)
 Increase venlafaxine  75 mg daily for one week, then 150 mg daily  Continue sertraline  50 mg at night for one week, then discontinue Continue Latuda  120 mg at night  Continue hydroxyzine  25 mg as needed at night  for anxiety Continue ferrous sulfate 325 mg daily Next appointment-  6/23 at 2:30

## 2023-09-17 ENCOUNTER — Other Ambulatory Visit (INDEPENDENT_AMBULATORY_CARE_PROVIDER_SITE_OTHER): Payer: Self-pay | Admitting: Psychiatry

## 2023-09-17 DIAGNOSIS — F411 Generalized anxiety disorder: Secondary | ICD-10-CM | POA: Diagnosis not present

## 2023-09-17 MED ORDER — ALPRAZOLAM 0.5 MG PO TABS: 0.5000 mg | ORAL_TABLET | Freq: Every evening | ORAL | 0 refills | Status: AC | PRN

## 2023-09-17 NOTE — Telephone Encounter (Signed)
I've spent a total time of 7 minutes providing service to this patient-generated inquiry in the MyChart message  

## 2023-09-26 ENCOUNTER — Other Ambulatory Visit: Payer: Self-pay | Admitting: Psychiatry
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
## 2023-10-13 ENCOUNTER — Telehealth (INDEPENDENT_AMBULATORY_CARE_PROVIDER_SITE_OTHER): Admitting: Psychiatry

## 2023-10-13 ENCOUNTER — Encounter: Payer: Self-pay | Admitting: Psychiatry

## 2023-10-13 DIAGNOSIS — F4310 Post-traumatic stress disorder, unspecified: Secondary | ICD-10-CM | POA: Diagnosis not present

## 2023-10-13 DIAGNOSIS — F419 Anxiety disorder, unspecified: Secondary | ICD-10-CM

## 2023-10-13 DIAGNOSIS — F259 Schizoaffective disorder, unspecified: Secondary | ICD-10-CM | POA: Diagnosis not present

## 2023-10-13 DIAGNOSIS — G4700 Insomnia, unspecified: Secondary | ICD-10-CM

## 2023-10-13 NOTE — Patient Instructions (Signed)
 Continue venlafaxine  150 mg daily  Continue Latuda  120 mg at night  Continue hydroxyzine  25 mg as needed at night  for anxiety Continue ferrous sulfate 325 mg daily Hold xanax  Next appointment-  8/6 at 4 pm

## 2023-10-16 ENCOUNTER — Other Ambulatory Visit: Payer: Self-pay | Admitting: Psychiatry

## 2023-10-27 ENCOUNTER — Other Ambulatory Visit: Payer: Self-pay

## 2023-10-27 ENCOUNTER — Emergency Department

## 2023-10-27 ENCOUNTER — Encounter: Payer: Self-pay | Admitting: Emergency Medicine

## 2023-10-27 ENCOUNTER — Emergency Department
Admission: EM | Admit: 2023-10-27 | Discharge: 2023-10-27 | Disposition: A | Discharge status: Home / Self Care | Attending: Emergency Medicine | Admitting: Emergency Medicine

## 2023-10-27 DIAGNOSIS — B354 Tinea corporis: Secondary | ICD-10-CM | POA: Insufficient documentation

## 2023-10-27 DIAGNOSIS — M79605 Pain in left leg: Secondary | ICD-10-CM | POA: Insufficient documentation

## 2023-10-27 MED ORDER — CLOTRIMAZOLE 1 % EX CREA: 1.0000 | TOPICAL_CREAM | Freq: Two times a day (BID) | CUTANEOUS | 0 refills | Status: AC

## 2023-10-27 NOTE — ED Triage Notes (Signed)
 Patient to ED via POV for left leg pain. Started on Friday. Takes she feels knots in legs. Denies swelling or redness. Also, has rash on left arm x1 week.
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
## 2023-10-27 NOTE — Discharge Instructions (Addendum)
 There is no blood clot on your ultrasound.  You may apply the ointment to your rash on your arm.  Please follow-up with your outpatient provider.  Please return for any new, worsening, or change in symptoms or other concerns.

## 2023-11-16 ENCOUNTER — Other Ambulatory Visit: Payer: Self-pay | Admitting: Psychiatry
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
## 2023-11-24 ENCOUNTER — Telehealth: Admitting: Psychiatry

## 2023-11-24 ENCOUNTER — Encounter: Payer: Self-pay | Admitting: Psychiatry

## 2023-11-24 DIAGNOSIS — E611 Iron deficiency: Secondary | ICD-10-CM

## 2023-11-24 DIAGNOSIS — F259 Schizoaffective disorder, unspecified: Secondary | ICD-10-CM

## 2023-11-24 DIAGNOSIS — F4310 Post-traumatic stress disorder, unspecified: Secondary | ICD-10-CM | POA: Diagnosis not present

## 2023-11-24 DIAGNOSIS — G4700 Insomnia, unspecified: Secondary | ICD-10-CM

## 2023-11-24 DIAGNOSIS — F419 Anxiety disorder, unspecified: Secondary | ICD-10-CM | POA: Diagnosis not present

## 2023-11-24 DIAGNOSIS — F50819 Binge eating disorder, unspecified: Secondary | ICD-10-CM

## 2023-11-24 DIAGNOSIS — F1290 Cannabis use, unspecified, uncomplicated: Secondary | ICD-10-CM

## 2023-11-24 MED ORDER — VENLAFAXINE HCL ER 150 MG PO CP24
150.0000 mg | ORAL_CAPSULE | Freq: Every day | ORAL | 0 refills | Status: DC
Start: 2023-12-29 — End: 2024-03-04

## 2023-11-24 MED ORDER — VENLAFAXINE HCL ER 37.5 MG PO CP24: ORAL_CAPSULE | ORAL | 1 refills | Status: DC

## 2023-12-09 ENCOUNTER — Other Ambulatory Visit: Payer: Self-pay

## 2023-12-09 ENCOUNTER — Emergency Department
Admission: EM | Admit: 2023-12-09 | Discharge: 2023-12-09 | Disposition: A | Discharge status: Home / Self Care | Attending: Emergency Medicine | Admitting: Emergency Medicine

## 2023-12-09 DIAGNOSIS — M545 Low back pain, unspecified: Secondary | ICD-10-CM | POA: Diagnosis present

## 2023-12-09 DIAGNOSIS — I10 Essential (primary) hypertension: Secondary | ICD-10-CM | POA: Diagnosis not present

## 2023-12-09 DIAGNOSIS — E119 Type 2 diabetes mellitus without complications: Secondary | ICD-10-CM | POA: Diagnosis not present

## 2023-12-09 DIAGNOSIS — J4520 Mild intermittent asthma, uncomplicated: Secondary | ICD-10-CM | POA: Diagnosis not present

## 2023-12-09 MED ORDER — LIDOCAINE 5 % EX PTCH
1.0000 | MEDICATED_PATCH | CUTANEOUS | Status: DC
  Administered 2023-12-09: 1 via TRANSDERMAL
  Filled 2023-12-09: qty 1

## 2023-12-09 MED ORDER — CYCLOBENZAPRINE HCL 10 MG PO TABS
5.0000 mg | ORAL_TABLET | Freq: Once | ORAL | Status: AC
  Administered 2023-12-09: 5 mg via ORAL
  Filled 2023-12-09: qty 1

## 2023-12-09 MED ORDER — CYCLOBENZAPRINE HCL 5 MG PO TABS: 5.0000 mg | ORAL_TABLET | Freq: Every day | ORAL | 0 refills | Status: AC

## 2023-12-09 MED ORDER — LIDOCAINE 5 % EX PTCH: 1.0000 | MEDICATED_PATCH | Freq: Two times a day (BID) | CUTANEOUS | 0 refills | Status: AC

## 2023-12-09 MED ORDER — NAPROXEN 500 MG PO TABS: 500.0000 mg | ORAL_TABLET | Freq: Two times a day (BID) | ORAL | 0 refills | Status: AC

## 2023-12-09 MED ORDER — KETOROLAC TROMETHAMINE 15 MG/ML IJ SOLN
15.0000 mg | Freq: Once | INTRAMUSCULAR | Status: AC
  Administered 2023-12-09: 15 mg via INTRAMUSCULAR
  Filled 2023-12-09: qty 1

## 2023-12-09 NOTE — ED Notes (Signed)
 See triage note  Presents with lower back pain  States she developed pain when lifting heavy boxes Denies any fall Ambulates well to treatment room

## 2023-12-09 NOTE — ED Triage Notes (Signed)
 Pt lifted some heavy boxes at work yesterday and then began having lower back pain last night. Pain is sharp, aching and burning. Worse with sitting. Pt is ambulatory.
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
## 2023-12-09 NOTE — Discharge Instructions (Signed)
 The medications to help with your pain.  Member that the muscle relaxant, Flexeril  will make you very sleepy, so do not drive, operate heavy machinery, or perform any tasks that require concentration while taking this medication. Please return to the emergency department for any new, worsening, or changing symptoms or other concerns including weakness in your legs, urinary or stool incontinence or retention, numbness or tingling in your extremities/buttocks/groin, fevers, or any other concerns or change in symptoms.

## 2023-12-15 ENCOUNTER — Emergency Department: Admission: EM | Admit: 2023-12-15 | Discharge: 2023-12-15 | Disposition: A | Discharge status: Home / Self Care

## 2023-12-21 ENCOUNTER — Other Ambulatory Visit: Payer: Self-pay | Admitting: Psychiatry
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
## 2024-01-07 ENCOUNTER — Encounter: Payer: Self-pay | Admitting: Psychiatry

## 2024-01-07 ENCOUNTER — Telehealth: Admitting: Psychiatry

## 2024-01-07 DIAGNOSIS — F419 Anxiety disorder, unspecified: Secondary | ICD-10-CM | POA: Diagnosis not present

## 2024-01-07 DIAGNOSIS — F1290 Cannabis use, unspecified, uncomplicated: Secondary | ICD-10-CM

## 2024-01-07 DIAGNOSIS — G4700 Insomnia, unspecified: Secondary | ICD-10-CM | POA: Diagnosis not present

## 2024-01-07 DIAGNOSIS — F4310 Post-traumatic stress disorder, unspecified: Secondary | ICD-10-CM | POA: Diagnosis not present

## 2024-01-07 DIAGNOSIS — E611 Iron deficiency: Secondary | ICD-10-CM

## 2024-01-07 DIAGNOSIS — F259 Schizoaffective disorder, unspecified: Secondary | ICD-10-CM | POA: Diagnosis not present

## 2024-01-07 MED ORDER — LURASIDONE HCL 120 MG PO TABS: 1.0000 | ORAL_TABLET | Freq: Every day | ORAL | 1 refills | Status: AC

## 2024-01-07 NOTE — Patient Instructions (Signed)
 Increase venlafaxine  187.5 mg daily  Continue Latuda  120 mg at night  Continue hydroxyzine  25 mg as needed at night for anxiety Continue ferrous sulfate 325 mg daily Next appointment-  11/13 at 1 pm

## 2024-02-10 ENCOUNTER — Other Ambulatory Visit: Payer: Self-pay

## 2024-02-10 ENCOUNTER — Other Ambulatory Visit: Payer: Self-pay | Admitting: Psychiatry

## 2024-02-10 ENCOUNTER — Emergency Department

## 2024-02-10 ENCOUNTER — Encounter: Payer: Self-pay | Admitting: Emergency Medicine

## 2024-02-10 ENCOUNTER — Emergency Department
Admission: EM | Admit: 2024-02-10 | Discharge: 2024-02-10 | Disposition: A | Discharge status: Home / Self Care | Attending: Emergency Medicine | Admitting: Emergency Medicine

## 2024-02-10 DIAGNOSIS — I10 Essential (primary) hypertension: Secondary | ICD-10-CM | POA: Diagnosis not present

## 2024-02-10 DIAGNOSIS — E119 Type 2 diabetes mellitus without complications: Secondary | ICD-10-CM | POA: Diagnosis not present

## 2024-02-10 DIAGNOSIS — R1010 Upper abdominal pain, unspecified: Secondary | ICD-10-CM | POA: Diagnosis present

## 2024-02-10 DIAGNOSIS — R1011 Right upper quadrant pain: Secondary | ICD-10-CM | POA: Insufficient documentation

## 2024-02-10 LAB — HEPATIC FUNCTION PANEL
ALT: 34 U/L (ref 0–44)
AST: 30 U/L (ref 15–41)
Albumin: 4 g/dL (ref 3.5–5.0)
Alkaline Phosphatase: 67 U/L (ref 38–126)
Bilirubin, Direct: <0.1 mg/dL (ref 0.0–0.2)
Total Bilirubin: 0.4 mg/dL (ref 0.0–1.2)
Total Protein: 7.9 g/dL (ref 6.5–8.1)

## 2024-02-10 LAB — URINALYSIS, ROUTINE W REFLEX MICROSCOPIC
Bilirubin Urine: NEGATIVE
Glucose, UA: NEGATIVE mg/dL
Hgb urine dipstick: NEGATIVE
Ketones, ur: NEGATIVE mg/dL
Leukocytes,Ua: NEGATIVE
Nitrite: NEGATIVE
Protein, ur: NEGATIVE mg/dL
Specific Gravity, Urine: 1.027 (ref 1.005–1.030)
pH: 6 (ref 5.0–8.0)

## 2024-02-10 LAB — BASIC METABOLIC PANEL WITH GFR
Anion gap: 11 (ref 5–15)
BUN: 12 mg/dL (ref 6–20)
CO2: 28 mmol/L (ref 22–32)
Calcium: 9.4 mg/dL (ref 8.9–10.3)
Chloride: 100 mmol/L (ref 98–111)
Creatinine, Ser: 0.82 mg/dL (ref 0.44–1.00)
GFR, Estimated: >60 mL/min (ref >60)
Glucose, Bld: 123 mg/dL — ABNORMAL HIGH (ref 70–99)
Potassium: 3.5 mmol/L (ref 3.5–5.1)
Sodium: 139 mmol/L (ref 135–145)

## 2024-02-10 LAB — CBC
HCT: 37.3 % (ref 36.0–46.0)
Hemoglobin: 12.4 g/dL (ref 12.0–15.0)
MCH: 27.6 pg (ref 26.0–34.0)
MCHC: 33.2 g/dL (ref 30.0–36.0)
MCV: 83.1 fL (ref 80.0–100.0)
Platelets: 364 K/uL (ref 150–400)
RBC: 4.49 MIL/uL (ref 3.87–5.11)
RDW: 13.2 % (ref 11.5–15.5)
WBC: 9.4 K/uL (ref 4.0–10.5)
nRBC: 0 % (ref 0.0–0.2)

## 2024-02-10 LAB — LIPASE, BLOOD: Lipase: 37 U/L (ref 11–51)

## 2024-02-10 MED ORDER — MORPHINE SULFATE (PF) 4 MG/ML IV SOLN
4.0000 mg | Freq: Once | INTRAVENOUS | Status: AC
  Administered 2024-02-10: 4 mg via INTRAVENOUS
  Filled 2024-02-10: qty 1

## 2024-02-10 MED ORDER — IOHEXOL 300 MG/ML  SOLN
100.0000 mL | Freq: Once | INTRAMUSCULAR | Status: AC | PRN
  Administered 2024-02-10: 100 mL via INTRAVENOUS

## 2024-02-10 MED ORDER — ONDANSETRON HCL 4 MG/2ML IJ SOLN
4.0000 mg | Freq: Once | INTRAMUSCULAR | Status: AC
  Administered 2024-02-10: 4 mg via INTRAVENOUS
  Filled 2024-02-10: qty 2

## 2024-02-10 MED ORDER — SODIUM CHLORIDE 0.9 % IV BOLUS
1000.0000 mL | Freq: Once | INTRAVENOUS | Status: AC
  Administered 2024-02-10: 1000 mL via INTRAVENOUS

## 2024-02-10 MED ORDER — ONDANSETRON 4 MG PO TBDP: 4.0000 mg | ORAL_TABLET | Freq: Three times a day (TID) | ORAL | 0 refills | Status: AC | PRN

## 2024-02-10 NOTE — Discharge Instructions (Signed)
 Follow-up with your regular doctor.  Take the Zofran  as needed.  You can alternate Tylenol  and ibuprofen  for pain if needed.

## 2024-02-10 NOTE — ED Provider Notes (Signed)
 [facility] Provider Note    Event Date/Time   First MD Initiated Contact with Patient 02/10/24 1404     (approximate)   History   Flank Pain   HPI  Susan Tanner is a 43 y.o. female history of diabetes, hypertension, hyperlipidemia, and multiple other chronic medical problems, see past medical history presents emergency department with abdominal pain, worse after eating, diarrhea and nausea.  No actual vomiting.  Patient does not have an appendix.  She does still have her gallbladder.  Denies fever or chills.  No dysuria.  No dark urine.  Symptoms started yesterday      Physical Exam   Triage Vital Signs: ED Triage Vitals  Encounter Vitals Group     BP 02/10/24 1227 118/84     Girls Systolic BP Percentile --      Girls Diastolic BP Percentile --      Boys Systolic BP Percentile --      Boys Diastolic BP Percentile --      Pulse Rate 02/10/24 1227 89     Resp 02/10/24 1227 20     Temp 02/10/24 1227 98.3 F (36.8 C)     Temp Source 02/10/24 1227 Oral     SpO2 02/10/24 1227 100 %     Weight 02/10/24 1226 235 lb (106.6 kg)     Height 02/10/24 1226 5' 5 (1.651 m)     Head Circumference --      Peak Flow --      Pain Score 02/10/24 1236 7     Pain Loc --      Pain Education --      Exclude from Growth Chart --     Most recent vital signs: Vitals:   02/10/24 1227 02/10/24 1700  BP: 118/84 105/67  Pulse: 89 95  Resp: 20 16  Temp: 98.3 F (36.8 C) 98.9 F (37.2 C)  SpO2: 100% 98%     General: Awake, no distress.   CV:  Good peripheral perfusion. regular rate and  rhythm Resp:  Normal effort. Lungs cta Abd:  No distention.  Tender in the epigastric and right upper quadrant Other:      ED Results / Procedures / Treatments   Labs (all labs ordered are listed, but only abnormal results are displayed) Labs Reviewed  URINALYSIS, ROUTINE W REFLEX MICROSCOPIC - Abnormal; Notable for the following components:      Result Value   Color,  Urine YELLOW (*)    APPearance HAZY (*)    All other components within normal limits  BASIC METABOLIC PANEL WITH GFR - Abnormal; Notable for the following components:   Glucose, Bld 123 (*)    All other components within normal limits  CBC  HEPATIC FUNCTION PANEL  LIPASE, BLOOD     EKG     RADIOLOGY Ultrasound right upper quadrant    PROCEDURES:   Procedures  Critical Care:  no Chief Complaint  Patient presents with   Flank Pain      MEDICATIONS ORDERED IN ED: Medications  sodium chloride  0.9 % bolus 1,000 mL (0 mLs Intravenous Stopped 02/10/24 1708)  ondansetron  (ZOFRAN ) injection 4 mg (4 mg Intravenous Given 02/10/24 1444)  morphine  (PF) 4 MG/ML injection 4 mg (4 mg Intravenous Given 02/10/24 1444)  morphine  (PF) 4 MG/ML injection 4 mg (4 mg Intravenous Given 02/10/24 1620)  iohexol (OMNIPAQUE) 300 MG/ML solution 100 mL (100 mLs Intravenous Contrast Given 02/10/24 1715)     IMPRESSION /  MDM / ASSESSMENT AND PLAN / ED COURSE  I reviewed the triage vital signs and the nursing notes.                              Differential diagnosis includes, but is not limited to, gastritis, pancreatitis, acute cholecystitis, choledocholithiasis, bowel obstruction, diverticulitis, gastroenteritis, viral illness  Patient's presentation is most consistent with acute illness / injury with system symptoms.   Medications given: Morphine  4 mg IV, Zofran  4 mg IV, normal saline 1 L IV  Labs, ultrasound right upper quadrant   Labs reassuring  Ultrasound right upper quadrant independently reviewed interpreted by me as being negative for acute abnormality  CT abdomen pelvis IV contrast, independently reviewed interpreted by me as being negative for acute abnormality  I did explain all these findings to the patient.  Feel more of her abdominal pain may actually be coming from her GLP-1.  She is to call her doctor to discuss.  Prescription for Zofran  for nausea sent to her  pharmacy.  She can take Tylenol  and ibuprofen  for pain as needed.  Return emergency department if worsening.  She is in agreement treatment plan.  Discharged stable condition.   FINAL CLINICAL IMPRESSION(S) / ED DIAGNOSES   Final diagnoses:  Pain of upper abdomen     Rx / DC Orders   ED Discharge Orders          Ordered    ondansetron  (ZOFRAN -ODT) 4 MG disintegrating tablet  Every 8 hours PRN        02/10/24 1751             Note:  This document was prepared using Dragon voice recognition software and may include unintentional dictation errors.    Gasper Devere ORN, PA-C 02/10/24 1753    Willo Dunnings, MD 02/11/24 [phone]

## 2024-02-10 NOTE — ED Triage Notes (Signed)
 Patient to ED via POV for right sided flank pain. Started yesterday. Having diarrhea and nausea. Denies urinary difficulty. States she has an extra rib on that side.

## 2024-02-17 ENCOUNTER — Other Ambulatory Visit: Payer: Self-pay | Admitting: Psychiatry

## 2024-02-17 MED ORDER — VENLAFAXINE HCL ER 75 MG PO CP24: 75.0000 mg | ORAL_CAPSULE | Freq: Every day | ORAL | 0 refills | Status: DC
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
## 2024-03-04 ENCOUNTER — Encounter: Payer: Self-pay | Admitting: Psychiatry

## 2024-03-04 ENCOUNTER — Other Ambulatory Visit: Payer: Self-pay

## 2024-03-04 ENCOUNTER — Ambulatory Visit (INDEPENDENT_AMBULATORY_CARE_PROVIDER_SITE_OTHER): Admitting: Psychiatry

## 2024-03-04 VITALS — BP 110/78 | HR 88 | Ht 65.0 in | Wt 238.8 lb

## 2024-03-04 DIAGNOSIS — G4700 Insomnia, unspecified: Secondary | ICD-10-CM

## 2024-03-04 DIAGNOSIS — F4310 Post-traumatic stress disorder, unspecified: Secondary | ICD-10-CM | POA: Diagnosis not present

## 2024-03-04 DIAGNOSIS — F259 Schizoaffective disorder, unspecified: Secondary | ICD-10-CM | POA: Diagnosis not present

## 2024-03-04 DIAGNOSIS — F419 Anxiety disorder, unspecified: Secondary | ICD-10-CM | POA: Diagnosis not present

## 2024-03-04 MED ORDER — VENLAFAXINE HCL ER 75 MG PO CP24: 75.0000 mg | ORAL_CAPSULE | Freq: Every day | ORAL | 0 refills | Status: AC

## 2024-03-04 MED ORDER — VENLAFAXINE HCL ER 150 MG PO CP24: 150.0000 mg | ORAL_CAPSULE | Freq: Every day | ORAL | 0 refills | Status: AC

## 2024-03-04 NOTE — Patient Instructions (Signed)
 Continue venlafaxine  225 mg daily  Continue Latuda  120 mg at night  Continue hydroxyzine  25 mg as needed at night for anxiety Continue ferrous sulfate 325 mg daily Next appointment-  1/8 at 3 30

## 2024-03-20 ENCOUNTER — Other Ambulatory Visit: Payer: Self-pay | Admitting: Psychiatry

## 2024-04-19 NOTE — Telephone Encounter (Signed)
 Called patient to get more information no answer left voicemail for patient to return call to office

## 2024-04-19 NOTE — Telephone Encounter (Signed)
 Could you call her and inquire about what she is experiencing? If her symptoms began after starting meloxicam, please advise her to contact the provider who recommended it to address her issues. Also, please remind her to use urgent care if needed.
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
## 2024-04-29 ENCOUNTER — Encounter: Payer: Self-pay | Admitting: Psychiatry

## 2024-04-29 ENCOUNTER — Other Ambulatory Visit: Payer: Self-pay

## 2024-04-29 ENCOUNTER — Ambulatory Visit (INDEPENDENT_AMBULATORY_CARE_PROVIDER_SITE_OTHER): Admitting: Psychiatry

## 2024-04-29 VITALS — BP 110/76 | HR 81 | Temp 97.4°F | Ht 65.0 in | Wt 240.8 lb

## 2024-04-29 DIAGNOSIS — F259 Schizoaffective disorder, unspecified: Secondary | ICD-10-CM | POA: Diagnosis not present

## 2024-04-29 DIAGNOSIS — Z79899 Other long term (current) drug therapy: Secondary | ICD-10-CM

## 2024-04-29 DIAGNOSIS — F4310 Post-traumatic stress disorder, unspecified: Secondary | ICD-10-CM | POA: Diagnosis not present

## 2024-04-29 DIAGNOSIS — F419 Anxiety disorder, unspecified: Secondary | ICD-10-CM

## 2024-04-29 MED ORDER — LITHIUM CARBONATE ER 300 MG PO TBCR: 300.0000 mg | EXTENDED_RELEASE_TABLET | Freq: Every day | ORAL | 1 refills | Status: AC

## 2024-04-29 NOTE — Patient Instructions (Addendum)
 Continue venlafaxine  225 mg daily  Continue Latuda  120 mg at night  Start lithium  300 mg at night  Obtain labs five days after starting lithium  (lithium  level, CBC, TSH) at [facility] Continue hydroxyzine  25 mg as needed at night for anxiety Next appointment-  2/19 at 2:30

## 2024-05-05 ENCOUNTER — Ambulatory Visit: Payer: Self-pay | Admitting: Psychiatry

## 2024-05-05 LAB — TSH: TSH: 0.43 u[IU]/mL — ABNORMAL LOW (ref 0.450–4.500)

## 2024-05-05 NOTE — Progress Notes (Signed)
 Spoke to patient discussed lab results advise patient to pause the lithium  for now and contact her primary care physician about her thyroid  levels patient voiced understanding

## 2024-05-05 NOTE — Progress Notes (Signed)
 Please let her know that her thyroid  level is outside the normal range. Please advise to contact her primary care clinician for further evaluation, especially given her fatigue, as this may be contributing to her symptoms. Please also advise her to pause her lithium  for now, since it can affect thyroid  function.

## 2024-05-21 ENCOUNTER — Other Ambulatory Visit: Payer: Self-pay | Admitting: Psychiatry

## 2024-06-10 ENCOUNTER — Ambulatory Visit: Admitting: Psychiatry
